# Patient Record
Sex: Female | Born: 1935 | Race: White | Hispanic: No | Marital: Married | Smoking: Former smoker
Health system: Southern US, Community
[De-identification: ages and names within clinical notes are randomized; demographics above are authoritative.]

## PROBLEM LIST (undated history)

## (undated) DIAGNOSIS — M1990 Unspecified osteoarthritis, unspecified site: Secondary | ICD-10-CM

## (undated) DIAGNOSIS — K5732 Diverticulitis of large intestine without perforation or abscess without bleeding: Secondary | ICD-10-CM

## (undated) DIAGNOSIS — Z9889 Other specified postprocedural states: Secondary | ICD-10-CM

## (undated) DIAGNOSIS — Z860100 Personal history of colon polyps, unspecified: Secondary | ICD-10-CM

## (undated) DIAGNOSIS — R112 Nausea with vomiting, unspecified: Secondary | ICD-10-CM

## (undated) DIAGNOSIS — J449 Chronic obstructive pulmonary disease, unspecified: Secondary | ICD-10-CM

## (undated) DIAGNOSIS — E785 Hyperlipidemia, unspecified: Secondary | ICD-10-CM

## (undated) DIAGNOSIS — N83202 Unspecified ovarian cyst, left side: Secondary | ICD-10-CM

## (undated) DIAGNOSIS — L409 Psoriasis, unspecified: Secondary | ICD-10-CM

## (undated) DIAGNOSIS — M4120 Other idiopathic scoliosis, site unspecified: Secondary | ICD-10-CM

## (undated) DIAGNOSIS — I714 Abdominal aortic aneurysm, without rupture, unspecified: Secondary | ICD-10-CM

## (undated) DIAGNOSIS — K648 Other hemorrhoids: Secondary | ICD-10-CM

## (undated) DIAGNOSIS — J9610 Chronic respiratory failure, unspecified whether with hypoxia or hypercapnia: Secondary | ICD-10-CM

## (undated) DIAGNOSIS — E278 Other specified disorders of adrenal gland: Secondary | ICD-10-CM

## (undated) DIAGNOSIS — M810 Age-related osteoporosis without current pathological fracture: Secondary | ICD-10-CM

## (undated) DIAGNOSIS — H409 Unspecified glaucoma: Secondary | ICD-10-CM

## (undated) DIAGNOSIS — K219 Gastro-esophageal reflux disease without esophagitis: Secondary | ICD-10-CM

## (undated) DIAGNOSIS — I10 Essential (primary) hypertension: Secondary | ICD-10-CM

## (undated) DIAGNOSIS — Z515 Encounter for palliative care: Secondary | ICD-10-CM

## (undated) HISTORY — DX: Gastro-esophageal reflux disease without esophagitis: K21.9

## (undated) HISTORY — PX: HEMIARTHROPLASTY SHOULDER FRACTURE: SUR653

## (undated) HISTORY — DX: Unspecified osteoarthritis, unspecified site: M19.90

## (undated) HISTORY — DX: Abdominal aortic aneurysm, without rupture: I71.4

## (undated) HISTORY — PX: ABDOMINAL AORTIC ANEURYSM REPAIR: SUR1152

## (undated) HISTORY — PX: APPENDECTOMY: SHX54

## (undated) HISTORY — DX: Chronic obstructive pulmonary disease, unspecified: J44.9

## (undated) HISTORY — DX: Personal history of colonic polyps: Z86.010

## (undated) HISTORY — DX: Abdominal aortic aneurysm, without rupture, unspecified: I71.40

## (undated) HISTORY — DX: Personal history of colon polyps, unspecified: Z86.0100

## (undated) HISTORY — DX: Encounter for palliative care: Z51.5

## (undated) HISTORY — PX: KIDNEY STONE SURGERY: SHX686

## (undated) HISTORY — PX: ABDOMINAL HYSTERECTOMY: SHX81

## (undated) HISTORY — PX: CATARACT EXTRACTION, BILATERAL: SHX1313

## (undated) HISTORY — DX: Unspecified ovarian cyst, left side: N83.202

## (undated) HISTORY — DX: Diverticulitis of large intestine without perforation or abscess without bleeding: K57.32

## (undated) HISTORY — DX: Hyperlipidemia, unspecified: E78.5

## (undated) HISTORY — DX: Essential (primary) hypertension: I10

## (undated) HISTORY — PX: TONSILLECTOMY: SUR1361

---

## 1997-04-16 ENCOUNTER — Encounter (INDEPENDENT_AMBULATORY_CARE_PROVIDER_SITE_OTHER): Payer: Self-pay | Admitting: Internal Medicine

## 2000-05-26 ENCOUNTER — Encounter: Payer: Self-pay | Admitting: Neurosurgery

## 2000-05-26 ENCOUNTER — Ambulatory Visit (HOSPITAL_COMMUNITY): Admission: RE | Admit: 2000-05-26 | Discharge: 2000-05-26 | Payer: Self-pay | Admitting: Neurosurgery

## 2000-06-17 ENCOUNTER — Other Ambulatory Visit: Admission: RE | Admit: 2000-06-17 | Discharge: 2000-06-17 | Payer: Self-pay | Admitting: *Deleted

## 2001-03-31 ENCOUNTER — Encounter: Payer: Self-pay | Admitting: Internal Medicine

## 2003-01-12 ENCOUNTER — Encounter: Payer: Self-pay | Admitting: Orthopedic Surgery

## 2003-01-18 ENCOUNTER — Encounter: Payer: Self-pay | Admitting: Orthopedic Surgery

## 2003-01-18 ENCOUNTER — Inpatient Hospital Stay (HOSPITAL_COMMUNITY): Admission: RE | Admit: 2003-01-18 | Discharge: 2003-01-20 | Payer: Self-pay | Admitting: Orthopedic Surgery

## 2003-11-09 ENCOUNTER — Inpatient Hospital Stay (HOSPITAL_COMMUNITY): Admission: EM | Admit: 2003-11-09 | Discharge: 2003-11-14 | Payer: Self-pay

## 2004-04-17 ENCOUNTER — Encounter: Admission: RE | Admit: 2004-04-17 | Discharge: 2004-04-17 | Payer: Self-pay | Admitting: Family Medicine

## 2004-04-17 ENCOUNTER — Ambulatory Visit: Payer: Self-pay | Admitting: Family Medicine

## 2004-04-19 ENCOUNTER — Ambulatory Visit: Payer: Self-pay | Admitting: Internal Medicine

## 2004-07-22 ENCOUNTER — Ambulatory Visit: Payer: Self-pay | Admitting: Family Medicine

## 2004-08-07 ENCOUNTER — Ambulatory Visit: Payer: Self-pay | Admitting: Family Medicine

## 2004-08-14 ENCOUNTER — Ambulatory Visit: Payer: Self-pay | Admitting: Family Medicine

## 2004-09-19 ENCOUNTER — Ambulatory Visit: Payer: Self-pay | Admitting: Family Medicine

## 2004-10-14 ENCOUNTER — Ambulatory Visit: Payer: Self-pay | Admitting: Family Medicine

## 2004-11-15 ENCOUNTER — Ambulatory Visit: Payer: Self-pay | Admitting: *Deleted

## 2005-01-19 ENCOUNTER — Emergency Department (HOSPITAL_COMMUNITY): Admission: EM | Admit: 2005-01-19 | Discharge: 2005-01-19 | Payer: Self-pay | Admitting: Emergency Medicine

## 2005-01-21 ENCOUNTER — Ambulatory Visit: Payer: Self-pay | Admitting: Family Medicine

## 2005-02-10 ENCOUNTER — Ambulatory Visit: Payer: Self-pay | Admitting: Critical Care Medicine

## 2005-03-14 ENCOUNTER — Ambulatory Visit: Payer: Self-pay | Admitting: Critical Care Medicine

## 2005-03-28 ENCOUNTER — Ambulatory Visit: Payer: Self-pay | Admitting: Critical Care Medicine

## 2005-05-15 ENCOUNTER — Ambulatory Visit: Payer: Self-pay | Admitting: Critical Care Medicine

## 2005-05-21 ENCOUNTER — Ambulatory Visit: Payer: Self-pay | Admitting: Family Medicine

## 2005-06-04 ENCOUNTER — Ambulatory Visit: Payer: Self-pay

## 2005-06-18 ENCOUNTER — Ambulatory Visit: Payer: Self-pay | Admitting: Internal Medicine

## 2005-06-18 ENCOUNTER — Inpatient Hospital Stay (HOSPITAL_COMMUNITY): Admission: AD | Admit: 2005-06-18 | Discharge: 2005-06-21 | Payer: Self-pay | Admitting: Internal Medicine

## 2005-06-18 ENCOUNTER — Ambulatory Visit: Payer: Self-pay | Admitting: Critical Care Medicine

## 2005-06-18 ENCOUNTER — Ambulatory Visit: Payer: Self-pay | Admitting: Family Medicine

## 2005-07-10 ENCOUNTER — Ambulatory Visit: Payer: Self-pay | Admitting: Critical Care Medicine

## 2005-09-09 ENCOUNTER — Ambulatory Visit: Payer: Self-pay | Admitting: Family Medicine

## 2005-09-25 ENCOUNTER — Ambulatory Visit: Payer: Self-pay | Admitting: Family Medicine

## 2005-10-30 ENCOUNTER — Ambulatory Visit: Payer: Self-pay | Admitting: Family Medicine

## 2005-12-19 ENCOUNTER — Ambulatory Visit: Payer: Self-pay | Admitting: Family Medicine

## 2006-01-09 ENCOUNTER — Ambulatory Visit: Payer: Self-pay | Admitting: Family Medicine

## 2006-03-17 ENCOUNTER — Ambulatory Visit: Payer: Self-pay | Admitting: Family Medicine

## 2006-03-31 ENCOUNTER — Ambulatory Visit: Payer: Self-pay | Admitting: Family Medicine

## 2006-04-03 ENCOUNTER — Ambulatory Visit: Payer: Self-pay | Admitting: Family Medicine

## 2006-05-15 ENCOUNTER — Ambulatory Visit: Payer: Self-pay | Admitting: Family Medicine

## 2006-06-19 ENCOUNTER — Ambulatory Visit: Payer: Self-pay | Admitting: Family Medicine

## 2006-08-05 ENCOUNTER — Ambulatory Visit: Payer: Self-pay | Admitting: Family Medicine

## 2006-09-01 ENCOUNTER — Ambulatory Visit: Payer: Self-pay | Admitting: Family Medicine

## 2006-11-05 ENCOUNTER — Ambulatory Visit: Payer: Self-pay | Admitting: Family Medicine

## 2006-11-05 ENCOUNTER — Encounter (INDEPENDENT_AMBULATORY_CARE_PROVIDER_SITE_OTHER): Payer: Self-pay | Admitting: Internal Medicine

## 2006-11-05 DIAGNOSIS — M25559 Pain in unspecified hip: Secondary | ICD-10-CM

## 2006-11-05 DIAGNOSIS — R109 Unspecified abdominal pain: Secondary | ICD-10-CM

## 2006-11-06 ENCOUNTER — Telehealth (INDEPENDENT_AMBULATORY_CARE_PROVIDER_SITE_OTHER): Payer: Self-pay | Admitting: Internal Medicine

## 2006-12-28 ENCOUNTER — Telehealth (INDEPENDENT_AMBULATORY_CARE_PROVIDER_SITE_OTHER): Payer: Self-pay | Admitting: *Deleted

## 2007-01-03 ENCOUNTER — Encounter: Admission: RE | Admit: 2007-01-03 | Discharge: 2007-01-03 | Payer: Self-pay | Admitting: Orthopedic Surgery

## 2007-01-13 ENCOUNTER — Ambulatory Visit (HOSPITAL_COMMUNITY): Admission: RE | Admit: 2007-01-13 | Discharge: 2007-01-13 | Payer: Self-pay | Admitting: Obstetrics and Gynecology

## 2007-01-20 ENCOUNTER — Ambulatory Visit: Payer: Self-pay | Admitting: Vascular Surgery

## 2007-01-26 ENCOUNTER — Ambulatory Visit: Payer: Self-pay | Admitting: Vascular Surgery

## 2007-02-11 ENCOUNTER — Encounter (INDEPENDENT_AMBULATORY_CARE_PROVIDER_SITE_OTHER): Payer: Self-pay | Admitting: Internal Medicine

## 2007-03-30 ENCOUNTER — Encounter (INDEPENDENT_AMBULATORY_CARE_PROVIDER_SITE_OTHER): Payer: Self-pay | Admitting: Internal Medicine

## 2007-03-30 DIAGNOSIS — I714 Abdominal aortic aneurysm, without rupture, unspecified: Secondary | ICD-10-CM | POA: Insufficient documentation

## 2007-03-30 DIAGNOSIS — M810 Age-related osteoporosis without current pathological fracture: Secondary | ICD-10-CM | POA: Insufficient documentation

## 2007-03-30 DIAGNOSIS — J4489 Other specified chronic obstructive pulmonary disease: Secondary | ICD-10-CM | POA: Insufficient documentation

## 2007-03-30 DIAGNOSIS — H409 Unspecified glaucoma: Secondary | ICD-10-CM | POA: Insufficient documentation

## 2007-03-30 DIAGNOSIS — M4120 Other idiopathic scoliosis, site unspecified: Secondary | ICD-10-CM

## 2007-03-30 DIAGNOSIS — M1990 Unspecified osteoarthritis, unspecified site: Secondary | ICD-10-CM | POA: Insufficient documentation

## 2007-03-30 DIAGNOSIS — I10 Essential (primary) hypertension: Secondary | ICD-10-CM | POA: Insufficient documentation

## 2007-03-30 DIAGNOSIS — J449 Chronic obstructive pulmonary disease, unspecified: Secondary | ICD-10-CM

## 2007-03-30 HISTORY — DX: Unspecified glaucoma: H40.9

## 2007-03-30 HISTORY — DX: Other idiopathic scoliosis, site unspecified: M41.20

## 2007-03-30 HISTORY — DX: Age-related osteoporosis without current pathological fracture: M81.0

## 2007-03-31 ENCOUNTER — Ambulatory Visit: Payer: Self-pay | Admitting: Family Medicine

## 2007-03-31 DIAGNOSIS — L408 Other psoriasis: Secondary | ICD-10-CM

## 2007-04-05 ENCOUNTER — Ambulatory Visit: Payer: Self-pay | Admitting: Family Medicine

## 2007-04-14 ENCOUNTER — Ambulatory Visit: Payer: Self-pay | Admitting: Family Medicine

## 2007-04-22 ENCOUNTER — Encounter (INDEPENDENT_AMBULATORY_CARE_PROVIDER_SITE_OTHER): Payer: Self-pay | Admitting: Internal Medicine

## 2007-04-30 ENCOUNTER — Ambulatory Visit: Payer: Self-pay | Admitting: Family Medicine

## 2007-05-04 DIAGNOSIS — E278 Other specified disorders of adrenal gland: Secondary | ICD-10-CM

## 2007-05-04 HISTORY — DX: Other specified disorders of adrenal gland: E27.8

## 2007-05-24 ENCOUNTER — Ambulatory Visit: Payer: Self-pay | Admitting: Family Medicine

## 2007-05-24 DIAGNOSIS — M545 Low back pain: Secondary | ICD-10-CM

## 2007-05-25 ENCOUNTER — Telehealth (INDEPENDENT_AMBULATORY_CARE_PROVIDER_SITE_OTHER): Payer: Self-pay | Admitting: *Deleted

## 2007-06-01 ENCOUNTER — Ambulatory Visit: Payer: Self-pay | Admitting: Family Medicine

## 2007-06-08 ENCOUNTER — Ambulatory Visit: Payer: Self-pay | Admitting: Family Medicine

## 2007-07-12 ENCOUNTER — Encounter (INDEPENDENT_AMBULATORY_CARE_PROVIDER_SITE_OTHER): Payer: Self-pay | Admitting: *Deleted

## 2007-07-26 ENCOUNTER — Ambulatory Visit: Payer: Self-pay | Admitting: Family Medicine

## 2007-08-17 ENCOUNTER — Telehealth (INDEPENDENT_AMBULATORY_CARE_PROVIDER_SITE_OTHER): Payer: Self-pay | Admitting: Internal Medicine

## 2007-09-21 ENCOUNTER — Ambulatory Visit: Payer: Self-pay | Admitting: Family Medicine

## 2007-10-05 ENCOUNTER — Ambulatory Visit: Payer: Self-pay | Admitting: Family Medicine

## 2007-10-11 ENCOUNTER — Ambulatory Visit: Payer: Self-pay | Admitting: Family Medicine

## 2007-11-16 ENCOUNTER — Encounter (INDEPENDENT_AMBULATORY_CARE_PROVIDER_SITE_OTHER): Payer: Self-pay | Admitting: Internal Medicine

## 2007-12-03 ENCOUNTER — Telehealth (INDEPENDENT_AMBULATORY_CARE_PROVIDER_SITE_OTHER): Payer: Self-pay | Admitting: *Deleted

## 2008-01-04 ENCOUNTER — Ambulatory Visit: Payer: Self-pay | Admitting: Family Medicine

## 2008-01-05 ENCOUNTER — Telehealth (INDEPENDENT_AMBULATORY_CARE_PROVIDER_SITE_OTHER): Payer: Self-pay | Admitting: Internal Medicine

## 2008-02-08 ENCOUNTER — Ambulatory Visit: Payer: Self-pay | Admitting: Vascular Surgery

## 2008-03-06 ENCOUNTER — Ambulatory Visit: Payer: Self-pay | Admitting: Family Medicine

## 2008-03-13 ENCOUNTER — Ambulatory Visit: Payer: Self-pay | Admitting: Family Medicine

## 2008-03-21 ENCOUNTER — Telehealth: Payer: Self-pay | Admitting: Internal Medicine

## 2008-03-21 ENCOUNTER — Telehealth (INDEPENDENT_AMBULATORY_CARE_PROVIDER_SITE_OTHER): Payer: Self-pay | Admitting: Internal Medicine

## 2008-03-21 ENCOUNTER — Ambulatory Visit: Payer: Self-pay | Admitting: Family Medicine

## 2008-03-23 DIAGNOSIS — K449 Diaphragmatic hernia without obstruction or gangrene: Secondary | ICD-10-CM | POA: Insufficient documentation

## 2008-03-23 DIAGNOSIS — K648 Other hemorrhoids: Secondary | ICD-10-CM | POA: Insufficient documentation

## 2008-03-23 HISTORY — DX: Other hemorrhoids: K64.8

## 2008-03-24 ENCOUNTER — Ambulatory Visit (HOSPITAL_COMMUNITY): Admission: RE | Admit: 2008-03-24 | Discharge: 2008-03-24 | Payer: Self-pay | Admitting: Family Medicine

## 2008-03-28 ENCOUNTER — Ambulatory Visit: Payer: Self-pay | Admitting: Family Medicine

## 2008-03-28 ENCOUNTER — Ambulatory Visit: Payer: Self-pay | Admitting: Internal Medicine

## 2008-03-28 DIAGNOSIS — R1319 Other dysphagia: Secondary | ICD-10-CM

## 2008-03-28 DIAGNOSIS — H6120 Impacted cerumen, unspecified ear: Secondary | ICD-10-CM

## 2008-03-29 ENCOUNTER — Encounter (INDEPENDENT_AMBULATORY_CARE_PROVIDER_SITE_OTHER): Payer: Self-pay | Admitting: Internal Medicine

## 2008-03-29 ENCOUNTER — Ambulatory Visit: Payer: Self-pay | Admitting: Internal Medicine

## 2008-03-29 DIAGNOSIS — K2980 Duodenitis without bleeding: Secondary | ICD-10-CM | POA: Insufficient documentation

## 2008-03-29 DIAGNOSIS — K2990 Gastroduodenitis, unspecified, without bleeding: Secondary | ICD-10-CM

## 2008-03-29 DIAGNOSIS — K2970 Gastritis, unspecified, without bleeding: Secondary | ICD-10-CM | POA: Insufficient documentation

## 2008-03-29 LAB — CONVERTED CEMR LAB: UREASE: NEGATIVE

## 2008-05-05 ENCOUNTER — Encounter (INDEPENDENT_AMBULATORY_CARE_PROVIDER_SITE_OTHER): Payer: Self-pay | Admitting: Internal Medicine

## 2008-05-16 ENCOUNTER — Encounter (INDEPENDENT_AMBULATORY_CARE_PROVIDER_SITE_OTHER): Payer: Self-pay | Admitting: Internal Medicine

## 2008-05-22 ENCOUNTER — Telehealth (INDEPENDENT_AMBULATORY_CARE_PROVIDER_SITE_OTHER): Payer: Self-pay | Admitting: *Deleted

## 2008-06-19 ENCOUNTER — Telehealth: Payer: Self-pay | Admitting: Family Medicine

## 2008-07-11 ENCOUNTER — Ambulatory Visit: Payer: Self-pay | Admitting: Family Medicine

## 2008-07-17 ENCOUNTER — Telehealth: Payer: Self-pay | Admitting: Family Medicine

## 2008-07-21 ENCOUNTER — Ambulatory Visit: Payer: Self-pay | Admitting: Internal Medicine

## 2008-07-21 DIAGNOSIS — R198 Other specified symptoms and signs involving the digestive system and abdomen: Secondary | ICD-10-CM

## 2008-07-24 ENCOUNTER — Telehealth: Payer: Self-pay | Admitting: Internal Medicine

## 2008-07-25 ENCOUNTER — Ambulatory Visit (HOSPITAL_COMMUNITY): Admission: RE | Admit: 2008-07-25 | Discharge: 2008-07-25 | Payer: Self-pay | Admitting: Internal Medicine

## 2008-07-26 ENCOUNTER — Ambulatory Visit (HOSPITAL_COMMUNITY): Admission: RE | Admit: 2008-07-26 | Discharge: 2008-07-26 | Payer: Self-pay | Admitting: Internal Medicine

## 2008-07-31 ENCOUNTER — Telehealth: Payer: Self-pay | Admitting: Family Medicine

## 2008-09-07 ENCOUNTER — Telehealth (INDEPENDENT_AMBULATORY_CARE_PROVIDER_SITE_OTHER): Payer: Self-pay | Admitting: Internal Medicine

## 2008-09-26 ENCOUNTER — Ambulatory Visit: Payer: Self-pay | Admitting: Vascular Surgery

## 2008-10-02 ENCOUNTER — Telehealth (INDEPENDENT_AMBULATORY_CARE_PROVIDER_SITE_OTHER): Payer: Self-pay | Admitting: *Deleted

## 2008-10-03 ENCOUNTER — Encounter: Payer: Self-pay | Admitting: Cardiology

## 2008-10-03 ENCOUNTER — Ambulatory Visit: Payer: Self-pay

## 2008-10-04 ENCOUNTER — Ambulatory Visit: Payer: Self-pay | Admitting: Critical Care Medicine

## 2008-10-10 ENCOUNTER — Encounter: Admission: RE | Admit: 2008-10-10 | Discharge: 2008-10-10 | Payer: Self-pay | Admitting: Vascular Surgery

## 2008-10-17 ENCOUNTER — Ambulatory Visit: Payer: Self-pay | Admitting: Vascular Surgery

## 2008-10-18 ENCOUNTER — Ambulatory Visit: Payer: Self-pay | Admitting: Family Medicine

## 2008-10-20 ENCOUNTER — Ambulatory Visit: Payer: Self-pay | Admitting: Critical Care Medicine

## 2008-10-23 ENCOUNTER — Encounter: Payer: Self-pay | Admitting: Critical Care Medicine

## 2008-11-01 ENCOUNTER — Inpatient Hospital Stay (HOSPITAL_COMMUNITY): Admission: RE | Admit: 2008-11-01 | Discharge: 2008-11-04 | Payer: Self-pay | Admitting: Vascular Surgery

## 2008-11-01 ENCOUNTER — Ambulatory Visit: Payer: Self-pay | Admitting: Vascular Surgery

## 2008-11-02 ENCOUNTER — Encounter: Payer: Self-pay | Admitting: Vascular Surgery

## 2008-11-29 ENCOUNTER — Encounter: Admission: RE | Admit: 2008-11-29 | Discharge: 2008-11-29 | Payer: Self-pay | Admitting: Vascular Surgery

## 2008-12-12 ENCOUNTER — Ambulatory Visit: Payer: Self-pay | Admitting: Vascular Surgery

## 2009-01-02 ENCOUNTER — Ambulatory Visit: Payer: Self-pay | Admitting: Family Medicine

## 2009-01-03 ENCOUNTER — Ambulatory Visit: Payer: Self-pay | Admitting: Family Medicine

## 2009-01-04 ENCOUNTER — Telehealth (INDEPENDENT_AMBULATORY_CARE_PROVIDER_SITE_OTHER): Payer: Self-pay | Admitting: Internal Medicine

## 2009-03-06 ENCOUNTER — Ambulatory Visit: Payer: Self-pay | Admitting: Family Medicine

## 2009-03-06 DIAGNOSIS — L821 Other seborrheic keratosis: Secondary | ICD-10-CM | POA: Insufficient documentation

## 2009-03-08 ENCOUNTER — Ambulatory Visit: Payer: Self-pay | Admitting: Family Medicine

## 2009-03-28 ENCOUNTER — Ambulatory Visit: Payer: Self-pay | Admitting: Family Medicine

## 2009-04-06 ENCOUNTER — Encounter (INDEPENDENT_AMBULATORY_CARE_PROVIDER_SITE_OTHER): Payer: Self-pay | Admitting: Internal Medicine

## 2009-04-23 ENCOUNTER — Telehealth: Payer: Self-pay | Admitting: Family Medicine

## 2009-04-26 ENCOUNTER — Telehealth (INDEPENDENT_AMBULATORY_CARE_PROVIDER_SITE_OTHER): Payer: Self-pay | Admitting: Internal Medicine

## 2009-05-15 ENCOUNTER — Ambulatory Visit: Payer: Self-pay | Admitting: Family Medicine

## 2009-05-17 ENCOUNTER — Ambulatory Visit: Payer: Self-pay | Admitting: Family Medicine

## 2009-06-19 ENCOUNTER — Ambulatory Visit: Payer: Self-pay | Admitting: Critical Care Medicine

## 2009-06-19 DIAGNOSIS — J209 Acute bronchitis, unspecified: Secondary | ICD-10-CM

## 2009-06-29 ENCOUNTER — Emergency Department (HOSPITAL_COMMUNITY): Admission: EM | Admit: 2009-06-29 | Discharge: 2009-06-29 | Payer: Self-pay | Admitting: Emergency Medicine

## 2009-07-02 ENCOUNTER — Ambulatory Visit: Payer: Self-pay | Admitting: Family Medicine

## 2009-07-02 DIAGNOSIS — B029 Zoster without complications: Secondary | ICD-10-CM | POA: Insufficient documentation

## 2009-07-11 ENCOUNTER — Ambulatory Visit: Payer: Self-pay | Admitting: Family Medicine

## 2009-07-11 DIAGNOSIS — J0110 Acute frontal sinusitis, unspecified: Secondary | ICD-10-CM | POA: Insufficient documentation

## 2009-07-18 ENCOUNTER — Encounter: Payer: Self-pay | Admitting: Critical Care Medicine

## 2009-07-20 ENCOUNTER — Ambulatory Visit: Payer: Self-pay | Admitting: Family Medicine

## 2009-07-24 ENCOUNTER — Ambulatory Visit: Payer: Self-pay | Admitting: Family Medicine

## 2009-08-01 ENCOUNTER — Ambulatory Visit: Payer: Self-pay | Admitting: Family Medicine

## 2009-08-01 LAB — CONVERTED CEMR LAB
ALT: 13 units/L (ref 0–35)
AST: 15 units/L (ref 0–37)
Albumin: 3.8 g/dL (ref 3.5–5.2)
BUN: 23 mg/dL (ref 6–23)
Calcium: 10 mg/dL (ref 8.4–10.5)
Cholesterol, target level: 200 mg/dL
Cholesterol: 200 mg/dL (ref 0–200)
GFR calc non Af Amer: 86.41 mL/min (ref >60)
Glucose, Bld: 96 mg/dL (ref 70–99)
HDL: 77.7 mg/dL (ref >39.00)
LDL Goal: 100 mg/dL
Phosphorus: 3.7 mg/dL (ref 2.3–4.6)
Sodium: 144 meq/L (ref 135–145)
Total Bilirubin: 0.6 mg/dL (ref 0.3–1.2)
Total CHOL/HDL Ratio: 3
Total Protein: 6.4 g/dL (ref 6.0–8.3)
Triglycerides: 187 mg/dL — ABNORMAL HIGH (ref 0.0–149.0)
VLDL: 37.4 mg/dL (ref 0.0–40.0)

## 2009-08-07 ENCOUNTER — Telehealth: Payer: Self-pay | Admitting: Family Medicine

## 2009-08-21 ENCOUNTER — Encounter: Admission: RE | Admit: 2009-08-21 | Discharge: 2009-08-21 | Payer: Self-pay | Admitting: Vascular Surgery

## 2009-08-21 ENCOUNTER — Ambulatory Visit: Payer: Self-pay | Admitting: Vascular Surgery

## 2009-09-12 ENCOUNTER — Ambulatory Visit: Payer: Self-pay | Admitting: Family Medicine

## 2009-09-13 ENCOUNTER — Encounter: Payer: Self-pay | Admitting: Family Medicine

## 2009-09-26 ENCOUNTER — Encounter: Payer: Self-pay | Admitting: Critical Care Medicine

## 2009-10-06 ENCOUNTER — Encounter: Payer: Self-pay | Admitting: Critical Care Medicine

## 2009-10-29 ENCOUNTER — Telehealth: Payer: Self-pay | Admitting: Family Medicine

## 2009-10-31 ENCOUNTER — Ambulatory Visit: Payer: Self-pay | Admitting: Family Medicine

## 2009-10-31 DIAGNOSIS — R5383 Other fatigue: Secondary | ICD-10-CM

## 2009-10-31 DIAGNOSIS — R5381 Other malaise: Secondary | ICD-10-CM | POA: Insufficient documentation

## 2009-11-01 LAB — CONVERTED CEMR LAB
AST: 18 units/L (ref 0–37)
Albumin: 4 g/dL (ref 3.5–5.2)
Basophils Relative: 0.6 % (ref 0.0–3.0)
Bilirubin, Direct: 0.1 mg/dL (ref 0.0–0.3)
CO2: 29 meq/L (ref 19–32)
Calcium: 9.7 mg/dL (ref 8.4–10.5)
Chloride: 106 meq/L (ref 96–112)
Creatinine, Ser: 0.7 mg/dL (ref 0.4–1.2)
Eosinophils Absolute: 0.2 10*3/uL (ref 0.0–0.7)
Glucose, Bld: 80 mg/dL (ref 70–99)
Lymphocytes Relative: 29.1 % (ref 12.0–46.0)
Lymphs Abs: 1.7 10*3/uL (ref 0.7–4.0)
Monocytes Relative: 12.3 % — ABNORMAL HIGH (ref 3.0–12.0)
Neutro Abs: 3.2 10*3/uL (ref 1.4–7.7)
Platelets: 272 10*3/uL (ref 150.0–400.0)
Sed Rate: 8 mm/hr (ref 0–22)

## 2009-11-27 ENCOUNTER — Telehealth: Payer: Self-pay | Admitting: Family Medicine

## 2009-11-28 ENCOUNTER — Telehealth: Payer: Self-pay | Admitting: Family Medicine

## 2009-12-21 ENCOUNTER — Encounter: Payer: Self-pay | Admitting: Family Medicine

## 2009-12-31 ENCOUNTER — Ambulatory Visit: Payer: Self-pay | Admitting: Family Medicine

## 2010-01-17 ENCOUNTER — Ambulatory Visit: Admission: RE | Admit: 2010-01-17 | Discharge: 2010-01-17 | Payer: Self-pay | Admitting: Gynecologic Oncology

## 2010-02-04 ENCOUNTER — Ambulatory Visit: Payer: Self-pay | Admitting: Family Medicine

## 2010-02-11 ENCOUNTER — Encounter: Payer: Self-pay | Admitting: Family Medicine

## 2010-02-13 ENCOUNTER — Ambulatory Visit: Payer: Self-pay | Admitting: Vascular Surgery

## 2010-02-15 ENCOUNTER — Telehealth: Payer: Self-pay | Admitting: Family Medicine

## 2010-03-05 ENCOUNTER — Ambulatory Visit: Payer: Self-pay | Admitting: Family Medicine

## 2010-03-19 ENCOUNTER — Telehealth: Payer: Self-pay | Admitting: Family Medicine

## 2010-04-01 ENCOUNTER — Ambulatory Visit: Payer: Self-pay | Admitting: Family Medicine

## 2010-04-11 ENCOUNTER — Ambulatory Visit: Payer: Self-pay | Admitting: Family Medicine

## 2010-04-11 DIAGNOSIS — R079 Chest pain, unspecified: Secondary | ICD-10-CM | POA: Insufficient documentation

## 2010-04-29 ENCOUNTER — Telehealth: Payer: Self-pay | Admitting: Family Medicine

## 2010-05-02 ENCOUNTER — Encounter: Payer: Self-pay | Admitting: Family Medicine

## 2010-05-27 ENCOUNTER — Ambulatory Visit: Payer: Self-pay | Admitting: Family Medicine

## 2010-07-01 ENCOUNTER — Telehealth: Payer: Self-pay | Admitting: Family Medicine

## 2010-07-07 ENCOUNTER — Encounter: Payer: Self-pay | Admitting: Vascular Surgery

## 2010-07-12 ENCOUNTER — Ambulatory Visit: Admit: 2010-07-12 | Payer: Self-pay | Admitting: Family Medicine

## 2010-07-16 NOTE — Assessment & Plan Note (Signed)
Summary: F/U shingles   Vital Signs:  Patient profile:   77 year old female Height:      60 inches Weight:      124 pounds BMI:     24.30 Temp:     98 degrees F oral Pulse rate:   92 / minute Pulse rhythm:   regular BP sitting:   166 / 100  (left arm) Cuff size:   regular  Vitals Entered By: Delilah Shan CMA Duncan Dull) (July 24, 2009 9:41 AM) CC: follow-up visit   History of Present Illness: 77 yo here with for follow up Shingles.  Seen on 1/17 for painful, burning  rash on right  lower abdomen. Given Vatrex, which she did not take because one dose upset her stomach. Rash is now gone but has Rosenberger has episodes of sharp, tingling burning sensation on the skin. No dysuria, no nausea, no vomiting, no diarrhea. No fevers. No hematuria.  Bronchitis symptoms much improved since I saw her last week.  Current Medications (verified): 1)  Nexium 40 Mg Cpdr (Esomeprazole Magnesium) .... Take One By Mouth Once A Day 2)  Advair Diskus 250-50 Mcg/dose Misc (Fluticasone-Salmeterol) .... Use One Time A Day 3)  Calcium 500 Mg Tabs (Calcium) .... Once Weekly 4)  Centrum Silver  Tabs (Multiple Vitamins-Minerals) .... Once Weekly 5)  Ultravate 0.05 %  Oint (Halobetasol Propionate) .... As Needed 6)  Cozaar 100 Mg  Tabs (Losartan Potassium) .... One Daily 7)  Antivert 25 Mg Tabs (Meclizine Hcl) .... One Tab By Mouth Every 6 Hrs As Needed Dizziness 8)  Atrovent 0.03 % Soln (Ipratropium Bromide) .... Use One in Nebulizer Four Times Daily 9)  Garlique 400 Mg Tbec (Garlic) .[name].. 1 By Mouth Daily 10)  Lipitor 10 Mg Tabs (Atorvastatin Calcium) .[name].. 1 Daily For Cholesterol 11)  Xanax 0.5 Mg Tabs (Alprazolam) .[name].. 1 At Bedtime As Needed 12)  Vicodin 5-500 Mg Tabs (Hydrocodone-Acetaminophen) .[name].. 1 Every 4 Hrs As Needed Cough 13)  Albuterol Sulfate (2.5 Mg/41ml) 0.083% Nebu (Albuterol Sulfate) .... Use in Nebulizer Four Times Daily With Atrovent. 14)  Valtrex 1 Gm Tabs (Valacyclovir Hcl) .[name].. 1 Tab  Three Times A Day For 7 Days 15)  Percocet 5-325 Mg Tabs (Oxycodone-Acetaminophen) .[name].. 1 Tab Every 6 Hours As Needed For Pain 16)  Vicodin 5-500 Mg Tabs (Hydrocodone-Acetaminophen) .[name].. 1 Tab Every 4 Hours As Needed Cough. 17)  Prednisone 10 Mg Tabs (Prednisone) .... 5 Tabs By Mouth X 1 Day, Then 4 Tabs By Mouth X 3 Days, Then 3 Tabs By Mouth X 3 Days, 2 Tabs By Mouth 2 Days, Then 1 Tab For 1 Day.  Allergies: 1)  ! * Avelox 2)  ! Sulfa 3)  ! Biaxin 4)  ! * Vicodin Tuss 5)  ! * Contrast Dye  Review of Systems      See HPI General:  Denies chills and fever. Derm:  Denies rash.  Physical Exam  General:  alert, well-developed, well-nourished, and well-hydrated. no increased WOB. VS reviewed- afebrile and hypertensive (was normal last office visit). Lungs:  normal respiratory effort, no intercostal retractions, and no accessory muscle use. no wheezes, much improved! Heart:  normal rate, regular rhythm, and no murmur.   Skin:  Rash no longer visible on abdomen, Baney sensitive to touch Psych:  normally interactive, moderately anxious   Impression & Recommendations:  Problem # 1:  SHINGLES (ICD-053.9) Assessment Improved Rash improved, having episodes of postherpetic neuralgia. Time spent with patient 25 minutes, more than  50% of this time was spent counseling patient on different options for treating this pain.  I would like to try Gabapentin but she is very nervous about starting another medication.  Was given Hydroconde (10 pills) in January, uses it only when the pain is severe, has two pills left.  Would like to take this for pain instead.  Given that she does not abuse them and is aware of side effects, I am ok with continuing this on short term basis as she is obviously in pain.  Problem # 2:  HYPERTENSION (ICD-401.9) Assessment: Deteriorated previously controlled see (last week OV note), likely deteriorated secondary to pain.  See #1.  Advised to keep checking it at home. Her  updated medication list for this problem includes:    Cozaar 100 Mg Tabs (Losartan potassium) ..... One daily  Complete Medication List: 1)  Nexium 40 Mg Cpdr (Esomeprazole magnesium) .... Take one by mouth once a day 2)  Advair Diskus 250-50 Mcg/dose Misc (Fluticasone-salmeterol) .... Use one time a day 3)  Calcium 500 Mg Tabs (Calcium) .... Once weekly 4)  Centrum Silver Tabs (Multiple vitamins-minerals) .... Once weekly 5)  Ultravate 0.05 % Oint (Halobetasol propionate) .... As needed 6)  Cozaar 100 Mg Tabs (Losartan potassium) .... One daily 7)  Antivert 25 Mg Tabs (Meclizine hcl) .... One tab by mouth every 6 hrs as needed dizziness 8)  Atrovent 0.03 % Soln (Ipratropium bromide) .... Use one in nebulizer four times daily 9)  Garlique 400 Mg Tbec (Garlic) .[name].. 1 by mouth daily 10)  Lipitor 10 Mg Tabs (Atorvastatin calcium) .[name].. 1 daily for cholesterol 11)  Xanax 0.5 Mg Tabs (Alprazolam) .[name].. 1 at bedtime as needed 12)  Vicodin 5-500 Mg Tabs (Hydrocodone-acetaminophen) .[name].. 1 every 4 hrs as needed cough 13)  Albuterol Sulfate (2.5 Mg/35ml) 0.083% Nebu (Albuterol sulfate) .... Use in nebulizer four times daily with atrovent. 14)  Valtrex 1 Gm Tabs (Valacyclovir hcl) .[name].. 1 tab three times a day for 7 days 15)  Percocet 5-325 Mg Tabs (Oxycodone-acetaminophen) .[name].. 1 tab every 6 hours as needed for pain 16)  Vicodin 5-500 Mg Tabs (Hydrocodone-acetaminophen) .[name].. 1 tab every 4 hours as needed cough. 17)  Prednisone 10 Mg Tabs (Prednisone) .... 5 tabs by mouth x 1 day, then 4 tabs by mouth x 3 days, then 3 tabs by mouth x 3 days, 2 tabs by mouth 2 days, then 1 tab for 1 day. Prescriptions: PERCOCET 5-325 MG TABS (OXYCODONE-ACETAMINOPHEN) 1 tab every 6 hours as needed for pain  #20 x 0   Entered and Authorized by:   [name] MD   Signed by:   [name] MD on 07/24/2009   Method used:   Print then Give to Patient   RxID:   [id number]   Current Allergies (reviewed today): ! *  AVELOX ! SULFA ! BIAXIN ! * VICODIN TUSS ! * CONTRAST DYE

## 2010-07-16 NOTE — Progress Notes (Signed)
Summary: nebulizer  Phone Note Call from Patient   Caller: Ed with [facility] Summary of Call: [facility] is asking for a rx for a new nebulizer to be faxed to [facility]. The patient old nebulizer is no longer working. Fax number is [phone]. Initial call taken by: Melody Comas,  November 28, 2009 9:14 AM     Appended Document: nebulizer Rx for nebulizer faxed attention Ed to [phone].

## 2010-07-16 NOTE — Miscellaneous (Signed)
Summary: Xopenex changed to Albuterol  Medications Added ALBUTEROL SULFATE (2.5 MG/3ML) 0.083% NEBU (ALBUTEROL SULFATE) use in nebulizer four times daily with atrovent.       Clinical Lists Changes  Medications: Changed medication from XOPENEX 0.63 MG/3ML NEBU (LEVALBUTEROL HCL) Use in nebulzer three times daily to ALBUTEROL SULFATE (2.5 MG/3ML) 0.083% NEBU (ALBUTEROL SULFATE) use in nebulizer four times daily with atrovent. - Signed Rx of ALBUTEROL SULFATE (2.5 MG/3ML) 0.083% NEBU (ALBUTEROL SULFATE) use in nebulizer four times daily with atrovent.;  #120 x 4;  Signed;  Entered by: Gweneth Dimitri RN;  Authorized by: Storm Frisk MD;  Method used: Electronically to [facility] [id number], [place], [place], [place]  [address], Ph: [phone], Fax: [phone]    Prescriptions: ALBUTEROL SULFATE (2.5 MG/3ML) 0.083% NEBU (ALBUTEROL SULFATE) use in nebulizer four times daily with atrovent.  #120 x 4   Entered by:   Gweneth Dimitri RN   Authorized by:   Storm Frisk MD   Signed by:   Gweneth Dimitri RN on 07/18/2009   Method used:   Electronically to        [facility] [id number] (retail)       [address] [place], [place]  [address]       Ph: [phone]       Fax: [phone]   RxID:   [id number]  Received prior auth request from [facility] for xopenex.  Per PW, pt may stay with DuoNeb and change xopenex to albuterol to make duoneb. Prior auth request/Med change sent to be scanned into EMR. Gweneth Dimitri RN  July 18, 2009 12:05 PM

## 2010-07-16 NOTE — Assessment & Plan Note (Signed)
Summary: NEED TO DISCUSS MEDICATION CHANGE   Vital Signs:  Patient profile:   77 year old female Height:      60 inches Weight:      123.25 pounds BMI:     24.16 Temp:     97.9 degrees F oral Pulse rate:   84 / minute Pulse rhythm:   regular BP sitting:   156 / 90  (left arm) Cuff size:   regular  Vitals Entered By: Delilah Shan CMA Duncan Dull) (August 01, 2009 9:29 AM) CC: Need to discuss medication change, Lipid Management   History of Present Illness: 77 yo here to discuss medication change.  Insurance company sent her a letter saying they would no longer cover Cozaar.  BP- did not take meds this morning.  She has been on Cozaar 100 mg for a while and concerned about changing to another medication.  This is worsening her anxiety.    COPD- much better since last visit.  Feels her breathing is much improved.    HLD- due for FLP, LFTs.       Lipid Management History:      Positive NCEP/ATP III risk factors include female age 12 years old or older, hypertension, peripheral vascular disease, and aortic aneurysm.  Negative NCEP/ATP III risk factors include no family history for ischemic heart disease and non-tobacco-user status.     Current Medications (verified): 1)  Nexium 40 Mg Cpdr (Esomeprazole Magnesium) .... Take One By Mouth Once A Day 2)  Advair Diskus 250-50 Mcg/dose Misc (Fluticasone-Salmeterol) .... Use One Time A Day 3)  Calcium 500 Mg Tabs (Calcium) .... Once Weekly 4)  Centrum Silver  Tabs (Multiple Vitamins-Minerals) .... Once Weekly 5)  Ultravate 0.05 %  Oint (Halobetasol Propionate) .... As Needed 6)  Antivert 25 Mg Tabs (Meclizine Hcl) .... One Tab By Mouth Every 6 Hrs As Needed Dizziness 7)  Atrovent 0.03 % Soln (Ipratropium Bromide) .... Use One in Nebulizer Four Times Daily 8)  Garlique 400 Mg Tbec (Garlic) .[name].. 1 By Mouth Daily 9)  Lipitor 10 Mg Tabs (Atorvastatin Calcium) .[name].. 1 Daily For Cholesterol 10)  Xanax 0.5 Mg Tabs (Alprazolam) .[name].. 1 At Bedtime  As Needed 11)  Vicodin 5-500 Mg Tabs (Hydrocodone-Acetaminophen) .[name].. 1 Every 4 Hrs As Needed Cough 12)  Albuterol Sulfate (2.5 Mg/1ml) 0.083% Nebu (Albuterol Sulfate) .... Use in Nebulizer Four Times Daily With Atrovent. 13)  Valtrex 1 Gm Tabs (Valacyclovir Hcl) .[name].. 1 Tab Three Times A Day For 7 Days 14)  Percocet 5-325 Mg Tabs (Oxycodone-Acetaminophen) .[name].. 1 Tab Every 6 Hours As Needed For Pain 15)  Vicodin 5-500 Mg Tabs (Hydrocodone-Acetaminophen) .[name].. 1 Tab Every 4 Hours As Needed Cough. 16)  Prednisone 10 Mg Tabs (Prednisone) .... 5 Tabs By Mouth X 1 Day, Then 4 Tabs By Mouth X 3 Days, Then 3 Tabs By Mouth X 3 Days, 2 Tabs By Mouth 2 Days, Then 1 Tab For 1 Day. 17)  Losartan Potassium 100 Mg Tabs (Losartan Potassium) .[name].. 1 Tab By Mouth Daily.  Allergies: 1)  ! * Avelox 2)  ! Sulfa 3)  ! Biaxin 4)  ! * Vicodin Tuss 5)  ! * Contrast Dye  Review of Systems      See HPI General:  Complains of malaise; denies chills and fever. Eyes:  Denies blurring. ENT:  Denies difficulty swallowing. CV:  Denies chest pain or discomfort. Resp:  Denies shortness of breath. GI:  Denies abdominal pain. MS:  Denies muscle aches. Derm:  Denies rash. Psych:  Complains of anxiety; denies depression.  Physical Exam  General:  alert, well-developed, well-nourished, and well-hydrated. no increased WOB. VS reviewed- afebrile and hypertensive  Eyes:  pupils equal, pupils round, and no injection.   Nose:  External nasal examination shows no deformity or inflammation. Nasal mucosa are pink and moist without lesions or exudates. Mouth:  Oral mucosa and oropharynx without lesions or exudates.  Teeth in good repair. Lungs:  normal respiratory effort, no intercostal retractions, and no accessory muscle use. no wheezes, much improved! Heart:  normal rate, regular rhythm, and no murmur.   Extremities:  no edema either lower legs Psych:  normally interactive, moderately anxious   Impression &  Recommendations:  Problem # 1:  HYPERTENSION (ICD-401.9) Assessment Deteriorated Advised taking medication at soon as she got home. Spent 20 minutes discussing options of medications her insurance plan would cover based on the booklet she brought to me.  We will try Losartan 100 mg daily.  This decreased her anxiety about changing medications.   The following medications were removed from the medication list:    Cozaar 100 Mg Tabs (Losartan potassium) ..... One daily    Losartan Potassium 50 Mg Tabs (Losartan potassium) .[name]... 1 tab by mouth daily.30 Her updated medication list for this problem includes:    Losartan Potassium 100 Mg Tabs (Losartan potassium) .[name]... 1 tab by mouth daily.  Orders: Venipuncture ([address]) TLB-Renal Function Panel (80069-RENAL) TLB-BMP (Basic Metabolic Panel-BMET) (80048-METABOL) Prescription Created Electronically [phone])  Problem # 2:  HYPERLIPIDEMIA (ICD-272.4) Assessment: Unchanged Recheck FLP, LFTs today.  Continue lipitor at current dosage. Her updated medication list for this problem includes:    Lipitor 10 Mg Tabs (Atorvastatin calcium) .[name]... 1 daily for cholesterol  Orders: TLB-Hepatic/Liver Function Pnl (80076-HEPATIC) TLB-Lipid Panel (80061-LIPID)  Problem # 3:  COPD (ICD-496) Assessment: Unchanged Stable, lungs sound clear!  continue Advair. Her updated medication list for this problem includes:    Advair Diskus 250-50 Mcg/dose Misc (Fluticasone-salmeterol) ..... Use one time a day    Albuterol Sulfate (2.5 Mg/1ml) 0.083% Nebu (Albuterol sulfate) ..... Use in nebulizer four times daily with atrovent.  Complete Medication List: 1)  Nexium 40 Mg Cpdr (Esomeprazole magnesium) .... Take one by mouth once a day 2)  Advair Diskus 250-50 Mcg/dose Misc (Fluticasone-salmeterol) .... Use one time a day 3)  Calcium 500 Mg Tabs (Calcium) .... Once weekly 4)  Centrum Silver Tabs (Multiple vitamins-minerals) .... Once weekly 5)  Ultravate 0.05 % Oint  (Halobetasol propionate) .... As needed 6)  Antivert 25 Mg Tabs (Meclizine hcl) .... One tab by mouth every 6 hrs as needed dizziness 7)  Atrovent 0.03 % Soln (Ipratropium bromide) .... Use one in nebulizer four times daily 8)  Garlique 400 Mg Tbec (Garlic) .[name].. 1 by mouth daily 9)  Lipitor 10 Mg Tabs (Atorvastatin calcium) .[name].. 1 daily for cholesterol 10)  Xanax 0.5 Mg Tabs (Alprazolam) .[name].. 1 at bedtime as needed 11)  Vicodin 5-500 Mg Tabs (Hydrocodone-acetaminophen) .[name].. 1 every 4 hrs as needed cough 12)  Albuterol Sulfate (2.5 Mg/17ml) 0.083% Nebu (Albuterol sulfate) .... Use in nebulizer four times daily with atrovent. 13)  Valtrex 1 Gm Tabs (Valacyclovir hcl) .[name].. 1 tab three times a day for 7 days 14)  Percocet 5-325 Mg Tabs (Oxycodone-acetaminophen) .[name].. 1 tab every 6 hours as needed for pain 15)  Vicodin 5-500 Mg Tabs (Hydrocodone-acetaminophen) .[name].. 1 tab every 4 hours as needed cough. 16)  Prednisone 10 Mg Tabs (Prednisone) .... 5 tabs by  mouth x 1 day, then 4 tabs by mouth x 3 days, then 3 tabs by mouth x 3 days, 2 tabs by mouth 2 days, then 1 tab for 1 day. 17)  Losartan Potassium 100 Mg Tabs (Losartan potassium) .[name].. 1 tab by mouth daily.  Lipid Assessment/Plan:      Based on NCEP/ATP III, the patient's risk factor category is "history of coronary disease, peripheral vascular disease, cerebrovascular disease, or aortic aneurysm".  The patient's lipid goals are as follows: Total cholesterol goal is 200; LDL cholesterol goal is 100; HDL cholesterol goal is 40; Triglyceride goal is 150.   Prescriptions: LOSARTAN POTASSIUM 100 MG TABS (LOSARTAN POTASSIUM) 1 tab by mouth daily.  #30 x 3   Entered and Authorized by:   [name] MD   Signed by:   [name] MD on 08/01/2009   Method used:   Electronically to        [facility] (retail)       [address]       [place], [place]  [address]       Ph: [phone] or [phone]       Fax: [phone]   RxID:    [id number] LOSARTAN POTASSIUM 50 MG TABS (LOSARTAN POTASSIUM) 1 tab by mouth daily.30  #30 x 3   Entered and Authorized by:   [name] MD   Signed by:   [name] MD on 08/01/2009   Method used:   [facility]        [facility] (retail)       [address]       [place], [place]  [address]       Ph: [phone] or [phone]       Fax: [phone]   RxID:   [id number]   Current Allergies (reviewed today): ! * AVELOX ! SULFA ! BIAXIN ! * VICODIN TUSS ! * CONTRAST DYE  Flex Sig Next Due:  Not Indicated Hemoccult Next Due:  Not Indicated Last PAP:  Normal/GYN (04/16/1997 1:39:26 PM) PAP Next Due:  Not Indicated

## 2010-07-16 NOTE — Progress Notes (Signed)
Summary: needs to see ortho  Phone Note Call from Patient Call back at Home Phone [phone]   Caller: Patient Call For: [name] MD Summary of Call: Pt is asking for referral to Dr. Kellie Simmering for her arthritis pain.  I advised her that he is a rheumatologist and what she has is osteoarthritis.  I told her she might rather see an orthopaedic.  She said she will call Dr. Madelon Lips, who she is already established with.  Initial call taken by: Lowella Petties CMA, AAMA,  April 29, 2010 4:06 PM  Follow-up for Phone Call        thank you. [name] MD  April 30, 2010 7:22 AM

## 2010-07-16 NOTE — Assessment & Plan Note (Signed)
Summary: COUGH,RUNNY NOSE/CLE   Vital Signs:  Patient profile:   77 year old female Height:      60 inches Weight:      128 pounds BMI:     25.09 O2 Sat:      94 % on Room air Temp:     98.1 degrees F oral Pulse rate:   92 / minute Pulse rhythm:   regular BP sitting:   150 / 90  (right arm) Cuff size:   regular  Vitals Entered By: Linde Gillis CMA Duncan Dull) (April 11, 2010 12:14 PM)  O2 Flow:  Room air CC: cough   History of Present Illness: 77 yo here for persistent cough.  Seen on 10/17 treated for COPD exacerbation the Zpack, prednisone taper.  Feels better overall but Achord coughing quite a bit and now has severe pain on her left side when she coughs or takes deep breaths.  Strutz wheezing but no fevers. No SOB except when coughing.     Current Medications (verified): 1)  Nexium 40 Mg Cpdr (Esomeprazole Magnesium) .... Take One By Mouth Once A Day 2)  Advair Diskus 250-50 Mcg/dose Misc (Fluticasone-Salmeterol) .... Use One Time A Day 3)  Centrum Silver  Tabs (Multiple Vitamins-Minerals) .... Once Weekly 4)  Ultravate 0.05 %  Oint (Halobetasol Propionate) .... As Needed 5)  Antivert 25 Mg Tabs (Meclizine Hcl) .... One Tab By Mouth Every 6 Hrs As Needed Dizziness 6)  Garlique 400 Mg Tbec (Garlic) .[name].. 1 By Mouth Daily 7)  Lipitor 10 Mg Tabs (Atorvastatin Calcium) .[name].. 1 Daily For Cholesterol 8)  Xanax 0.5 Mg Tabs (Alprazolam) .[name].. 1 At Bedtime As Needed 9)  Albuterol Sulfate (2.5 Mg/62ml) 0.083% Nebu (Albuterol Sulfate) .... Use in Nebulizer Four Times Daily With Atrovent. 10)  Percocet 5-325 Mg Tabs (Oxycodone-Acetaminophen) .[name].. 1 Tab Every 6 Hours As Needed For Pain 11)  Losartan Potassium 100 Mg Tabs (Losartan Potassium) .[name].. 1 Tab By Mouth Daily. 12)  Ipratropium-Albuterol 0.5-2.5 (3) Mg/66ml Soln (Ipratropium-Albuterol) .... Use As Directed. 13)  Tramadol Hcl 50 Mg  Tabs (Tramadol Hcl) .[name].. 1 Tab By Mouth Two Times A Day As Needed Pain 14)  Vicodin 5-500 Mg Tabs  (Hydrocodone-Acetaminophen) .[name].. 1 Tab Every 6 Hours As Needed For Cough or Pain 15)  Zithromax 250 Mg Tabs (Azithromycin) .... 2 By Mouth Today and Then 1 By Mouth Once Daily For 4 Days. 16)  Tussionex Pennkinetic Er 10-8 Mg/21ml Lqcr (Hydrocod Polst-Chlorphen Polst) .... 5ml By Mouth Two Times A Day For Cough With Sedation Caution 17)  Prednisone 10 Mg Tabs (Prednisone) .... 5 By Mouth X3 Days, 4po X3 Days, 3po X3days, 2po X2days, 1po X1days, Take With Food. 18)  Cheratussin Ac 100-10 Mg/27ml Syrp (Guaifenesin-Codeine) .... 5 Ml By Mouth As Needed Cough.  Allergies: 1)  ! * Avelox 2)  ! Sulfa 3)  ! Biaxin 4)  ! * Vicodin Tuss 5)  ! * Contrast Dye  Past History:  Past Medical History: Last updated: 06/19/2009 Colonic polyps, hx of COPD Diverticulosis, colon GERD Hypertension Osteoarthritis Hyperlipidemia aortic anursym in abdomen ovarian cyst L ovary AAA  Past Surgical History: Last updated: 06/19/2009 Hysterectomy--1975--ovaries intact Kidney surgery- stones Appendectomy Tonsillectomy DEXA + osteoporosis-- 04/01 EGD-- 9/98---03/29/08--stricture of esophagus and duodenitis wihtout hemorrhage Colonoscopy 9/98 -- 10/02 Left shoulder hemiarthroplasty-- 01/18/03 Carotid doppler 39% ICA --12/06 Right cataract-- 09/07--L cataract 6/09 CT Abd/pelvis- 2 renal stones, AAA-- 05/19/06 BA swallow--02/28/08--stricture AAA endovascular surgery 10/23/08  Family History: Last updated: 10/23/2008 Father: Deceased, [place]  Mother: Deceased, [place] Siblings: One brother cancer- pancreas, 3 sisters CV + BP + Cancer + Gout/arthritis + DM - none Father DVT Family History Colon Cancer brother  Social History: Last updated: 03/28/2008 Marital Status: Married Children:  Occupation: retired Patient is a former smoker.  Alcohol Use - no Daily Caffeine Use 1/2 cup coffee every am and Diet Sprite during the day  Risk Factors: Smoking Status: quit > 6 months (06/19/2009)  Review of  Systems      See HPI General:  Denies fever. Resp:  Complains of cough, sputum productive, and wheezing; denies shortness of breath.  Physical Exam  General:  GEN: nad, alert and oriented HEENT: mucous membranes moist, TM wnl, no OP exudates NECK: supple w/o LA CV: rrr.   PULM:  no inc wob but scattered exp wheeze and pseudowheeze with forces expiration. Chest wall- TTP over 4-5 left rib ABD: soft, +bs EXT: no edema SKIN: no acute rash    Impression & Recommendations:  Problem # 1:  RIB PAIN, LEFT SIDED (ICD-786.50) Assessment New Likely rib fracture. Reassurance provided as lungs sound better than they did prior to the Zpack and prednisone. Will treat with Cheratussin to help with cough and pain.  Complete Medication List: 1)  Nexium 40 Mg Cpdr (Esomeprazole magnesium) .... Take one by mouth once a day 2)  Advair Diskus 250-50 Mcg/dose Misc (Fluticasone-salmeterol) .... Use one time a day 3)  Centrum Silver Tabs (Multiple vitamins-minerals) .... Once weekly 4)  Ultravate 0.05 % Oint (Halobetasol propionate) .... As needed 5)  Antivert 25 Mg Tabs (Meclizine hcl) .... One tab by mouth every 6 hrs as needed dizziness 6)  Garlique 400 Mg Tbec (Garlic) .[name].. 1 by mouth daily 7)  Lipitor 10 Mg Tabs (Atorvastatin calcium) .[name].. 1 daily for cholesterol 8)  Xanax 0.5 Mg Tabs (Alprazolam) .[name].. 1 at bedtime as needed 9)  Albuterol Sulfate (2.5 Mg/77ml) 0.083% Nebu (Albuterol sulfate) .... Use in nebulizer four times daily with atrovent. 10)  Percocet 5-325 Mg Tabs (Oxycodone-acetaminophen) .[name].. 1 tab every 6 hours as needed for pain 11)  Losartan Potassium 100 Mg Tabs (Losartan potassium) .[name].. 1 tab by mouth daily. 12)  Ipratropium-albuterol 0.5-2.5 (3) Mg/64ml Soln (Ipratropium-albuterol) .... Use as directed. 13)  Tramadol Hcl 50 Mg Tabs (Tramadol hcl) .[name].. 1 tab by mouth two times a day as needed pain 14)  Vicodin 5-500 Mg Tabs (Hydrocodone-acetaminophen) .[name].. 1 tab every 6 hours as  needed for cough or pain 15)  Zithromax 250 Mg Tabs (Azithromycin) .... 2 by mouth today and then 1 by mouth once daily for 4 days. 16)  Tussionex Pennkinetic Er 10-8 Mg/42ml Lqcr (Hydrocod polst-chlorphen polst) .... 5ml by mouth two times a day for cough with sedation caution 17)  Prednisone 10 Mg Tabs (Prednisone) .... 5 by mouth x3 days, 4po x3 days, 3po x3days, 2po x2days, 1po x1days, take with food. 18)  Cheratussin Ac 100-10 Mg/2ml Syrp (Guaifenesin-codeine) .... 5 ml by mouth as needed cough. Prescriptions: XANAX 0.5 MG TABS (ALPRAZOLAM) 1 at bedtime as needed Brand medically necessary #90 x 0   Entered and Authorized by:   [name] MD   Signed by:   [name] MD on 04/11/2010   Method used:   Print then Give to Patient   RxID:   [id number] NEXIUM 40 MG CPDR (ESOMEPRAZOLE MAGNESIUM) Take one by mouth once a day  #30 Capsule x 12   Entered and Authorized by:   [name] MD   Signed  by:   [name] MD on 04/11/2010   Method used:   Print then Give to Patient   RxID:   [id number] CHERATUSSIN AC 100-10 MG/5ML SYRP (GUAIFENESIN-CODEINE) 5 ml by mouth as needed cough.  #5 ounces x 0   Entered and Authorized by:   [name] MD   Signed by:   [name] MD on 04/11/2010   Method used:   Print then Give to Patient   RxID:   [id number]    Orders Added: 1)  Est. Patient Level III [47425]    Current Allergies (reviewed today): ! * AVELOX ! SULFA ! BIAXIN ! * VICODIN TUSS ! * CONTRAST DYE

## 2010-07-16 NOTE — Assessment & Plan Note (Signed)
Summary: ST,L EAR,COUGH/CLE   Vital Signs:  Patient profile:   77 year old female Weight:      128.50 pounds O2 Sat:      94 % on Room air Temp:     98.6 degrees F oral Pulse rate:   84 / minute Pulse rhythm:   regular Resp:     24 per minute BP sitting:   110 / 70  (left arm) Cuff size:   regular  Vitals Entered By: Sydell Axon LPN (April 01, 2010 10:28 AM)  O2 Flow:  Room air CC: Sore throat, left ear pain, chest congestion and productive cough/white   History of Present Illness: Started feeling bad Sunday AM.  Inc in cough and change/inc in sputum.  "I feel terrible."  ST and feeling of irritation in throat.  L ear pain.  No fevers but chills this AM.  No diarrhea, + rhinorrhea this AM.  Wheezing more and coughing more at night (and during the day).  Some relief of cough with SABA.  Using SABA more than normal.    Tolerates zmax.  Prev best relief of cough with tussionex.  Allergies: 1)  ! * Avelox 2)  ! Sulfa 3)  ! Biaxin 4)  ! * Vicodin Tuss 5)  ! * Contrast Dye  Past History:  Past Medical History: Last updated: 06/19/2009 Colonic polyps, hx of COPD Diverticulosis, colon GERD Hypertension Osteoarthritis Hyperlipidemia aortic anursym in abdomen ovarian cyst L ovary AAA  Review of Systems       See HPI.  Otherwise negative.    Physical Exam  General:  GEN: nad, alert and oriented HEENT: mucous membranes moist, TM wnl, no OP exudates NECK: supple w/o LA CV: rrr.   PULM:  no inc wob but scattered exp wheeze and ronchi ABD: soft, +bs EXT: no edema SKIN: no acute rash    Impression & Recommendations:  Problem # 1:  COPD (ICD-496) Likley COPD exacerbation.  Okay for outpatient tx.  If more short of breath, then patient will proceed to ER.  Currently able to ambulate without getting short of breath.  d/w patient [id number] and use.  She has toleralted zmax prev.  She agrees with plan.  Continue COPD meds.  Her updated medication list for this  problem includes:    Advair Diskus 250-50 Mcg/dose Misc (Fluticasone-salmeterol) ..... Use one time a day    Albuterol Sulfate (2.5 Mg/11ml) 0.083% Nebu (Albuterol sulfate) ..... Use in nebulizer four times daily with atrovent.    Ipratropium-albuterol 0.5-2.5 (3) Mg/50ml Soln (Ipratropium-albuterol) ..... Use as directed.  Complete Medication List: 1)  Nexium 40 Mg Cpdr (Esomeprazole magnesium) .... Take one by mouth once a day 2)  Advair Diskus 250-50 Mcg/dose Misc (Fluticasone-salmeterol) .... Use one time a day 3)  Centrum Silver Tabs (Multiple vitamins-minerals) .... Once weekly 4)  Ultravate 0.05 % Oint (Halobetasol propionate) .... As needed 5)  Antivert 25 Mg Tabs (Meclizine hcl) .... One tab by mouth every 6 hrs as needed dizziness 6)  Garlique 400 Mg Tbec (Garlic) .[name].. 1 by mouth daily 7)  Lipitor 10 Mg Tabs (Atorvastatin calcium) .[name].. 1 daily for cholesterol 8)  Xanax 0.5 Mg Tabs (Alprazolam) .[name].. 1 at bedtime as needed 9)  Albuterol Sulfate (2.5 Mg/38ml) 0.083% Nebu (Albuterol sulfate) .... Use in nebulizer four times daily with atrovent. 10)  Percocet 5-325 Mg Tabs (Oxycodone-acetaminophen) .[name].. 1 tab every 6 hours as needed for pain 11)  Losartan Potassium 100 Mg Tabs (Losartan potassium) .Marland KitchenMarland KitchenMarland Kitchen 1  tab by mouth daily. 12)  Ipratropium-albuterol 0.5-2.5 (3) Mg/40ml Soln (Ipratropium-albuterol) .... Use as directed. 13)  Tramadol Hcl 50 Mg Tabs (Tramadol hcl) .[name].. 1 tab by mouth two times a day as needed pain 14)  Vicodin 5-500 Mg Tabs (Hydrocodone-acetaminophen) .[name].. 1 tab every 6 hours as needed for cough or pain 15)  Zithromax 250 Mg Tabs (Azithromycin) .... 2 by mouth today and then 1 by mouth once daily for 4 days. 16)  Tussionex Pennkinetic Er 10-8 Mg/63ml Lqcr (Hydrocod polst-chlorphen polst) .... 5ml by mouth two times a day for cough with sedation caution 17)  Prednisone 10 Mg Tabs (Prednisone) .... 5 by mouth x3 days, 4po x3 days, 3po x3days, 2po x2days, 1po x1days, take with  food.  Patient Instructions: 1)  Start the antibiotics today along with the prednisone.  Take the prednisone with food.  If you have more shortness of breath, go to the ER.  I would expect this to get better over the next few days.  Take care. Prescriptions: PREDNISONE 10 MG TABS (PREDNISONE) 5 by mouth x3 days, 4po x3 days, 3po x3days, 2po x2days, 1po x1days, take with food.  #41 x 0   Entered and Authorized by:   Crawford Givens MD   Signed by:   Crawford Givens MD on 04/01/2010   Method used:   Print then Give to Patient   RxID:   [id number] TUSSIONEX PENNKINETIC ER 10-8 MG/5ML LQCR (HYDROCOD POLST-CHLORPHEN POLST) 5ml by mouth two times a day for cough with sedation caution  #2 oz x 0   Entered and Authorized by:   Crawford Givens MD   Signed by:   Crawford Givens MD on 04/01/2010   Method used:   Print then Give to Patient   RxID:   [id number] ZITHROMAX 250 MG TABS (AZITHROMYCIN) 2 by mouth today and then 1 by mouth once daily for 4 days.  #6 x 0   Entered and Authorized by:   Crawford Givens MD   Signed by:   Crawford Givens MD on 04/01/2010   Method used:   Print then Give to Patient   RxID:   [id number]    Orders Added: 1)  Est. Patient Level III [60109]    Current Allergies (reviewed today): ! * AVELOX ! SULFA ! BIAXIN ! * VICODIN TUSS ! * CONTRAST DYE

## 2010-07-16 NOTE — Medication Information (Signed)
Summary: Request to change Xopenex to Albuterol/[facility]  Request to change Xopenex to Albuterol/[facility]   Imported By: Sherian Rein 07/20/2009 09:06:47  _____________________________________________________________________  External Attachment:    Type:   Image     Comment:   External Document

## 2010-07-16 NOTE — Assessment & Plan Note (Signed)
Summary: aron flu shot/rbh   Nurse Visit   Allergies: 1)  ! * Avelox 2)  ! Sulfa 3)  ! Biaxin 4)  ! * Vicodin Tuss 5)  ! * Contrast Dye  Immunizations Administered:  Influenza Vaccine # 1:    Vaccine Type: Fluvax 3+    Site: left deltoid    Mfr: GlaxoSmithKline    Dose: 0.5 ml    Route: IM    Given by: Mervin Hack CMA (AAMA)    Exp. Date: 12/14/2010    Lot #: [id number]    VIS given: 01/08/10 version given March 05, 2010.  Flu Vaccine Consent Questions:    Do you have a history of severe allergic reactions to this vaccine? no    Any prior history of allergic reactions to egg and/or gelatin? no    Do you have a sensitivity to the preservative Thimersol? no    Do you have a past history of Guillan-Barre Syndrome? no    Do you currently have an acute febrile illness? no    Have you ever had a severe reaction to latex? no    Vaccine information given and explained to patient? yes    Are you currently pregnant? no  Orders Added: 1)  Flu Vaccine 45yrs + [90658] 2)  Admin 1st Vaccine [78469]

## 2010-07-16 NOTE — Assessment & Plan Note (Signed)
Summary: COUGH,S.O.B./CLE   Vital Signs:  Patient profile:   77 year old female Height:      60 inches Weight:      128.50 pounds BMI:     25.19 O2 Sat:      94 % on Room air Temp:     97.9 degrees F oral Pulse rate:   88 / minute Pulse rhythm:   regular BP sitting:   140 / 92  (right arm) Cuff size:   regular  Vitals Entered By: Linde Gillis CMA Duncan Dull) (February 04, 2010 3:34 PM)  O2 Flow:  Room air CC: cough, shortness of breath   History of Present Illness: 77 yo with 3 days of coughing and wheezing. Cleaned her back deck on Saturday and since then, cannot stop wheezing. Using her Advair inhaler along with her albuterol nebs as needed. Over past few days, has needed her nebs several times per day. No cough. No fevers or chills. No runny nose, sneezing, SOB Or CP.     Current Medications (verified): 1)  Nexium 40 Mg Cpdr (Esomeprazole Magnesium) .... Take One By Mouth Once A Day 2)  Advair Diskus 250-50 Mcg/dose Misc (Fluticasone-Salmeterol) .... Use One Time A Day 3)  Centrum Silver  Tabs (Multiple Vitamins-Minerals) .... Once Weekly 4)  Ultravate 0.05 %  Oint (Halobetasol Propionate) .... As Needed 5)  Antivert 25 Mg Tabs (Meclizine Hcl) .... One Tab By Mouth Every 6 Hrs As Needed Dizziness 6)  Garlique 400 Mg Tbec (Garlic) .[name].. 1 By Mouth Daily 7)  Lipitor 10 Mg Tabs (Atorvastatin Calcium) .[name].. 1 Daily For Cholesterol 8)  Xanax 0.5 Mg Tabs (Alprazolam) .[name].. 1 At Bedtime As Needed 9)  Albuterol Sulfate (2.5 Mg/32ml) 0.083% Nebu (Albuterol Sulfate) .... Use in Nebulizer Four Times Daily With Atrovent. 10)  Percocet 5-325 Mg Tabs (Oxycodone-Acetaminophen) .[name].. 1 Tab Every 6 Hours As Needed For Pain 11)  Losartan Potassium 100 Mg Tabs (Losartan Potassium) .[name].. 1 Tab By Mouth Daily. 12)  Ipratropium-Albuterol 0.5-2.5 (3) Mg/51ml Soln (Ipratropium-Albuterol) .... Use As Directed. 13)  Tramadol Hcl 50 Mg  Tabs (Tramadol Hcl) .[name].. 1 Tab By Mouth Two Times A Day As Needed  Pain 14)  Vicodin 5-500 Mg Tabs (Hydrocodone-Acetaminophen) .[name].. 1 Tab Every 6 Hours As Needed For Cough or Pain 15)  Prednisone 10 Mg  Tabs (Prednisone) .... 5 Tabs By Mouth X 3 Days,  4 Tabs By Mouth X 3 Days, 3 Tabs By Mouth X 3 Days, 2 Tabs By Mouth X 2 Days, 1 Tab By Mouth X 1 Day. Dispense Qs  Allergies: 1)  ! * Avelox 2)  ! Sulfa 3)  ! Biaxin 4)  ! * Vicodin Tuss 5)  ! * Contrast Dye  Past History:  Past Medical History: Last updated: 06/19/2009 Colonic polyps, hx of COPD Diverticulosis, colon GERD Hypertension Osteoarthritis Hyperlipidemia aortic anursym in abdomen ovarian cyst L ovary AAA  Past Surgical History: Last updated: 06/19/2009 Hysterectomy--1975--ovaries intact Kidney surgery- stones Appendectomy Tonsillectomy DEXA + osteoporosis-- 04/01 EGD-- 9/98---03/29/08--stricture of esophagus and duodenitis wihtout hemorrhage Colonoscopy 9/98 -- 10/02 Left shoulder hemiarthroplasty-- 01/18/03 Carotid doppler 39% ICA --12/06 Right cataract-- 09/07--L cataract 6/09 CT Abd/pelvis- 2 renal stones, AAA-- 05/19/06 BA swallow--02/28/08--stricture AAA endovascular surgery 10/23/08  Family History: Last updated: Oct 20, 2008 Father: Deceased, [place] Mother: Deceased, [place] Siblings: One brother cancer- pancreas, 3 sisters CV + BP + Cancer + Gout/arthritis + DM - none Father DVT Family History Colon Cancer brother  Social History: Last updated: 03/28/2008 Marital Status:  Married Children:  Occupation: retired Patient is a former smoker.  Alcohol Use - no Daily Caffeine Use 1/2 cup coffee every am and Diet Sprite during the day  Risk Factors: Smoking Status: quit > 6 months (06/19/2009)  Review of Systems      See HPI General:  Denies malaise. CV:  Denies chest pain or discomfort. Resp:  Complains of shortness of breath and wheezing; denies sputum productive.  Physical Exam  General:  alert, well-developed, well-nourished, and well-hydrated. no increased  WOB. VS reviewed- normotensive O2 sat 94% Lungs:  normal respiratory effort, no intercostal retractions, and no accessory muscle use. diffuse exp wheezes. Heart:  normal rate, regular rhythm, and no murmur.   Extremities:  no edema either lower legs Psych:  normally interactive, moderately anxious   Impression & Recommendations:  Problem # 1:  COPD (ICD-496) Assessment Deteriorated  Discussed using a mask when cleaning outside. GIven Decadron IM in office today. Continue with prednisone burst. Continue Advair and albuterol, xopenex nebs. Her updated medication list for this problem includes:    Advair Diskus 250-50 Mcg/dose Misc (Fluticasone-salmeterol) ..... Use one time a day    Albuterol Sulfate (2.5 Mg/25ml) 0.083% Nebu (Albuterol sulfate) ..... Use in nebulizer four times daily with atrovent.    Ipratropium-albuterol 0.5-2.5 (3) Mg/44ml Soln (Ipratropium-albuterol) ..... Use as directed.  Complete Medication List: 1)  Nexium 40 Mg Cpdr (Esomeprazole magnesium) .... Take one by mouth once a day 2)  Advair Diskus 250-50 Mcg/dose Misc (Fluticasone-salmeterol) .... Use one time a day 3)  Centrum Silver Tabs (Multiple vitamins-minerals) .... Once weekly 4)  Ultravate 0.05 % Oint (Halobetasol propionate) .... As needed 5)  Antivert 25 Mg Tabs (Meclizine hcl) .... One tab by mouth every 6 hrs as needed dizziness 6)  Garlique 400 Mg Tbec (Garlic) .[name].. 1 by mouth daily 7)  Lipitor 10 Mg Tabs (Atorvastatin calcium) .[name].. 1 daily for cholesterol 8)  Xanax 0.5 Mg Tabs (Alprazolam) .[name].. 1 at bedtime as needed 9)  Albuterol Sulfate (2.5 Mg/70ml) 0.083% Nebu (Albuterol sulfate) .... Use in nebulizer four times daily with atrovent. 10)  Percocet 5-325 Mg Tabs (Oxycodone-acetaminophen) .[name].. 1 tab every 6 hours as needed for pain 11)  Losartan Potassium 100 Mg Tabs (Losartan potassium) .[name].. 1 tab by mouth daily. 12)  Ipratropium-albuterol 0.5-2.5 (3) Mg/35ml Soln (Ipratropium-albuterol) .... Use as  directed. 13)  Tramadol Hcl 50 Mg Tabs (Tramadol hcl) .[name].. 1 tab by mouth two times a day as needed pain 14)  Vicodin 5-500 Mg Tabs (Hydrocodone-acetaminophen) .[name].. 1 tab every 6 hours as needed for cough or pain 15)  Prednisone 10 Mg Tabs (Prednisone) .... 5 tabs by mouth x 3 days,  4 tabs by mouth x 3 days, 3 tabs by mouth x 3 days, 2 tabs by mouth x 2 days, 1 tab by mouth x 1 day. dispense qs Prescriptions: PREDNISONE 10 MG  TABS (PREDNISONE) 5 tabs by mouth x 3 days,  4 tabs by mouth x 3 days, 3 tabs by mouth x 3 days, 2 tabs by mouth x 2 days, 1 tab by mouth x 1 day. dispense qs  #1 x 0   Entered and Authorized by:   [name] MD   Signed by:   [name] MD on 02/04/2010   Method used:   Electronically to        [facility]  [address] [phone]* (retail)       [address]       [place], [place]  [address]       Ph: [phone] or [phone]       Fax: [phone]   RxID:   [id number] PREDNISONE 10 MG  TABS (PREDNISONE) 5 tabs by mouth x 3 days, then 4 tabs by mouth x 3 days, then 3 tabs by mouth x 3 days, then 2 tabs by mouth x 2 days, then 1 tab by mouth x 1 day. dispense qs  #1 x 0   Entered and Authorized by:   [name] MD   Signed by:   [name] MD on 02/04/2010   Method used:   Print then Give to Patient   RxID:   [id number] VICODIN 5-500 MG TABS (HYDROCODONE-ACETAMINOPHEN) 1 tab every 6 hours as needed for cough or pain  #60 x 0   Entered and Authorized by:   [name] MD   Signed by:   [name] MD on 02/04/2010   Method used:   Print then Give to Patient   RxID:   [id number]   Current Allergies (reviewed today): ! * AVELOX ! SULFA ! BIAXIN ! * VICODIN TUSS ! * CONTRAST DYE   Medication Administration  Injection # 1:    Medication: Dexamethasone Sodium Phosphate 1mg     Diagnosis: COPD (ICD-496)    Route: IM    Site: R deltoid    Exp Date: 06/16/2010    Lot #: [id number]    Mfr: Baxter    Comments: given in right deltoid per patient's  request    Patient tolerated injection without complications    Given by: Linde Gillis CMA Duncan Dull) (February 04, 2010 4:07 PM)

## 2010-07-16 NOTE — Letter (Signed)
Summary: Statement of Medical Necessity/ [facility]  Statement of Medical Necessity/ [facility]   Imported By: Lennie Odor 10/12/2009 14:32:14  _____________________________________________________________________  External Attachment:    Type:   Image     Comment:   External Document

## 2010-07-16 NOTE — Letter (Signed)
Summary: CMN for Nebulizer/[facility]  CMN for Nebulizer/[facility]   Imported By: Lanelle Bal 02/20/2010 12:04:57  _____________________________________________________________________  External Attachment:    Type:   Image     Comment:   External Document

## 2010-07-16 NOTE — Progress Notes (Signed)
Summary: regarding losartan  Phone Note Call from Patient Call back at Home Phone [phone]   Caller: Patient Call For: Dr. Dayton Martes Summary of Call: Pt was seen last week and medicine was changed to losartan.  Two scripts were sent to the pharmacy, one for 100 mg's and one for 50 mg's.  Pt is confused, I advised her that chart says she is supposed to take one 100 mg a day.  Please advise. Initial call taken by: Lowella Petties CMA,  August 07, 2009 10:41 AM  Follow-up for Phone Call        Sorry about that. Yes, should be 100 mg. Follow-up by: [name] MD,  August 07, 2009 10:54 AM  Additional Follow-up for Phone Call Additional follow up Details #1::        Advised pt. Additional Follow-up by: Lowella Petties CMA,  August 07, 2009 11:05 AM

## 2010-07-16 NOTE — Progress Notes (Signed)
Summary: not happy with [facility]  Phone Note Call from Patient Call back at Home Phone [phone] Message from:  Patient  Caller: Patient Call For: [name] MD Summary of Call: Pt is not happy with the service she is getting from [facility] regarding her nebulizer supplies.  I suggested she try another company and gave her the names and numbers of [facility] and [facility]. Initial call taken by: Lowella Petties CMA,  November 27, 2009 3:55 PM  Follow-up for Phone Call        agreed. [name] MD  November 28, 2009 7:34 AM

## 2010-07-16 NOTE — Assessment & Plan Note (Signed)
Summary: PROBLEMS WITH BP MEDS / LFW   Vital Signs:  Patient profile:   77 year old female Height:      60 inches Weight:      127.38 pounds BMI:     24.97 Temp:     98.0 degrees F oral Pulse rate:   68 / minute Pulse rhythm:   regular BP sitting:   110 / 80  (left arm) Cuff size:   regular  Vitals Entered By: Linde Gillis CMA Duncan Dull) (September 12, 2009 9:38 AM) CC: problems with blood pressure   History of Present Illness: 77 yo here for follow up blood pressure.  Went to see her heart doctor and BP was 163/101.  She did have to rush to that appointment and had just taken her blood pressure medication before going to appt. She did not have any CP, blurred vision, or focal neurological symptoms. Doctor told her to follow up with [place] immediately.  Had been on Cozaar for years but insurance company requested changing to Losartan in February.  Current Medications (verified): 1)  Nexium 40 Mg Cpdr (Esomeprazole Magnesium) .... Take One By Mouth Once A Day 2)  Advair Diskus 250-50 Mcg/dose Misc (Fluticasone-Salmeterol) .... Use One Time A Day 3)  Calcium 500 Mg Tabs (Calcium) .... Once Weekly 4)  Centrum Silver  Tabs (Multiple Vitamins-Minerals) .... Once Weekly 5)  Ultravate 0.05 %  Oint (Halobetasol Propionate) .... As Needed 6)  Antivert 25 Mg Tabs (Meclizine Hcl) .... One Tab By Mouth Every 6 Hrs As Needed Dizziness 7)  Garlique 400 Mg Tbec (Garlic) .[name].. 1 By Mouth Daily 8)  Lipitor 10 Mg Tabs (Atorvastatin Calcium) .[name].. 1 Daily For Cholesterol 9)  Xanax 0.5 Mg Tabs (Alprazolam) .[name].. 1 At Bedtime As Needed 10)  Vicodin 5-500 Mg Tabs (Hydrocodone-Acetaminophen) .[name].. 1 Every 4 Hrs As Needed Cough 11)  Albuterol Sulfate (2.5 Mg/32ml) 0.083% Nebu (Albuterol Sulfate) .... Use in Nebulizer Four Times Daily With Atrovent. 12)  Valtrex 1 Gm Tabs (Valacyclovir Hcl) .[name].. 1 Tab Three Times A Day For 7 Days 13)  Percocet 5-325 Mg Tabs (Oxycodone-Acetaminophen) .[name].. 1 Tab Every 6 Hours As Needed  For Pain 14)  Vicodin 5-500 Mg Tabs (Hydrocodone-Acetaminophen) .[name].. 1 Tab Every 4 Hours As Needed Cough. 15)  Prednisone 10 Mg Tabs (Prednisone) .... 5 Tabs By Mouth X 1 Day, Then 4 Tabs By Mouth X 3 Days, Then 3 Tabs By Mouth X 3 Days, 2 Tabs By Mouth 2 Days, Then 1 Tab For 1 Day. 16)  Losartan Potassium 100 Mg Tabs (Losartan Potassium) .[name].. 1 Tab By Mouth Daily. 17)  Ipratropium-Albuterol 0.5-2.5 (3) Mg/64ml Soln (Ipratropium-Albuterol) .... Use As Directed.  Allergies: 1)  ! * Avelox 2)  ! Sulfa 3)  ! Biaxin 4)  ! * Vicodin Tuss 5)  ! * Contrast Dye  Review of Systems      See HPI General:  Denies malaise. CV:  Denies chest pain or discomfort. Resp:  Denies shortness of breath.  Physical Exam  General:  alert, well-developed, well-nourished, and well-hydrated. no increased WOB. VS reviewed- normotensive Neck:  No deformities, masses, or tenderness noted. Lungs:  normal respiratory effort, no intercostal retractions, and no accessory muscle use. no wheezes,! Heart:  normal rate, regular rhythm, and no murmur.   Extremities:  no edema either lower legs Psych:  normally interactive, moderately anxious   Impression & Recommendations:  Problem # 1:  HYPERTENSION (ICD-401.9) Assessment Improved Continue Losartan at current dose. BP was likely  elevated last week due to stress and BP meds were likely not in system yet. She will continue to monitor BP at home. Her updated medication list for this problem includes:    Losartan Potassium 100 Mg Tabs (Losartan potassium) .[name]... 1 tab by mouth daily.  Complete Medication List: 1)  Nexium 40 Mg Cpdr (Esomeprazole magnesium) .... Take one by mouth once a day 2)  Advair Diskus 250-50 Mcg/dose Misc (Fluticasone-salmeterol) .... Use one time a day 3)  Calcium 500 Mg Tabs (Calcium) .... Once weekly 4)  Centrum Silver Tabs (Multiple vitamins-minerals) .... Once weekly 5)  Ultravate 0.05 % Oint (Halobetasol propionate) .... As needed 6)   Antivert 25 Mg Tabs (Meclizine hcl) .... One tab by mouth every 6 hrs as needed dizziness 7)  Garlique 400 Mg Tbec (Garlic) .[name].. 1 by mouth daily 8)  Lipitor 10 Mg Tabs (Atorvastatin calcium) .[name].. 1 daily for cholesterol 9)  Xanax 0.5 Mg Tabs (Alprazolam) .[name].. 1 at bedtime as needed 10)  Vicodin 5-500 Mg Tabs (Hydrocodone-acetaminophen) .[name].. 1 every 4 hrs as needed cough 11)  Albuterol Sulfate (2.5 Mg/59ml) 0.083% Nebu (Albuterol sulfate) .... Use in nebulizer four times daily with atrovent. 12)  Valtrex 1 Gm Tabs (Valacyclovir hcl) .[name].. 1 tab three times a day for 7 days 13)  Percocet 5-325 Mg Tabs (Oxycodone-acetaminophen) .[name].. 1 tab every 6 hours as needed for pain 14)  Vicodin 5-500 Mg Tabs (Hydrocodone-acetaminophen) .[name].. 1 tab every 4 hours as needed cough. 15)  Prednisone 10 Mg Tabs (Prednisone) .... 5 tabs by mouth x 1 day, then 4 tabs by mouth x 3 days, then 3 tabs by mouth x 3 days, 2 tabs by mouth 2 days, then 1 tab for 1 day. 16)  Losartan Potassium 100 Mg Tabs (Losartan potassium) .[name].. 1 tab by mouth daily. 17)  Ipratropium-albuterol 0.5-2.5 (3) Mg/15ml Soln (Ipratropium-albuterol) .... Use as directed. Prescriptions: LOSARTAN POTASSIUM 100 MG TABS (LOSARTAN POTASSIUM) 1 tab by mouth daily.  #30 x 6   Entered and Authorized by:   [name] MD   Signed by:   [name] MD on 09/12/2009   Method used:   [facility]        [facility] (retail)       [address]       [place], [place]  [address]       Ph: [phone] or [phone]       Fax: [phone]   RxID:   [id number] IPRATROPIUM-ALBUTEROL 0.5-2.5 (3) MG/3ML SOLN (IPRATROPIUM-ALBUTEROL) Use as directed.  #1 box x 6   Entered and Authorized by:   [name] MD   Signed by:   [name] MD on 09/12/2009   Method used:   Print then Give to Patient   RxID:   [id number]   Current Allergies (reviewed today): ! * AVELOX ! SULFA ! BIAXIN ! * VICODIN TUSS ! * CONTRAST DYE

## 2010-07-16 NOTE — Letter (Signed)
Summary: Statement of Medical Necessity/ [facility]  Statement of Medical Necessity/ [facility]   Imported By: Lennie Odor 10/02/2009 14:46:10  _____________________________________________________________________  External Attachment:    Type:   Image     Comment:   External Document

## 2010-07-16 NOTE — Assessment & Plan Note (Signed)
Summary: fatigue   Vital Signs:  Patient profile:   77 year old female Height:      60 inches Weight:      131.13 pounds BMI:     25.70 Temp:     97.9 degrees F oral Pulse rate:   84 / minute Pulse rhythm:   regular BP sitting:   140 / 100  (right arm) Cuff size:   regular  Vitals Entered By: Linde Gillis CMA Duncan Dull) (Oct 31, 2009 9:40 AM) CC: fatigue   History of Present Illness: 77 yo here for worsening fatigue.  Last several months, feels tired all the time.  Has gained 10 pounds in her abdomen since February. Does have an ovarian cyst that is being followed, per pt CA125 was normal last week.  Denies any CP, SOB ,or LE. Has issues with sinuses but not severe.  Her arthritis is getting much worse.  Has severe OA in hands, knees, feet but hands are getting unbearable.  Percocet helps but makes her even more fatigued.  Wants to try something else. No time of day is worse, pain is now constant.  Feels like her right hand is getting weaker  No fevers, chills.  Denies any signs of depression.  She is worried something is "just not right."  Labs reviewed from 07/2009- normal BMET, liver function, cholesterol.  Allergies: 1)  ! * Avelox 2)  ! Sulfa 3)  ! Biaxin 4)  ! * Vicodin Tuss 5)  ! * Contrast Dye  Review of Systems      See HPI General:  Complains of fatigue and malaise; denies fever and loss of appetite. Eyes:  Denies blurring. ENT:  Denies difficulty swallowing. CV:  Denies chest pain or discomfort, difficulty breathing at night, and difficulty breathing while lying down. Resp:  Denies shortness of breath. GI:  Denies abdominal pain, bloody stools, and change in bowel habits. GU:  Denies incontinence. MS:  Complains of joint pain, joint swelling, and loss of strength; denies joint redness. Derm:  Denies rash. Neuro:  Denies headaches. Psych:  Denies anxiety and depression. Endo:  Denies cold intolerance and heat intolerance.  Physical Exam  General:   alert, well-developed, well-nourished, and well-hydrated. no increased WOB. VS reviewed- normotensive Ears:  R ear normal and L ear normal.   Nose:  External nasal examination shows no deformity or inflammation. Nasal mucosa are pink and moist without lesions or exudates. Mouth:  Oral mucosa and oropharynx without lesions or exudates.  Teeth in good repair. Lungs:  normal respiratory effort, no intercostal retractions, and no accessory muscle use. no wheezes,! Heart:  normal rate, regular rhythm, and no murmur.   Abdomen:  soft, non-tender, and normal bowel sounds.     Msk:  no joint warmth, no redness over joints, no crepitation, enlarged PIP joints, enlarged DIP joints, and ulnar deviation of fingers.   Extremities:  no edema either lower legs Psych:  normally interactive, moderately anxious   Impression & Recommendations:  Problem # 1:  FATIGUE (ICD-780.79) Assessment New Differential is wide.  Will draw some labs today to start work up.  Pt denying symptoms of depression. Orders: Venipuncture ([address]) TLB-BMP (Basic Metabolic Panel-BMET) (80048-METABOL) TLB-CBC Platelet - w/Differential (85025-CBCD) TLB-Hepatic/Liver Function Pnl (80076-HEPATIC) TLB-TSH (Thyroid Stimulating Hormone) (84443-TSH) TLB-Rheumatoid Factor (RA) (60454-UJ) TLB-Sedimentation Rate (ESR) (85652-ESR)  Problem # 2:  OSTEOARTHRITIS (ICD-715.90) Assessment: Deteriorated Tramadol as needed for pain. Percocet when severe. The following medications were removed from the medication list:    Vicodin  5-500 Mg Tabs (Hydrocodone-acetaminophen) .[name]... 1 every 4 hrs as needed cough    Vicodin 5-500 Mg Tabs (Hydrocodone-acetaminophen) .[name]... 1 tab every 4 hours as needed cough. Her updated medication list for this problem includes:    Percocet 5-325 Mg Tabs (Oxycodone-acetaminophen) .[name]... 1 tab every 6 hours as needed for pain    Tramadol Hcl 50 Mg Tabs (Tramadol hcl) .[name]... 1 tab by mouth two times a day as needed  pain  Complete Medication List: 1)  Nexium 40 Mg Cpdr (Esomeprazole magnesium) .... Take one by mouth once a day 2)  Advair Diskus 250-50 Mcg/dose Misc (Fluticasone-salmeterol) .... Use one time a day 3)  Calcium 500 Mg Tabs (Calcium) .... Once weekly 4)  Centrum Silver Tabs (Multiple vitamins-minerals) .... Once weekly 5)  Ultravate 0.05 % Oint (Halobetasol propionate) .... As needed 6)  Antivert 25 Mg Tabs (Meclizine hcl) .... One tab by mouth every 6 hrs as needed dizziness 7)  Garlique 400 Mg Tbec (Garlic) .[name].. 1 by mouth daily 8)  Lipitor 10 Mg Tabs (Atorvastatin calcium) .[name].. 1 daily for cholesterol 9)  Xanax 0.5 Mg Tabs (Alprazolam) .[name].. 1 at bedtime as needed 10)  Albuterol Sulfate (2.5 Mg/69ml) 0.083% Nebu (Albuterol sulfate) .... Use in nebulizer four times daily with atrovent. 11)  Percocet 5-325 Mg Tabs (Oxycodone-acetaminophen) .[name].. 1 tab every 6 hours as needed for pain 12)  Losartan Potassium 100 Mg Tabs (Losartan potassium) .[name].. 1 tab by mouth daily. 13)  Ipratropium-albuterol 0.5-2.5 (3) Mg/22ml Soln (Ipratropium-albuterol) .... Use as directed. 14)  Tramadol Hcl 50 Mg Tabs (Tramadol hcl) .[name].. 1 tab by mouth two times a day as needed pain Prescriptions: TRAMADOL HCL 50 MG  TABS (TRAMADOL HCL) 1 tab by mouth two times a day as needed pain  #30 x 0   Entered and Authorized by:   [name] MD   Signed by:   [name] MD on 10/31/2009   Method used:   [facility]        [facility] (retail)       [address]       [place], [place]  [address]       Ph: [phone] or [phone]       Fax: [phone]   RxID:   [id number] PERCOCET 5-325 MG TABS (OXYCODONE-ACETAMINOPHEN) 1 tab every 6 hours as needed for pain  #30 x 0   Entered and Authorized by:   [name] MD   Signed by:   [name] MD on 10/31/2009   Method used:   Print then Give to Patient   RxID:   [id number]   Current Allergies (reviewed today): ! * AVELOX ! SULFA !  BIAXIN ! * VICODIN TUSS ! * CONTRAST DYE

## 2010-07-16 NOTE — Progress Notes (Signed)
Summary: xanax   Phone Note Refill Request Message from:  Fax from Pharmacy on February 15, 2010 10:16 AM  Refills Requested: Medication #1:  XANAX 0.5 MG TABS 1 at bedtime as needed [BMN]   Last Refilled: 08/29/1932 [phone]  Initial call taken by: Melody Comas,  February 15, 2010 10:17 AM  Follow-up for Phone Call        please call in. Follow-up by: Eustaquio Boyden  MD,  February 15, 2010 11:58 AM  Additional Follow-up for Phone Call Additional follow up Details #1::        Called into pharmacy as directed Additional Follow-up by: [name] CMA Duncan Dull),  February 15, 2010 12:08 PM    Prescriptions: XANAX 0.5 MG TABS (ALPRAZOLAM) 1 at bedtime as needed Brand medically necessary #30 x 0   Entered and Authorized by:   Eustaquio Boyden  MD   Signed by:   [name] CMA (AAMA) on 02/15/2010   Method used:   Telephoned to ...       [facility] (retail)       [address]       [place], [place]  [address]       Ph: [phone] or [phone]       Fax: [phone]   RxID:   [id number]

## 2010-07-16 NOTE — Progress Notes (Signed)
Summary: xanax  Phone Note Refill Request Message from:  Fax from Pharmacy on March 19, 2010 9:22 AM  Refills Requested: Medication #1:  XANAX 0.5 MG TABS 1 at bedtime as needed [BMN]   Last Refilled: 02/15/2010 [facility]   Initial call taken by: Melody Comas,  March 19, 2010 9:37 AM  Follow-up for Phone Call        Rx called to pharmacy Follow-up by: Linde Gillis CMA [facility] Dull),  March 19, 2010 10:14 AM    Prescriptions: XANAX 0.5 MG TABS (ALPRAZOLAM) 1 at bedtime as needed Brand medically necessary #30 x 0   Entered and Authorized by:   [name] MD   Signed by:   [name] MD on 03/19/2010   Method used:   Telephoned to ...       [facility] ([facility]ail)       [address]       [place], [place]  [address]       Ph: [phone] or [phone]       Fax: [phone]   RxID:   [id number]

## 2010-07-16 NOTE — Assessment & Plan Note (Signed)
Summary: CHEST CONGESTION/CLE   Vital Signs:  Patient profile:   77 year old female Height:      60 inches Weight:      126 pounds BMI:     24.70 Temp:     98 degrees F oral Pulse rate:   88 / minute Pulse rhythm:   regular BP sitting:   122 / 80  (left arm) Cuff size:   regular  Vitals Entered By: Delilah Shan CMA [facility] Dull) (July 20, 2009 8:46 AM) CC: Chest congestion   History of Present Illness: 77 yo with h/o COPD here for f/u for 5 days of sinusitis. Saw her on 1/26, had sinus pressure and congestion. Started Zpack given her h/o COPD. At that time did not have cough,wheezing or shortness of breath (did not need to use her rescue nebs).   Finished Zpack yesterday. Since she finished it, now has productive cough and wheezing.  Nasal congestion and sinus pressure resolved. Using her albuterol nebs every 4 hours, [place] wheezing. No shortness of breath.  Has had body aches, no fevers.   Current Medications (verified): 1)  Nexium 40 Mg Cpdr (Esomeprazole Magnesium) .... Take One By Mouth Once A Day 2)  Advair Diskus 250-50 Mcg/dose Misc (Fluticasone-Salmeterol) .... Use One Time A Day 3)  Calcium 500 Mg Tabs (Calcium) .... Once Weekly 4)  Centrum Silver  Tabs (Multiple Vitamins-Minerals) .... Once Weekly 5)  Ultravate 0.05 %  Oint (Halobetasol Propionate) .... As Needed 6)  Cozaar 100 Mg  Tabs (Losartan Potassium) .... One Daily 7)  Antivert 25 Mg Tabs (Meclizine Hcl) .... One Tab By Mouth Every 6 Hrs As Needed Dizziness 8)  Atrovent 0.03 % Soln (Ipratropium Bromide) .... Use One in Nebulizer Four Times Daily 9)  Garlique 400 Mg Tbec (Garlic) .[name].. 1 By Mouth Daily 10)  Lipitor 10 Mg Tabs (Atorvastatin Calcium) .[name].. 1 Daily For Cholesterol 11)  Xanax 0.5 Mg Tabs (Alprazolam) .[name].. 1 At Bedtime As Needed 12)  Vicodin 5-500 Mg Tabs (Hydrocodone-Acetaminophen) .[name].. 1 Every 4 Hrs As Needed Cough 13)  Albuterol Sulfate (2.5 Mg/9ml) 0.083% Nebu (Albuterol Sulfate) .... Use in  Nebulizer Four Times Daily With Atrovent. 14)  Valtrex 1 Gm Tabs (Valacyclovir Hcl) .[name].. 1 Tab Three Times A Day For 7 Days 15)  Percocet 5-325 Mg Tabs (Oxycodone-Acetaminophen) .[name].. 1 Tab Every 6 Hours As Needed For Pain 16)  Vicodin 5-500 Mg Tabs (Hydrocodone-Acetaminophen) .[name].. 1 Tab Every 4 Hours As Needed Cough. 17)  Prednisone 10 Mg Tabs (Prednisone) .... 5 Tabs By Mouth X 1 Day, Then 4 Tabs By Mouth X 3 Days, Then 3 Tabs By Mouth X 3 Days, 2 Tabs By Mouth 2 Days, Then 1 Tab For 1 Day.  Allergies: 1)  ! Percocet 2)  ! * Avelox 3)  ! Sulfa 4)  ! Biaxin 5)  ! * Vicodin Tuss 6)  ! * Contrast Dye  Review of Systems      See HPI General:  Complains of chills, fever, and malaise. ENT:  Denies earache. CV:  Denies chest pain or discomfort. Resp:  Complains of cough, sputum productive, and wheezing; denies shortness of breath.  Physical Exam  General:  alert, well-developed, well-nourished, and well-hydrated. no increased WOB. VS reviewed- afebrile and normotensive. Ears:  R ear normal and L ear normal.   Nose:  mucosal erythema Mouth:  MMM Lungs:  normal respiratory effort, no intercostal retractions, and no accessory muscle use. Diffuse expiratory wheezes througout, No rhonhi, no crackles.  Heart:  normal rate, regular rhythm, and no murmur.   Extremities:  no edema either lower legs Psych:  normally interactive, moderately anxious   Impression & Recommendations:  Problem # 1:  ACUTE BRONCHITIS (ICD-466.0) Assessment New s/p Zpack for bacterial sinusitis. Given that she is currently afebrile with no inc work of breathing but has diffuse exp wheezes, will treat with steroid burst and cont with recue nebs/advair. No further abx at this time (Zpack should [place] be in her system and not spiking temp).  If resp status worsened at all,advised to go to ER over the weekend. F/u with me on Monday, if symptoms persist, will get CXR. Her updated medication list for this problem  includes:    Advair Diskus 250-50 Mcg/dose Misc (Fluticasone-salmeterol) ..... Use one time a day    Albuterol Sulfate (2.5 Mg/90ml) 0.083% Nebu (Albuterol sulfate) ..... Use in nebulizer four times daily with atrovent.  Complete Medication List: 1)  Nexium 40 Mg Cpdr (Esomeprazole magnesium) .... Take one by mouth once a day 2)  Advair Diskus 250-50 Mcg/dose Misc (Fluticasone-salmeterol) .... Use one time a day 3)  Calcium 500 Mg Tabs (Calcium) .... Once weekly 4)  Centrum Silver Tabs (Multiple vitamins-minerals) .... Once weekly 5)  Ultravate 0.05 % Oint (Halobetasol propionate) .... As needed 6)  Cozaar 100 Mg Tabs (Losartan potassium) .... One daily 7)  Antivert 25 Mg Tabs (Meclizine hcl) .... One tab by mouth every 6 hrs as needed dizziness 8)  Atrovent 0.03 % Soln (Ipratropium bromide) .... Use one in nebulizer four times daily 9)  Garlique 400 Mg Tbec (Garlic) .[name].. 1 by mouth daily 10)  Lipitor 10 Mg Tabs (Atorvastatin calcium) .[name].. 1 daily for cholesterol 11)  Xanax 0.5 Mg Tabs (Alprazolam) .[name].. 1 at bedtime as needed 12)  Vicodin 5-500 Mg Tabs (Hydrocodone-acetaminophen) .[name].. 1 every 4 hrs as needed cough 13)  Albuterol Sulfate (2.5 Mg/60ml) 0.083% Nebu (Albuterol sulfate) .... Use in nebulizer four times daily with atrovent. 14)  Valtrex 1 Gm Tabs (Valacyclovir hcl) .[name].. 1 tab three times a day for 7 days 15)  Percocet 5-325 Mg Tabs (Oxycodone-acetaminophen) .[name].. 1 tab every 6 hours as needed for pain 16)  Vicodin 5-500 Mg Tabs (Hydrocodone-acetaminophen) .[name].. 1 tab every 4 hours as needed cough. 17)  Prednisone 10 Mg Tabs (Prednisone) .... 5 tabs by mouth x 1 day, then 4 tabs by mouth x 3 days, then 3 tabs by mouth x 3 days, 2 tabs by mouth 2 days, then 1 tab for 1 day.  Patient Instructions: 1)  Please go to ER if symptoms worsen over the weekend. 2)  Take prednisone as prescribed. 3)  Continue using Advair and your nebulizer treatments. Prescriptions: PREDNISONE 10 MG TABS  (PREDNISONE) 5 tabs by mouth x 1 day, then 4 tabs by mouth x 3 days, then 3 tabs by mouth x 3 days, 2 tabs by mouth 2 days, then 1 tab for 1 day.  #1 x 0   Entered and Authorized by:   [name] MD   Signed by:   [name] MD on 07/20/2009   Method used:   [facility]        [facility] ([facility]ail)       [address]       [place], [place]  [address]       Ph: [phone] or [phone]       Fax: [phone]   RxID:   [id number]   Current Allergies (  reviewed today): ! PERCOCET ! * AVELOX ! SULFA ! BIAXIN ! * VICODIN TUSS ! * CONTRAST DYE

## 2010-07-16 NOTE — Assessment & Plan Note (Signed)
Summary: ?SHINGLES/CLE   Vital Signs:  Patient profile:   77 year old female Height:      60 inches Weight:      128 pounds BMI:     25.09 Temp:     98.1 degrees F oral Pulse rate:   84 / minute Pulse rhythm:   regular BP sitting:   108 / 72  (left arm) Cuff size:   regular  Vitals Entered By: Delilah Shan CMA Duncan Dull) (July 02, 2009 11:31 AM) CC: ? shingles   History of Present Illness: 77 yo here with painful rash on right  lower abdomen.  Went to ER Friday night because she was developing right lower abdominal pain and back pain. No dysuria, no nausea, no vomiting, no diarrhea. No fevers. No hematuria.  ER notes reviewed: UA pos for UTI, sent home with prescription for ceferoxime, [place] taking. CT abd/pelvis- nephrolithaisis without uterolithiasis, stable. Lipase, CBC, CMET all within normal limits.  Saturday morning, developed painful rash over the side on her right lower quadrant that was hurting before.  Rash burns, very painful.  Current Medications (verified): 1)  Nexium 40 Mg Cpdr (Esomeprazole Magnesium) .... Take One By Mouth Once A Day 2)  Advair Diskus 250-50 Mcg/dose Misc (Fluticasone-Salmeterol) .... Use One Time A Day 3)  Calcium 500 Mg Tabs (Calcium) .... Once Weekly 4)  Centrum Silver  Tabs (Multiple Vitamins-Minerals) .... Once Weekly 5)  Ultravate 0.05 %  Oint (Halobetasol Propionate) .... As Needed 6)  Cozaar 100 Mg  Tabs (Losartan Potassium) .... One Daily 7)  Antivert 25 Mg Tabs (Meclizine Hcl) .... One Tab By Mouth Every 6 Hrs As Needed Dizziness 8)  Atrovent 0.03 % Soln (Ipratropium Bromide) .... Use One in Nebulizer Four Times Daily 9)  Garlique 400 Mg Tbec (Garlic) .[name].. 1 By Mouth Daily 10)  Lipitor 10 Mg Tabs (Atorvastatin Calcium) .[name].. 1 Daily For Cholesterol 11)  Xanax 0.5 Mg Tabs (Alprazolam) .[name].. 1 At Bedtime As Needed 12)  Vicodin 5-500 Mg Tabs (Hydrocodone-Acetaminophen) .[name].. 1 Every 4 Hrs As Needed Cough 13)  Xopenex 0.63 Mg/17ml Nebu  (Levalbuterol Hcl) .... Use in Nebulzer Three Times Daily 14)  Valtrex 1 Gm Tabs (Valacyclovir Hcl) .[name].. 1 Tab Three Times A Day For 7 Days 15)  Percocet 5-325 Mg Tabs (Oxycodone-Acetaminophen) .[name].. 1 Tab Every 6 Hours As Needed For Pain  Allergies: 1)  ! Percocet 2)  ! * Avelox 3)  ! Sulfa 4)  ! Biaxin 5)  ! * Vicodin Tuss 6)  ! * Contrast Dye  Review of Systems      See HPI General:  Denies chills and fever. GI:  Complains of abdominal pain; denies constipation, diarrhea, nausea, and vomiting. GU:  Denies dysuria, hematuria, and incontinence.  Physical Exam  General:  alert, well-developed, well-nourished, and well-hydrated.  mild resp distress--pursed lip breathing, anxious Mouth:  MMM Abdomen:  soft, non-tender, and normal bowel sounds.    erythematous papules with scattered grouped vesicles, approx 2 inches in diameter, RLQ Skin:  erythematous papules with scattered grouped vesicles, 2 inches in diameter, tender to touch Psych:  normally interactive, moderately anxious   Impression & Recommendations:  Problem # 1:  SHINGLES (ICD-053.9) Assessment New Time spent with patient 25 minutes reviewing ER charts and discussing prior treatments and symptoms. Will treat with Vatrex and narcotics for pain.  Complete Medication List: 1)  Nexium 40 Mg Cpdr (Esomeprazole magnesium) .... Take one by mouth once a day 2)  Advair Diskus 250-50 Mcg/dose  Misc (Fluticasone-salmeterol) .... Use one time a day 3)  Calcium 500 Mg Tabs (Calcium) .... Once weekly 4)  Centrum Silver Tabs (Multiple vitamins-minerals) .... Once weekly 5)  Ultravate 0.05 % Oint (Halobetasol propionate) .... As needed 6)  Cozaar 100 Mg Tabs (Losartan potassium) .... One daily 7)  Antivert 25 Mg Tabs (Meclizine hcl) .... One tab by mouth every 6 hrs as needed dizziness 8)  Atrovent 0.03 % Soln (Ipratropium bromide) .... Use one in nebulizer four times daily 9)  Garlique 400 Mg Tbec (Garlic) .[name].. 1 by mouth  daily 10)  Lipitor 10 Mg Tabs (Atorvastatin calcium) .[name].. 1 daily for cholesterol 11)  Xanax 0.5 Mg Tabs (Alprazolam) .[name].. 1 at bedtime as needed 12)  Vicodin 5-500 Mg Tabs (Hydrocodone-acetaminophen) .[name].. 1 every 4 hrs as needed cough 13)  Xopenex 0.63 Mg/6ml Nebu (Levalbuterol hcl) .... Use in nebulzer three times daily 14)  Valtrex 1 Gm Tabs (Valacyclovir hcl) .[name].. 1 tab three times a day for 7 days 15)  Percocet 5-325 Mg Tabs (Oxycodone-acetaminophen) .[name].. 1 tab every 6 hours as needed for pain Prescriptions: PERCOCET 5-325 MG TABS (OXYCODONE-ACETAMINOPHEN) 1 tab every 6 hours as needed for pain  #10 x 0   Entered and Authorized by:   [name] MD   Signed by:   [name] MD on 07/02/2009   Method used:   Print then Give to Patient   RxID:   [id number] VALTREX 1 GM TABS (VALACYCLOVIR HCL) 1 tab three times a day for 7 days  #21 x 0   Entered and Authorized by:   [name] MD   Signed by:   [name] MD on 07/02/2009   Method used:   Print then Give to Patient   RxID:   [id number]   Current Allergies (reviewed today): ! PERCOCET ! * AVELOX ! SULFA ! BIAXIN ! * VICODIN TUSS ! * CONTRAST DYE

## 2010-07-16 NOTE — Assessment & Plan Note (Signed)
Summary: FLU/RBH   Vital Signs:  Patient profile:   77 year old female Height:      60 inches Weight:      125.25 pounds BMI:     24.55 O2 Sat:      96 % on Room air Temp:     97.9 degrees F oral Pulse rate:   80 / minute Pulse rhythm:   regular Resp:     24 per minute BP sitting:   110 / 70  (left arm) Cuff size:   regular  Vitals Entered By: Delilah Shan CMA Duncan Dull) (July 11, 2009 11:38 AM)  O2 Flow:  Room air CC: ? flu / pneumonia   History of Present Illness: 77 yo with h/o COPD here for 5 days of sinus pressure, congestion, body aches. productive cough, no wheezing or shortness of breath. Has had chills and body aches, unsure if she has had a fever. Has h/o multiple COPD exacerbations, this does not feel like that yet.   Has not had to use her rescue nebulizer treatments.  Shingles rash and pain much better.  Current Medications (verified): 1)  Nexium 40 Mg Cpdr (Esomeprazole Magnesium) .... Take One By Mouth Once A Day 2)  Advair Diskus 250-50 Mcg/dose Misc (Fluticasone-Salmeterol) .... Use One Time A Day 3)  Calcium 500 Mg Tabs (Calcium) .... Once Weekly 4)  Centrum Silver  Tabs (Multiple Vitamins-Minerals) .... Once Weekly 5)  Ultravate 0.05 %  Oint (Halobetasol Propionate) .... As Needed 6)  Cozaar 100 Mg  Tabs (Losartan Potassium) .... One Daily 7)  Antivert 25 Mg Tabs (Meclizine Hcl) .... One Tab By Mouth Every 6 Hrs As Needed Dizziness 8)  Atrovent 0.03 % Soln (Ipratropium Bromide) .... Use One in Nebulizer Four Times Daily 9)  Garlique 400 Mg Tbec (Garlic) .[name].. 1 By Mouth Daily 10)  Lipitor 10 Mg Tabs (Atorvastatin Calcium) .[name].. 1 Daily For Cholesterol 11)  Xanax 0.5 Mg Tabs (Alprazolam) .[name].. 1 At Bedtime As Needed 12)  Vicodin 5-500 Mg Tabs (Hydrocodone-Acetaminophen) .[name].. 1 Every 4 Hrs As Needed Cough 13)  Xopenex 0.63 Mg/48ml Nebu (Levalbuterol Hcl) .... Use in Nebulzer Three Times Daily 14)  Valtrex 1 Gm Tabs (Valacyclovir Hcl) .[name].. 1 Tab Three Times A  Day For 7 Days 15)  Percocet 5-325 Mg Tabs (Oxycodone-Acetaminophen) .[name].. 1 Tab Every 6 Hours As Needed For Pain 16)  Azithromycin 250 Mg  Tabs (Azithromycin) .... 2 By  Mouth Today and Then 1 Daily For 4 Days 17)  Vicodin 5-500 Mg Tabs (Hydrocodone-Acetaminophen) .[name].. 1 Tab Every 4 Hours As Needed Cough.  Allergies: 1)  ! Percocet 2)  ! * Avelox 3)  ! Sulfa 4)  ! Biaxin 5)  ! * Vicodin Tuss 6)  ! * Contrast Dye  Review of Systems      See HPI General:  Complains of chills, malaise, and sweats; denies fever. ENT:  Complains of nasal congestion, postnasal drainage, and sinus pressure; denies earache and sore throat. CV:  Denies chest pain or discomfort. Resp:  Complains of cough and sputum productive; denies shortness of breath and wheezing. GI:  Denies abdominal pain, diarrhea, nausea, and vomiting.  Physical Exam  General:  alert, well-developed, well-nourished, and well-hydrated. no increased WOB. Nose:  nasal dischargemucosal pallor.   - sinuses Mouth:  MMM Lungs:  normal respiratory effort, no intercostal retractions, and no accessory muscle use, very occassional exp wheeze.   Heart:  normal rate, regular rhythm, and no murmur.   Psych:  normally interactive, moderately anxious   Impression & Recommendations:  Problem # 1:  ACUTE FRONTAL SINUSITIS (ICD-461.1) Assessment New Given her past medical history, will treat with abx. Zpack, continue breathing treatments.   Give red flag symptoms for immediate follow up. Her updated medication list for this problem includes:    Atrovent 0.03 % Soln (Ipratropium bromide) ..... Use one in nebulizer four times daily    Azithromycin 250 Mg Tabs (Azithromycin) .[name]... 2 by  mouth today and then 1 daily for 4 days  Complete Medication List: 1)  Nexium 40 Mg Cpdr (Esomeprazole magnesium) .... Take one by mouth once a day 2)  Advair Diskus 250-50 Mcg/dose Misc (Fluticasone-salmeterol) .... Use one time a day 3)  Calcium 500 Mg Tabs  (Calcium) .... Once weekly 4)  Centrum Silver Tabs (Multiple vitamins-minerals) .... Once weekly 5)  Ultravate 0.05 % Oint (Halobetasol propionate) .... As needed 6)  Cozaar 100 Mg Tabs (Losartan potassium) .... One daily 7)  Antivert 25 Mg Tabs (Meclizine hcl) .... One tab by mouth every 6 hrs as needed dizziness 8)  Atrovent 0.03 % Soln (Ipratropium bromide) .... Use one in nebulizer four times daily 9)  Garlique 400 Mg Tbec (Garlic) .[name].. 1 by mouth daily 10)  Lipitor 10 Mg Tabs (Atorvastatin calcium) .[name].. 1 daily for cholesterol 11)  Xanax 0.5 Mg Tabs (Alprazolam) .[name].. 1 at bedtime as needed 12)  Vicodin 5-500 Mg Tabs (Hydrocodone-acetaminophen) .[name].. 1 every 4 hrs as needed cough 13)  Xopenex 0.63 Mg/44ml Nebu (Levalbuterol hcl) .... Use in nebulzer three times daily 14)  Valtrex 1 Gm Tabs (Valacyclovir hcl) .[name].. 1 tab three times a day for 7 days 15)  Percocet 5-325 Mg Tabs (Oxycodone-acetaminophen) .[name].. 1 tab every 6 hours as needed for pain 16)  Azithromycin 250 Mg Tabs (Azithromycin) .... 2 by  mouth today and then 1 daily for 4 days 17)  Vicodin 5-500 Mg Tabs (Hydrocodone-acetaminophen) .[name].. 1 tab every 4 hours as needed cough. Prescriptions: VICODIN 5-500 MG TABS (HYDROCODONE-ACETAMINOPHEN) 1 tab every 4 hours as needed cough.  #30 x 0   Entered and Authorized by:   [name] MD   Signed by:   [name] MD on 07/11/2009   Method used:   Print then Give to Patient   RxID:   [id number] AZITHROMYCIN 250 MG  TABS (AZITHROMYCIN) 2 by  mouth today and then 1 daily for 4 days  #6 x 0   Entered and Authorized by:   [name] MD   Signed by:   [name] MD on 07/11/2009   Method used:   [facility] to        [facility] (retail)       [address]       [place], [place]  [address]       Ph: [phone] or [phone]       Fax: [phone]   RxID:   [id number]   Current Allergies (reviewed today): ! PERCOCET ! * AVELOX ! SULFA ! BIAXIN ! *  VICODIN TUSS ! * CONTRAST DYE

## 2010-07-16 NOTE — Letter (Signed)
Summary: CMN for Nebulizer/[facility]  CMN for Nebulizer/[facility]   Imported By: Lanelle Bal 09/18/2009 12:00:01  _____________________________________________________________________  External Attachment:    Type:   Image     Comment:   External Document

## 2010-07-16 NOTE — Letter (Signed)
Summary: [facility]   Imported By: Lanelle Bal 01/07/2010 11:34:20  _____________________________________________________________________  External Attachment:    Type:   Image     Comment:   External Document  Appended Document: [facility] ovarian cyst, larger in size.  CA125 neg.  Referred to onc for further work up.

## 2010-07-16 NOTE — [facility] Notes (Signed)
Summary: refill request for xanax  Phone Note Refill Request Message from:  Fax from Pharmacy  Refills Requested: Medication #1:  XANAX 0.5 MG TABS 1 at bedtime as needed [BMN]   Last Refilled: 10/03/2009 Faxed request from [employer], [phone].  Initial call taken by: Lowella Petties CMA,  Oct 29, 2009 2:15 PM  Follow-up for Phone Call        Rx called to [facility] on [address]. Follow-up by: Linde Gillis CMA Duncan Dull),  Oct 29, 2009 3:43 PM    Prescriptions: XANAX 0.5 MG TABS (ALPRAZOLAM) 1 at bedtime as needed Brand medically necessary #30 x 1   Entered and Authorized by:   [name] MD   Signed by:   [name] MD on 10/29/2009   Method used:   Telephoned to ...       [facility] (retail)       [address]       [place], [place]  [address]       Ph: [phone] or [phone]       Fax: [phone]   RxID:   [id number]

## 2010-07-16 NOTE — Assessment & Plan Note (Signed)
Summary: COUGH,COLD/CLE   Vital Signs:  Patient profile:   77 year old female Weight:      127.38 pounds O2 Sat:      95 % on Room air Temp:     98.4 degrees F oral Pulse rate:   86 / minute Pulse rhythm:   regular BP sitting:   160 / 82  (left arm) Cuff size:   regular  Vitals Entered By: [name] CMA (December 31, 2009 3:45 PM)  O2 Flow:  Room air CC: Cough/Wheezing   History of Present Illness: 77 yo with 3 days of coughing and wheezing. Using her Advair inhaler along with her albuterol nebs as needed. Over past few days, has needed her nebs several times per day. productive cough. No fevers or chills. No runny nose, sneezing, SOB Or CP.     Current Medications (verified): 1)  Nexium 40 Mg Cpdr (Esomeprazole Magnesium) .... Take One By Mouth Once A Day 2)  Advair Diskus 250-50 Mcg/dose Misc (Fluticasone-Salmeterol) .... Use One Time A Day 3)  Centrum Silver  Tabs (Multiple Vitamins-Minerals) .... Once Weekly 4)  Ultravate 0.05 %  Oint (Halobetasol Propionate) .... As Needed 5)  Antivert 25 Mg Tabs (Meclizine Hcl) .... One Tab By Mouth Every 6 Hrs As Needed Dizziness 6)  Garlique 400 Mg Tbec (Garlic) .[name].. 1 By Mouth Daily 7)  Lipitor 10 Mg Tabs (Atorvastatin Calcium) .[name].. 1 Daily For Cholesterol 8)  Xanax 0.5 Mg Tabs (Alprazolam) .[name].. 1 At Bedtime As Needed 9)  Albuterol Sulfate (2.5 Mg/67ml) 0.083% Nebu (Albuterol Sulfate) .... Use in Nebulizer Four Times Daily With Atrovent. 10)  Percocet 5-325 Mg Tabs (Oxycodone-Acetaminophen) .[name].. 1 Tab Every 6 Hours As Needed For Pain 11)  Losartan Potassium 100 Mg Tabs (Losartan Potassium) .[name].. 1 Tab By Mouth Daily. 12)  Ipratropium-Albuterol 0.5-2.5 (3) Mg/4ml Soln (Ipratropium-Albuterol) .... Use As Directed. 13)  Tramadol Hcl 50 Mg  Tabs (Tramadol Hcl) .[name].. 1 Tab By Mouth Two Times A Day As Needed Pain 14)  Azithromycin 250 Mg  Tabs (Azithromycin) .... 2 By  Mouth Today and Then 1 Daily For 4 Days  Allergies: 1)  ! * Avelox 2)  !  Sulfa 3)  ! Biaxin 4)  ! * Vicodin Tuss 5)  ! * Contrast Dye  Past History:  Past Medical History: Last updated: 06/19/2009 Colonic polyps, hx of COPD Diverticulosis, colon GERD Hypertension Osteoarthritis Hyperlipidemia aortic anursym in abdomen ovarian cyst L ovary AAA  Past Surgical History: Last updated: 06/19/2009 Hysterectomy--1975--ovaries intact Kidney surgery- stones Appendectomy Tonsillectomy DEXA + osteoporosis-- 04/01 EGD-- 9/98---03/29/08--stricture of esophagus and duodenitis wihtout hemorrhage Colonoscopy 9/98 -- 10/02 Left shoulder hemiarthroplasty-- 01/18/03 Carotid doppler 39% ICA --12/06 Right cataract-- 09/07--L cataract 6/09 CT Abd/pelvis- 2 renal stones, AAA-- 05/19/06 BA swallow--02/28/08--stricture AAA endovascular surgery 10/23/08  Family History: Last updated: 11/03/2008 Father: Deceased, MI Mother: Deceased, MI Siblings: One brother cancer- pancreas, 3 sisters CV + BP + Cancer + Gout/arthritis + DM - none Father DVT Family History Colon Cancer brother  Social History: Last updated: 03/28/2008 Marital Status: Married Children:  Occupation: retired Patient is a former smoker.  Alcohol Use - no Daily Caffeine Use 1/2 cup coffee every am and Diet Sprite during the day  Risk Factors: Smoking Status: quit > 6 months (06/19/2009)  Review of Systems      See HPI General:  Denies fever. CV:  Denies chest pain or discomfort and difficulty breathing at night. Resp:  Complains of cough; denies shortness of breath,  sputum productive, and wheezing.  Physical Exam  General:  alert, well-developed, well-nourished, and well-hydrated. no increased WOB. VS reviewed- normotensive O2 sat 95% Mouth:  Oral mucosa and oropharynx without lesions or exudates.  Teeth in good repair. Lungs:  normal respiratory effort, no intercostal retractions, and no accessory muscle use. diffuse exp wheezes. Heart:  normal rate, regular rhythm, and no murmur.    Psych:  normally interactive, moderately anxious   Impression & Recommendations:  Problem # 1:  COPD (ICD-496) Assessment Deteriorated  Given significant PMH, will treat with Zpack. Continue nebs as needed.  Pt to call me on Weds with an update. Her updated medication list for this problem includes:    Advair Diskus 250-50 Mcg/dose Misc (Fluticasone-salmeterol) ..... Use one time a day    Albuterol Sulfate (2.5 Mg/38ml) 0.083% Nebu (Albuterol sulfate) ..... Use in nebulizer four times daily with atrovent.    Ipratropium-albuterol 0.5-2.5 (3) Mg/55ml Soln (Ipratropium-albuterol) ..... Use as directed.  Her updated medication list for this problem includes:    Advair Diskus 250-50 Mcg/dose Misc (Fluticasone-salmeterol) ..... Use one time a day    Albuterol Sulfate (2.5 Mg/59ml) 0.083% Nebu (Albuterol sulfate) ..... Use in nebulizer four times daily with atrovent.    Ipratropium-albuterol 0.5-2.5 (3) Mg/68ml Soln (Ipratropium-albuterol) ..... Use as directed.  Complete Medication List: 1)  Nexium 40 Mg Cpdr (Esomeprazole magnesium) .... Take one by mouth once a day 2)  Advair Diskus 250-50 Mcg/dose Misc (Fluticasone-salmeterol) .... Use one time a day 3)  Centrum Silver Tabs (Multiple vitamins-minerals) .... Once weekly 4)  Ultravate 0.05 % Oint (Halobetasol propionate) .... As needed 5)  Antivert 25 Mg Tabs (Meclizine hcl) .... One tab by mouth every 6 hrs as needed dizziness 6)  Garlique 400 Mg Tbec (Garlic) .[name].. 1 by mouth daily 7)  Lipitor 10 Mg Tabs (Atorvastatin calcium) .[name].. 1 daily for cholesterol 8)  Xanax 0.5 Mg Tabs (Alprazolam) .[name].. 1 at bedtime as needed 9)  Albuterol Sulfate (2.5 Mg/52ml) 0.083% Nebu (Albuterol sulfate) .... Use in nebulizer four times daily with atrovent. 10)  Percocet 5-325 Mg Tabs (Oxycodone-acetaminophen) .[name].. 1 tab every 6 hours as needed for pain 11)  Losartan Potassium 100 Mg Tabs (Losartan potassium) .[name].. 1 tab by mouth daily. 12)   Ipratropium-albuterol 0.5-2.5 (3) Mg/59ml Soln (Ipratropium-albuterol) .... Use as directed. 13)  Tramadol Hcl 50 Mg Tabs (Tramadol hcl) .[name].. 1 tab by mouth two times a day as needed pain 14)  Azithromycin 250 Mg Tabs (Azithromycin) .... 2 by  mouth today and then 1 daily for 4 days Prescriptions: PERCOCET 5-325 MG TABS (OXYCODONE-ACETAMINOPHEN) 1 tab every 6 hours as needed for pain  #30 x 0   Entered and Authorized by:   [name] MD   Signed by:   [name] MD on 12/31/2009   Method used:   Print then Give to Patient   RxID:   [id number] AZITHROMYCIN 250 MG  TABS (AZITHROMYCIN) 2 by  mouth today and then 1 daily for 4 days  #6 x 0   Entered and Authorized by:   [name] MD   Signed by:   [name] MD on 12/31/2009   Method used:   [facility]        [facility] [facility]       [address]       [place], [place]  [address]       Ph: [phone] or [phone]       Fax: [phone]  RxID:   [id number]   Current Allergies (reviewed today): ! * AVELOX ! SULFA ! BIAXIN ! * VICODIN TUSS ! * CONTRAST DYE

## 2010-07-16 NOTE — Assessment & Plan Note (Signed)
Summary: Pulmonary OV   Copy to:  Josephina Gip Primary Provider/Referring Provider:  Jillyn Hidden  CC:  Follow up.  states she was putting christmas tree up and got exetremely SOB-was seen at [facility] by PCP.  was given prednisone taper and xopenex and ipratropium nebs and told to follow up with Pulmonary.  states these nebs work well.  having inc SOB with activity since May..  History of Present Illness: Pulmonary OV       This is a 77 year old woman  with COPD  Last seen 2008.  Dx COPD in past.  Has AAA and needs AAA surgery per [facility] Dr Hart [place].  Pt is dyspneic over past few years and worse over 6 months.  If walks more that 60ft is dyspneic.  If gets mucous in throat will cough.  No wheeze.  Mucous is clear.  No chest pain.  Rx for bronchitis 12/09 and none since.   June 19, 2009 11:40 AM Pt was last seen 4/10 and had AAA did endovascular stent 10/23/08 and did well. The pt notes more recent increase  in cough and dyspnea rx with xopenex/atrovent neb and pred by PCP.  The pt prefers xopenex over albuterol. Not much pndrip.    Preventive Screening-Counseling & Management  Alcohol-Tobacco     Smoking Status: quit > 6 months  Current Medications (verified): 1)  Nexium 40 Mg Cpdr (Esomeprazole Magnesium) .... Take One By Mouth Once A Day 2)  Advair Diskus 250-50 Mcg/dose Misc (Fluticasone-Salmeterol) .... Use One Time A Day 3)  Calcium 500 Mg Tabs (Calcium) .... Once Weekly 4)  Centrum Silver  Tabs (Multiple Vitamins-Minerals) .... Once Weekly 5)  Ultravate 0.05 %  Oint (Halobetasol Propionate) .... As Needed 6)  Cozaar 100 Mg  Tabs (Losartan Potassium) .... One Daily 7)  Antivert 25 Mg Tabs (Meclizine Hcl) .... One Tab By Mouth Every 6 Hrs As Needed Dizziness 8)  Ipratropium-Albuterol 0.5-2.5 (3) Mg/24ml Soln (Ipratropium-Albuterol) .... As Needed At Bedtime 9)  Garlique 400 Mg Tbec (Garlic) .[name].. 1 By Mouth Daily 10)  Lipitor 10 Mg Tabs (Atorvastatin Calcium) .[name].. 1 Daily For  Cholesterol 11)  Xanax 0.5 Mg Tabs (Alprazolam) .[name].. 1 At Bedtime As Needed 12)  Vicodin 5-500 Mg Tabs (Hydrocodone-Acetaminophen) .[name].. 1 Every 4 Hrs As Needed Cough  Allergies (verified): 1)  ! Percocet 2)  ! * Avelox 3)  ! Sulfa 4)  ! Biaxin 5)  ! * Vicodin Tuss 6)  ! * Contrast Dye  Past History:  Past medical, surgical, family and social histories (including risk factors) reviewed, and no changes noted (except as noted below).  Past Medical History: Colonic polyps, hx of COPD Diverticulosis, colon GERD Hypertension Osteoarthritis Hyperlipidemia aortic anursym in abdomen ovarian cyst L ovary AAA  Past Surgical History: Hysterectomy--1975--ovaries intact Kidney surgery- stones Appendectomy Tonsillectomy DEXA + osteoporosis-- 04/01 EGD-- 9/98---03/29/08--stricture of esophagus and duodenitis wihtout hemorrhage Colonoscopy 9/98 -- 10/02 Left shoulder hemiarthroplasty-- 01/18/03 Carotid doppler 39% ICA --12/06 Right cataract-- 09/07--L cataract 6/09 CT Abd/pelvis- 2 renal stones, AAA-- 05/19/06 BA swallow--02/28/08--stricture AAA endovascular surgery 10/23/08  Past Pulmonary History:  Pulmonary History: Pulmonary Function Test  Date: 10/20/2008 Height (in.): 61 Gender: Female  Pre-Spirometry  FVC     Value: 2.32 L/min   Pred: 2.36 L/min     % Pred: 96 % FEV1     Value: 0.98 L     Pred: 1.62 L     % Pred: 61 % FEV1/FVC   Value: 42 %  Pred: 71 %     FEF 25-75   Value: 0.26 L/min   Pred: 1.96 L/min     % Pred: 13 %  Post-Spirometry  FVC     Value: 2.35 L/min   Pred: 2.36 L/min     % Pred: 99 % FEV1     Value: 1.00 L     Pred: 1.62 L     % Pred: 62 % FEV1/FVC   Value: 42 %     Pred: 71 %     FEF 25-75   Value: 0.28 L/min   Pred: 1.96 L/min     % Pred: 14 %  Lung Volumes  TLC     Value: 8.98 L   % Pred: 215 % RV     Value: 6.66 L   % Pred: 384 % DLCO     Value: 7.0 %   % Pred: 44 % DLCO/VA   Value: 2.34 %   % Pred: 69 %  Comments:  Severe obstructive  lung disease,  not much change compared to 2006 values.  Family History: Reviewed history from 10/04/2008 and no changes required. Father: Deceased, MI Mother: Deceased, MI Siblings: One brother cancer- pancreas, 3 sisters CV + BP + Cancer + Gout/arthritis + DM - none Father DVT Family History Colon Cancer brother  Social History: Reviewed history from 03/28/2008 and no changes required. Marital Status: Married Children:  Occupation: retired Patient is a former smoker.  Alcohol Use - no Daily Caffeine Use 1/2 cup coffee every am and Diet Sprite during the day Smoking Status:  quit > 6 months  Review of Systems       The patient complains of shortness of breath with activity, shortness of breath at rest, and productive cough.  The patient denies non-productive cough, coughing up blood, chest pain, irregular heartbeats, acid heartburn, indigestion, loss of appetite, weight change, abdominal pain, difficulty swallowing, sore throat, tooth/dental problems, headaches, nasal congestion/difficulty breathing through nose, sneezing, itching, ear ache, anxiety, depression, hand/feet swelling, joint stiffness or pain, rash, change in color of mucus, and fever.    Vital Signs:  Patient profile:   77 year old female Height:      60 inches Weight:      130.38 pounds O2 Sat:      93 % on Room air Temp:     97.8 degrees F oral Pulse rate:   86 / minute BP sitting:   136 / 82  (left arm) Cuff size:   regular  Vitals Entered By: Gweneth Dimitri RN (June 19, 2009 11:29 AM)  O2 Flow:  Room air CC: Follow up.  states she was putting christmas tree up and got exetremely SOB-was seen at [facility] by PCP.  was given prednisone taper, xopenex and ipratropium nebs and told to follow up with Pulmonary.  states these nebs work well.  having inc SOB with activity since May. Comments Medications reviewed with patient Gweneth Dimitri RN  June 19, 2009 11:28 AM    Physical Exam  Additional  Exam:  Gen: Pleasant, well-nourished, in no distress,  normal affect ENT: No lesions,  mouth clear,  oropharynx clear, no postnasal drip Neck: No JVD, no TMG, no carotid bruits Lungs: No use of accessory muscles, no dullness to percussion, distant BS, prolonged exp phase Cardiovascular: RRR, heart sounds normal, no murmur or gallops, no peripheral edema Abdomen: soft and NT, no HSM,  BS normal Musculoskeletal: No deformities, no cyanosis or clubbing Neuro: alert,  non focal Skin: Warm, no lesions or rashes    Impression & Recommendations:  Problem # 1:  ACUTE BRONCHITIS (ICD-466.0) Assessment Deteriorated Acute tracheobronchitis with copd exac and flare plan Prednisone 10mg  4 each am x 4 days, 3 x 4 days, 2 x 4 days, 1 x 4 days then stop Stop albuterol Start Xopenex 0.63mg  in nebulizer three times daily ([facility]) Continue ipratropium in nebulzer 4 times daily([facility]) Stay on advair twice daily Return one month  Her updated medication list for this problem includes:    Advair Diskus 250-50 Mcg/dose Misc (Fluticasone-salmeterol) ..... Use one time a day    Xopenex 0.63 Mg/79ml Nebu (Levalbuterol hcl) ..... Use in nebulzer three times daily  Orders: Prescription Created Electronically [phone]) Est. Patient Level V [phone])  Medications Added to Medication List This Visit: 1)  Calcium 500 Mg Tabs (Calcium) .... Once weekly 2)  Centrum Silver Tabs (Multiple vitamins-minerals) .... Once weekly 3)  Cozaar 100 Mg Tabs (Losartan potassium) .... One daily 4)  Atrovent 0.03 % Soln (Ipratropium bromide) .... Use one in nebulizer four times daily 5)  Xopenex 0.63 Mg/68ml Nebu (Levalbuterol hcl) .... Use in nebulzer three times daily 6)  Prednisone 10 Mg Tabs (Prednisone) .... Take as directed 4 each am x 4 days, 3 x 4 days, 2 x 4 days, 1 x 4 days then stop  Complete Medication List: 1)  Nexium 40 Mg Cpdr (Esomeprazole magnesium) .... Take one by  mouth once a day 2)  Advair Diskus 250-50 Mcg/dose Misc (Fluticasone-salmeterol) .... Use one time a day 3)  Calcium 500 Mg Tabs (Calcium) .... Once weekly 4)  Centrum Silver Tabs (Multiple vitamins-minerals) .... Once weekly 5)  Ultravate 0.05 % Oint (Halobetasol propionate) .... As needed 6)  Cozaar 100 Mg Tabs (Losartan potassium) .... One daily 7)  Antivert 25 Mg Tabs (Meclizine hcl) .... One tab by mouth every 6 hrs as needed dizziness 8)  Atrovent 0.03 % Soln (Ipratropium bromide) .... Use one in nebulizer four times daily 9)  Garlique 400 Mg Tbec (Garlic) .[name].. 1 by mouth daily 10)  Lipitor 10 Mg Tabs (Atorvastatin calcium) .[name].. 1 daily for cholesterol 11)  Xanax 0.5 Mg Tabs (Alprazolam) .[name].. 1 at bedtime as needed 12)  Vicodin 5-500 Mg Tabs (Hydrocodone-acetaminophen) .[name].. 1 every 4 hrs as needed cough 13)  Xopenex 0.63 Mg/33ml Nebu (Levalbuterol hcl) .... Use in nebulzer three times daily 14)  Prednisone 10 Mg Tabs (Prednisone) .... Take as directed 4 each am x 4 days, 3 x 4 days, 2 x 4 days, 1 x 4 days then stop  Patient Instructions: 1)  Prednisone 10mg  4 each am x 4 days, 3 x 4 days, 2 x 4 days, 1 x 4 days then stop 2)  Stop albuterol 3)  Start Xopenex 0.63mg  in nebulizer three times daily ([facility]) 4)  Continue ipratropium in nebulzer 4 times daily([facility]) 5)  Stay on advair twice daily 6)  Return one month  Prescriptions: PREDNISONE 10 MG  TABS (PREDNISONE) Take as directed 4 each am x 4 days, 3 x 4 days, 2 x 4 days, 1 x 4 days then stop  #40 x 0   Entered and Authorized by:   Storm Frisk MD   Signed by:   Storm Frisk MD on 06/19/2009   Method used:   Electronically to  [facility] [id number]* (retail)       [address] [place], [place]  [address]       Ph: [phone]       Fax: [phone]   RxID:   [id number] WUXLKGM 0.63 MG/3ML NEBU (LEVALBUTEROL HCL) Use in nebulzer  three times daily  #90 x 6   Entered and Authorized by:   Storm Frisk MD   Signed by:   Storm Frisk MD on 06/19/2009   Method used:   Print then Give to Patient   RxID:   [id number] ATROVENT 0.03 % SOLN (IPRATROPIUM BROMIDE) Use one in nebulizer four times daily  #120 vials x 6   Entered and Authorized by:   Storm Frisk MD   Signed by:   Storm Frisk MD on 06/19/2009   Method used:   Print then Give to Patient   RxID:   [id number]

## 2010-07-16 NOTE — Letter (Signed)
Summary: [facility]  [facility]   Imported By: Maryln Gottron 05/10/2010 12:34:49  _____________________________________________________________________  External Attachment:    Type:   Image     Comment:   External Document

## 2010-07-18 NOTE — Assessment & Plan Note (Signed)
Summary: COLD/RBH   Vital Signs:  Patient profile:   77 year old female Height:      60 inches Weight:      128 pounds O2 Sat:      99 % Temp:     98 degrees F BP sitting:   140 / 89  History of Present Illness: 77 yo with h/o COPD with inc in cough and change/inc in sputum over past 3 days.   ST and feeling of irritation in throat.  No fevers but chills last night.  No diarrhea, + rhinorrhea this AM.  Wheezing more and coughing more at night (and during the day).  Some relief of cough with SABA.  Using SABA more than normal.    Tolerates zmax.  Prev best relief of cough with tussionex.  Current Medications (verified): 1)  Nexium 40 Mg Cpdr (Esomeprazole Magnesium) .... Take One By Mouth Once A Day 2)  Advair Diskus 250-50 Mcg/dose Misc (Fluticasone-Salmeterol) .... Use One Time A Day 3)  Centrum Silver  Tabs (Multiple Vitamins-Minerals) .... Once Weekly 4)  Ultravate 0.05 %  Oint (Halobetasol Propionate) .... As Needed 5)  Antivert 25 Mg Tabs (Meclizine Hcl) .... One Tab By Mouth Every 6 Hrs As Needed Dizziness 6)  Garlique 400 Mg Tbec (Garlic) .[name].. 1 By Mouth Daily 7)  Lipitor 10 Mg Tabs (Atorvastatin Calcium) .[name].. 1 Daily For Cholesterol 8)  Xanax 0.5 Mg Tabs (Alprazolam) .[name].. 1 At Bedtime As Needed 9)  Albuterol Sulfate (2.5 Mg/69ml) 0.083% Nebu (Albuterol Sulfate) .... Use in Nebulizer Four Times Daily With Atrovent. 10)  Percocet 5-325 Mg Tabs (Oxycodone-Acetaminophen) .[name].. 1 Tab Every 6 Hours As Needed For Pain 11)  Losartan Potassium 100 Mg Tabs (Losartan Potassium) .[name].. 1 Tab By Mouth Daily. 12)  Ipratropium-Albuterol 0.5-2.5 (3) Mg/58ml Soln (Ipratropium-Albuterol) .... Use As Directed. 13)  Tramadol Hcl 50 Mg  Tabs (Tramadol Hcl) .[name].. 1 Tab By Mouth Two Times A Day As Needed Pain 14)  Vicodin 5-500 Mg Tabs (Hydrocodone-Acetaminophen) .[name].. 1 Tab Every 6 Hours As Needed For Cough or Pain 15)  Zithromax 250 Mg Tabs (Azithromycin) .... 2 By Mouth Today and Then 1 By Mouth Once  Daily For 4 Days. 16)  Tussionex Pennkinetic Er 10-8 Mg/48ml Lqcr (Hydrocod Polst-Chlorphen Polst) .... 5ml By Mouth Two Times A Day For Cough With Sedation Caution 17)  Prednisone 10 Mg Tabs (Prednisone) .... 5 By Mouth X3 Days, 4po X3 Days, 3po X3days, 2po X2days, 1po X1days, Take With Food. 18)  Cheratussin Ac 100-10 Mg/72ml Syrp (Guaifenesin-Codeine) .... 5 Ml By Mouth As Needed Cough.  Allergies (verified): 1)  ! * Avelox 2)  ! Sulfa 3)  ! Biaxin 4)  ! * Vicodin Tuss 5)  ! * Contrast Dye  Past History:  Past Medical History: Last updated: 06/19/2009 Colonic polyps, hx of COPD Diverticulosis, colon GERD Hypertension Osteoarthritis Hyperlipidemia aortic anursym in abdomen ovarian cyst L ovary AAA  Past Surgical History: Last updated: 06/19/2009 Hysterectomy--1975--ovaries intact Kidney surgery- stones Appendectomy Tonsillectomy DEXA + osteoporosis-- 04/01 EGD-- 9/98---03/29/08--stricture of esophagus and duodenitis wihtout hemorrhage Colonoscopy 9/98 -- 10/02 Left shoulder hemiarthroplasty-- 01/18/03 Carotid doppler 39% ICA --12/06 Right cataract-- 09/07--L cataract 6/09 CT Abd/pelvis- 2 renal stones, AAA-- 05/19/06 BA swallow--02/28/08--stricture AAA endovascular surgery 10/23/08  Family History: Last updated: 10/17/08 Father: Deceased, MI Mother: Deceased, MI Siblings: One brother cancer- pancreas, 3 sisters CV + BP + Cancer + Gout/arthritis + DM - none Father DVT Family History Colon Cancer brother  Social History: Last  updated: 03/28/2008 Marital Status: Married Children:  Occupation: retired Patient is a former smoker.  Alcohol Use - no Daily Caffeine Use 1/2 cup coffee every am and Diet Sprite during the day  Risk Factors: Smoking Status: quit > 6 months (06/19/2009)  Review of Systems      See HPI General:  Complains of chills; denies fever. CV:  Denies chest pain or discomfort. Resp:  Complains of cough, shortness of breath, sputum  productive, and wheezing.  Physical Exam  General:  GEN: nad, alert and oriented HEENT: mucous membranes moist, TM wnl, no OP exudates NECK: supple w/o LA CV: rrr.   PULM:  no inc wob but scattered exp wheeze and pseudowheeze with forces expiration.  ABD: soft, +bs EXT: no edema SKIN: no acute rash    Impression & Recommendations:  Problem # 1:  COPD (ICD-496) Assessment Deteriorated Will treat for COPD exacerbation with steroid burst and Zpack. Continue nebs and advair.  See pt instructions for details. Her updated medication list for this problem includes:    Advair Diskus 250-50 Mcg/dose Misc (Fluticasone-salmeterol) ..... Use one time a day    Albuterol Sulfate (2.5 Mg/23ml) 0.083% Nebu (Albuterol sulfate) ..... Use in nebulizer four times daily with atrovent.    Ipratropium-albuterol 0.5-2.5 (3) Mg/75ml Soln (Ipratropium-albuterol) ..... Use as directed.  Complete Medication List: 1)  Nexium 40 Mg Cpdr (Esomeprazole magnesium) .... Take one by mouth once a day 2)  Advair Diskus 250-50 Mcg/dose Misc (Fluticasone-salmeterol) .... Use one time a day 3)  Centrum Silver Tabs (Multiple vitamins-minerals) .... Once weekly 4)  Ultravate 0.05 % Oint (Halobetasol propionate) .... As needed 5)  Antivert 25 Mg Tabs (Meclizine hcl) .... One tab by mouth every 6 hrs as needed dizziness 6)  Garlique 400 Mg Tbec (Garlic) .[name].. 1 by mouth daily 7)  Lipitor 10 Mg Tabs (Atorvastatin calcium) .[name].. 1 daily for cholesterol 8)  Xanax 0.5 Mg Tabs (Alprazolam) .[name].. 1 at bedtime as needed 9)  Albuterol Sulfate (2.5 Mg/52ml) 0.083% Nebu (Albuterol sulfate) .... Use in nebulizer four times daily with atrovent. 10)  Percocet 5-325 Mg Tabs (Oxycodone-acetaminophen) .[name].. 1 tab every 6 hours as needed for pain 11)  Losartan Potassium 100 Mg Tabs (Losartan potassium) .[name].. 1 tab by mouth daily. 12)  Ipratropium-albuterol 0.5-2.5 (3) Mg/30ml Soln (Ipratropium-albuterol) .... Use as directed. 13)  Tramadol Hcl 50  Mg Tabs (Tramadol hcl) .[name].. 1 tab by mouth two times a day as needed pain 14)  Vicodin 5-500 Mg Tabs (Hydrocodone-acetaminophen) .[name].. 1 tab every 6 hours as needed for cough or pain 15)  Zithromax 250 Mg Tabs (Azithromycin) .... 2 by mouth today and then 1 by mouth once daily for 4 days. 16)  Tussionex Pennkinetic Er 10-8 Mg/51ml Lqcr (Hydrocod polst-chlorphen polst) .... 5ml by mouth two times a day for cough with sedation caution 17)  Prednisone 10 Mg Tabs (Prednisone) .... 5 by mouth x3 days, 4po x3 days, 3po x3days, 2po x2days, 1po x1days, take with food. 18)  Cheratussin Ac 100-10 Mg/36ml Syrp (Guaifenesin-codeine) .... 5 ml by mouth as needed cough.  Patient Instructions: 1)  Start the antibiotics today along with the prednisone.  Take the prednisone with food.  If you have more shortness of breath, go to the ER.  I would expect this to get better over the next few days.  Take care. Prescriptions: VICODIN 5-500 MG TABS (HYDROCODONE-ACETAMINOPHEN) 1 tab every 6 hours as needed for cough or pain  #60 x 0   Entered and  Authorized by:   [name] MD   Signed by:   [name] MD on 05/27/2010   Method used:   Print then Give to Patient   RxID:   [id number] ZITHROMAX 250 MG TABS (AZITHROMYCIN) 2 by mouth today and then 1 by mouth once daily for 4 days.  #6 x 0   Entered and Authorized by:   [name] MD   Signed by:   [name] MD on 05/27/2010   Method used:   Print then Give to Patient   RxID:   [id number] PREDNISONE 10 MG TABS (PREDNISONE) 5 by mouth x3 days, 4po x3 days, 3po x3days, 2po x2days, 1po x1days, take with food.  #41 x 0   Entered and Authorized by:   [name] MD   Signed by:   [name] MD on 05/27/2010   Method used:   Print then Give to Patient   RxID:   [id number]    Orders Added: 1)  Est. Patient Level IV [93235]

## 2010-07-18 NOTE — Progress Notes (Signed)
Summary: xanax  Phone Note Refill Request Message from:  Fax from Pharmacy on July 01, 2010 11:37 AM  Refills Requested: Medication #1:  XANAX 0.5 MG TABS 1 at bedtime as needed [BMN]   Last Refilled: 04/11/2010 Refill request from [facility]. [phone].   Initial call taken by: Melody Comas,  July 01, 2010 11:38 AM  Follow-up for Phone Call        [facility]. Follow-up by: [name] CMA Duncan Dull),  July 01, 2010 11:47 AM    Prescriptions: XANAX 0.5 MG TABS (ALPRAZOLAM) 1 at bedtime as needed Brand medically necessary #90 x 0   Entered and Authorized by:   [name] MD   Signed by:   [name] MD on 07/01/2010   Method used:   Telephoned to ...       [facility] (retail)       [address]       [place], [place]  [address]       Ph: [phone] or [phone]       Fax: [phone]   RxID:   [id number]

## 2010-07-31 ENCOUNTER — Other Ambulatory Visit: Payer: Self-pay | Admitting: Gynecologic Oncology

## 2010-07-31 ENCOUNTER — Ambulatory Visit (HOSPITAL_COMMUNITY)
Admission: RE | Admit: 2010-07-31 | Discharge: 2010-07-31 | Disposition: A | Discharge status: Home / Self Care | Payer: Medicare HMO | Source: Ambulatory Visit | Attending: Gynecologic Oncology | Admitting: Gynecologic Oncology

## 2010-07-31 ENCOUNTER — Ambulatory Visit: Payer: Medicare HMO | Attending: Gynecologic Oncology | Admitting: Gynecologic Oncology

## 2010-07-31 DIAGNOSIS — N838 Other noninflammatory disorders of ovary, fallopian tube and broad ligament: Secondary | ICD-10-CM | POA: Insufficient documentation

## 2010-07-31 DIAGNOSIS — N9489 Other specified conditions associated with female genital organs and menstrual cycle: Secondary | ICD-10-CM | POA: Insufficient documentation

## 2010-07-31 DIAGNOSIS — N949 Unspecified condition associated with female genital organs and menstrual cycle: Secondary | ICD-10-CM | POA: Insufficient documentation

## 2010-07-31 DIAGNOSIS — R102 Pelvic and perineal pain: Secondary | ICD-10-CM

## 2010-08-01 NOTE — Consult Note (Signed)
  NAMECARAGH, GASPER                 ACCOUNT NO.:  [id number]  MEDICAL RECORD NO.:  [id number]           PATIENT TYPE:  O  LOCATION:  U                            FACILITY[facility]  [facility]  PHYSICIAN:  Laurette Schimke, MD     DATE OF BIRTH:  May 11, 1931  DATE OF CONSULTATION:  07/31/2010 DATE OF DISCHARGE:                                CONSULTATION   REASON FOR VISIT:  Followup of a simple adnexal cyst.  HISTORY OF PRESENT ILLNESS:  This is a 77 year old gravida 4, para 4, who in May 2011, was noted to have a cyst measuring 5.2 cm in greatest dimension.  Repeat ultrasound in July 2011 demonstrates an increase in size of the cyst measuring 5.7 cm.  No blood flow was seen.  No free fluid.  CA-125 returned a value of 16.5.  Ms. Dority reported vague amount of abdominal pain.  She presents today for followup 6 months later.  Repeat ultrasound was obtained and this ultrasound demonstrated a cyst measuring 5.4 x 3.7 x 4.6 cm, which represents no change from the prior examination.  The cyst on the left adnexa.  Ms Melikian reports vague, intermittent pelvic discomfort.  PAST MEDICAL HISTORY:  Unchanged.  PAST SURGICAL HISTORY:  Unchanged.  FAMILY HISTORY:  Unchanged.  SOCIAL HISTORY:  She is married. She is nontobacco user.  REVIEW OF SYSTEMS:  10-point review of systems noteworthy for some weight gain.  No abdominal bloating.  No changes in bowel or rectal habits.  Stable COPD.  She has currently active psoriatic lesions.  PHYSICAL EXAMINATION:  GENERAL:  Well-developed, thin female in no acute distress. VITAL SIGNS:  Weight 129 pounds, blood pressure 122/72, pulse of 64. ABDOMEN:  Soft, nontender.  No palpable masses.  No omental cake. PELVIC:  Normal external genitalia, Bartholin, urethral, and Skene.  No palpable masses appreciated in the cul-de-sac.  No nodularity.  Good anal sphincter without any masses.  IMPRESSION:  Ms. Eichler has a stable simple left adnexal cyst that is asymptomatic.   I have suggested final ultrasound in 1 year and if that is within normal limits, then no additional surveillance is necessary unless she becomes symptomatic.     Laurette Schimke, MD     WB/MEDQ  D:  07/31/2010  T:  08/01/2010  Job:  [id number]  cc:   Juluis Mire, M.D. Fax: [phone]  [name], M.D.  Quita Skye Hart [place], M.D. [address] [place] [address]  Telford Nab, R.N. [phone] [address] [place], [place] [address]  Charlcie Cradle. Delford Field, MD, FCCP [address] [place] [address]  Electronically Signed by Laurette Schimke MD on 08/01/2010 11:09:14 AM

## 2010-08-05 ENCOUNTER — Ambulatory Visit (INDEPENDENT_AMBULATORY_CARE_PROVIDER_SITE_OTHER): Payer: Medicare HMO | Admitting: Family Medicine

## 2010-08-05 ENCOUNTER — Encounter: Payer: Self-pay | Admitting: Family Medicine

## 2010-08-05 DIAGNOSIS — J449 Chronic obstructive pulmonary disease, unspecified: Secondary | ICD-10-CM

## 2010-08-05 DIAGNOSIS — J4489 Other specified chronic obstructive pulmonary disease: Secondary | ICD-10-CM

## 2010-08-12 ENCOUNTER — Telehealth: Payer: Self-pay | Admitting: Family Medicine

## 2010-08-12 ENCOUNTER — Encounter: Payer: Self-pay | Admitting: Family Medicine

## 2010-08-13 NOTE — Assessment & Plan Note (Signed)
Summary: DISCUSS MEDICATION/CLE   HUMANA   Vital Signs:  Patient profile:   77 year old female Height:      60 inches Weight:      130.25 pounds O2 Sat:      95 % on Room air Temp:     98.6 degrees F oral Pulse rate:   80 / minute Pulse rhythm:   regular BP sitting:   130 / 90  (right arm) Cuff size:   regular  Vitals Entered By: Linde Gillis CMA Duncan Dull) (August 05, 2010 11:40 AM)  O2 Flow:  Room air CC: discuss medications, shortness of breath   History of Present Illness: 77 yo with h/o COPD with inc dry cough, wheezing and SOB over past several days.  No congestion, runny nose, or ST. No fevers or chills.   Some relief of cough with SABA.  Using SABA more than normal.  Feels like she has become progressively short of breath with exertion over past several months.      Current Medications (verified): 1)  Nexium 40 Mg Cpdr (Esomeprazole Magnesium) .... Take One By Mouth Once A Day 2)  Advair Diskus 250-50 Mcg/dose Misc (Fluticasone-Salmeterol) .... Use One Time A Day 3)  Centrum Silver  Tabs (Multiple Vitamins-Minerals) .... Once Weekly 4)  Ultravate 0.05 %  Oint (Halobetasol Propionate) .... As Needed 5)  Antivert 25 Mg Tabs (Meclizine Hcl) .... One Tab By Mouth Every 6 Hrs As Needed Dizziness 6)  Garlique 400 Mg Tbec (Garlic) .[name].. 1 By Mouth Daily 7)  Lipitor 10 Mg Tabs (Atorvastatin Calcium) .[name].. 1 Daily For Cholesterol 8)  Xanax 0.5 Mg Tabs (Alprazolam) .[name].. 1 At Bedtime As Needed 9)  Albuterol Sulfate (2.5 Mg/38ml) 0.083% Nebu (Albuterol Sulfate) .... Use in Nebulizer Four Times Daily With Atrovent. 10)  Percocet 5-325 Mg Tabs (Oxycodone-Acetaminophen) .[name].. 1 Tab Every 6 Hours As Needed For Pain 11)  Losartan Potassium 100 Mg Tabs (Losartan Potassium) .[name].. 1 Tab By Mouth Daily. 12)  Ipratropium-Albuterol 0.5-2.5 (3) Mg/67ml Soln (Ipratropium-Albuterol) .... Use As Directed. 13)  Tramadol Hcl 50 Mg  Tabs (Tramadol Hcl) .[name].. 1 Tab By Mouth Two Times A Day As Needed Pain 14)   Vicodin 5-500 Mg Tabs (Hydrocodone-Acetaminophen) .[name].. 1 Tab Every 6 Hours As Needed For Cough or Pain 15)  Zithromax 250 Mg Tabs (Azithromycin) .... 2 By Mouth Today and Then 1 By Mouth Once Daily For 4 Days. 16)  Tussionex Pennkinetic Er 10-8 Mg/5ml Lqcr (Hydrocod Polst-Chlorphen Polst) .... 5ml By Mouth Two Times A Day For Cough With Sedation Caution 17)  Prednisone 10 Mg Tabs (Prednisone) .... 5 By Mouth X3 Days, 4po X3 Days, 3po X3days, 2po X2days, 1po X1days, Take With Food. 18)  Cheratussin Ac 100-10 Mg/11ml Syrp (Guaifenesin-Codeine) .... 5 Ml By Mouth As Needed Cough. 19)  Crestor 10 Mg Tabs (Rosuvastatin Calcium) .[name].. 1 Tablet By Mouth Daily  Allergies: 1)  ! * Avelox 2)  ! Sulfa 3)  ! Biaxin 4)  ! * Vicodin Tuss 5)  ! * Contrast Dye  Past History:  Past Medical History: Last updated: 06/19/2009 Colonic polyps, hx of COPD Diverticulosis, colon GERD Hypertension Osteoarthritis Hyperlipidemia aortic anursym in abdomen ovarian cyst L ovary AAA  Past Surgical History: Last updated: 06/19/2009 Hysterectomy--1975--ovaries intact Kidney surgery- stones Appendectomy Tonsillectomy DEXA + osteoporosis-- 04/01 EGD-- 9/98---03/29/08--stricture of esophagus and duodenitis wihtout hemorrhage Colonoscopy 9/98 -- 10/02 Left shoulder hemiarthroplasty-- 01/18/03 Carotid doppler 39% ICA --12/06 Right cataract-- 09/07--L cataract 6/09 CT Abd/pelvis- 2 renal  stones, AAA-- 05/19/06 BA swallow--02/28/08--stricture AAA endovascular surgery 10/23/08  Family History: Last updated: Oct 20, 2008 Father: Deceased, MI Mother: Deceased, MI Siblings: One brother cancer- pancreas, 3 sisters CV + BP + Cancer + Gout/arthritis + DM - none Father DVT Family History Colon Cancer brother  Social History: Last updated: 03/28/2008 Marital Status: Married Children:  Occupation: retired Patient is a former smoker.  Alcohol Use - no Daily Caffeine Use 1/2 cup coffee every am and Diet  Sprite during the day  Risk Factors: Smoking Status: quit > 6 months (06/19/2009)  Review of Systems      See HPI General:  Denies chills and fever. CV:  Denies chest pain or discomfort. Resp:  Complains of cough, shortness of breath, and wheezing; denies sputum productive.  Physical Exam  General:  GEN: nad, alert and oriented HEENT: mucous membranes moist, TM wnl, no OP exudates NECK: supple w/o LA CV: rrr.   PULM:  no inc wob but scattered exp wheeze and pseudowheeze with forces expiration.  ABD: soft, +bs EXT: no edema SKIN: no acute rash    Impression & Recommendations:  Problem # 1:  COPD (ICD-496) Assessment Deteriorated Lung sounds are only slightly worse then usual.  Does not appear to be an infectious cause but does have worsening of lung function.  O2 sats dropped to 88% on RA with ambulation. Placed order for home O2 per [place].   Prednisone burst and continue inhalers/nebs. Her updated medication list for this problem includes:    Advair Diskus 250-50 Mcg/dose Misc (Fluticasone-salmeterol) ..... Use one time a day    Albuterol Sulfate (2.5 Mg/15ml) 0.083% Nebu (Albuterol sulfate) ..... Use in nebulizer four times daily with atrovent.    Ipratropium-albuterol 0.5-2.5 (3) Mg/73ml Soln (Ipratropium-albuterol) ..... Use as directed.  Orders: Misc. Referral (Misc. Ref)  Complete Medication List: 1)  Nexium 40 Mg Cpdr (Esomeprazole magnesium) .... Take one by mouth once a day 2)  Advair Diskus 250-50 Mcg/dose Misc (Fluticasone-salmeterol) .... Use one time a day 3)  Centrum Silver Tabs (Multiple vitamins-minerals) .... Once weekly 4)  Ultravate 0.05 % Oint (Halobetasol propionate) .... As needed 5)  Antivert 25 Mg Tabs (Meclizine hcl) .... One tab by mouth every 6 hrs as needed dizziness 6)  Garlique 400 Mg Tbec (Garlic) .[name].. 1 by mouth daily 7)  Lipitor 10 Mg Tabs (Atorvastatin calcium) .[name].. 1 daily for cholesterol 8)  Xanax 0.5 Mg Tabs (Alprazolam) .[name].. 1 at bedtime  as needed 9)  Albuterol Sulfate (2.5 Mg/63ml) 0.083% Nebu (Albuterol sulfate) .... Use in nebulizer four times daily with atrovent. 10)  Percocet 5-325 Mg Tabs (Oxycodone-acetaminophen) .[name].. 1 tab every 6 hours as needed for pain 11)  Losartan Potassium 100 Mg Tabs (Losartan potassium) .[name].. 1 tab by mouth daily. 12)  Ipratropium-albuterol 0.5-2.5 (3) Mg/52ml Soln (Ipratropium-albuterol) .... Use as directed. 13)  Tramadol Hcl 50 Mg Tabs (Tramadol hcl) .[name].. 1 tab by mouth two times a day as needed pain 14)  Vicodin 5-500 Mg Tabs (Hydrocodone-acetaminophen) .[name].. 1 tab every 6 hours as needed for cough or pain 15)  Zithromax 250 Mg Tabs (Azithromycin) .... 2 by mouth today and then 1 by mouth once daily for 4 days. 16)  Tussionex Pennkinetic Er 10-8 Mg/78ml Lqcr (Hydrocod polst-chlorphen polst) .... 5ml by mouth two times a day for cough with sedation caution 17)  Prednisone 10 Mg Tabs (Prednisone) .... 5 by mouth x3 days, 4po x3 days, 3po x3days, 2po x2days, 1po x1days, take with food. 18)  Cheratussin Ac  100-10 Mg/24ml Syrp (Guaifenesin-codeine) .... 5 ml by mouth as needed cough. 19)  Crestor 10 Mg Tabs (Rosuvastatin calcium) .[name].. 1 tablet by mouth daily  Patient Instructions: 1)  please stop by to see Shirlee Limerick on your way out. Prescriptions: VICODIN 5-500 MG TABS (HYDROCODONE-ACETAMINOPHEN) 1 tab every 6 hours as needed for cough or pain  #60 x 0   Entered and Authorized by:   [name] MD   Signed by:   [name] MD on 08/05/2010   Method used:   Print then Give to Patient   RxID:   [id number] PREDNISONE 10 MG TABS (PREDNISONE) 5 by mouth x3 days, 4po x3 days, 3po x3days, 2po x2days, 1po x1days, take with food.  #41 x 0   Entered and Authorized by:   [name] MD   Signed by:   [name] MD on 08/05/2010   Method used:   Electronically to        [facility] (retail)       [address]       [place], [place]  [address]       Ph: [phone] or [phone]        Fax: [phone]   RxID:   [id number]    Orders Added: 1)  Misc. Referral [Misc. Ref] 2)  Est. Patient Level IV [57322]    Current Allergies (reviewed today): ! * AVELOX ! SULFA ! BIAXIN ! * VICODIN TUSS ! * CONTRAST DYE     Ambulatory Pulse Oximetry  Resting; HR__80___    02 Sat__95___  Lap1 (185 feet)   HR__88___   02 Sat___93__ Lap2 (185 feet)   HR__90__   02 Sat___90__    Lap3 (185 feet)   HR__92___   02 Sat___88__  _y__Test Completed without Difficulty ___Test Stopped due to: Patient was to short of breat to continue    Appended Document: DISCUSS MEDICATION/CLE   [facility] patient on oxygen for 5 minutes, 02Sat came up from 88 to 93.

## 2010-08-15 ENCOUNTER — Encounter: Payer: Self-pay | Admitting: Family Medicine

## 2010-08-20 ENCOUNTER — Encounter: Payer: Self-pay | Admitting: Family Medicine

## 2010-08-22 NOTE — Progress Notes (Signed)
Summary: Home oxygen through [facility]  Phone Note Call from Patient Call back at Home Phone [phone]   Caller: Patient Call For: Amanda Mannan MD Summary of Call: Patient called because she was set up with home oxygen through [facility].  She stated that she did not want [facility] to begin with because they are out of network with her insurance company.  [facility] will not pick up her oxygen until she receives oxygen from [place].  Patient requested that I call [facility] and speak with Tye at [phone] to see if I can get this taken care of today.  I called and left a voicemail for Tye to call back with specific instructions on how we can get [name] her oxygen. Initial call taken by: Linde Gillis CMA Duncan Dull),  August 12, 2010 3:10 PM  Follow-up for Phone Call        Left a message on voicemail for Tye to return my call.  Linde Gillis CMA Duncan Dull)  August 13, 2010 8:01 AM   Spoke with Deloris Ping at [place] and she states that she needs an order for [name] to supply her with oxygen.  Rx needs to include: 1) Liter flow of oxygen, 2) Portable oxygen for daily use/24 hour use, and 3) Order for Nebulizer and supplies.  I will fax order to Deloris Ping today at [phone] and she will get Ms. Branscomb on the delivery schedule.  Linde Gillis CMA Duncan Dull)  August 13, 2010 9:34 AM   Additional Follow-up for Phone Call Additional follow up Details #1::        Does this need to be written on a prescription pad or is there a form I need to fill out? Amanda Mannan MD  August 13, 2010 9:36 AM  Per Deloris Ping writting it on a Rx will be fine.  Linde Gillis CMA Duncan Dull)  August 13, 2010 9:40 AM      Additional Follow-up for Phone Call Additional follow up Details #2::    rx in my box. Amanda Mannan MD  August 13, 2010 10:19 AM  Order faxed to Tye at [phone], patient notified via telephone. Follow-up by: Linde Gillis CMA Duncan Dull),  August 13, 2010 10:34 AM

## 2010-08-22 NOTE — Miscellaneous (Signed)
Summary: Order for Oxygen/[facility]  Order for Oxygen/[facility]   Imported By: Maryln Gottron 08/14/2010 10:19:15  _____________________________________________________________________  External Attachment:    Type:   Image     Comment:   External Document

## 2010-08-27 NOTE — Letter (Signed)
Summary: [facility] - DME  [facility] - D[facility]   Imported By: Kassie Mends 08/19/2010 11:09:44  _____________________________________________________________________  External Attachment:    Type:   Image     Comment:   External Document

## 2010-08-31 ENCOUNTER — Encounter: Payer: Self-pay | Admitting: Family Medicine

## 2010-08-31 LAB — COMPREHENSIVE METABOLIC PANEL
ALT: 16 U/L (ref 0–35)
Calcium: 9.2 mg/dL (ref 8.4–10.5)
Creatinine, Ser: 0.95 mg/dL (ref 0.4–1.2)
GFR calc non Af Amer: 57 mL/min — ABNORMAL LOW (ref >60)
Glucose, Bld: 141 mg/dL — ABNORMAL HIGH (ref 70–99)
Sodium: 136 mEq/L (ref 135–145)
Total Protein: 7.1 g/dL (ref 6.0–8.3)

## 2010-08-31 LAB — CBC
Hemoglobin: 14.9 g/dL (ref 12.0–15.0)
MCHC: 33.5 g/dL (ref 30.0–36.0)
MCV: 88.7 fL (ref 78.0–100.0)
RDW: 13.9 % (ref 11.5–15.5)

## 2010-08-31 LAB — DIFFERENTIAL
Eosinophils Absolute: 0.2 10*3/uL (ref 0.0–0.7)
Lymphocytes Relative: 30 % (ref 12–46)
Lymphs Abs: 2.3 10*3/uL (ref 0.7–4.0)
Monocytes Relative: 12 % (ref 3–12)
Neutro Abs: 4.3 10*3/uL (ref 1.7–7.7)
Neutrophils Relative %: 56 % (ref 43–77)

## 2010-08-31 LAB — URINALYSIS, ROUTINE W REFLEX MICROSCOPIC
Glucose, UA: NEGATIVE mg/dL
Nitrite: NEGATIVE
Specific Gravity, Urine: 1.02 (ref 1.005–1.030)
pH: 5.5 (ref 5.0–8.0)

## 2010-08-31 LAB — URINE MICROSCOPIC-ADD ON

## 2010-08-31 LAB — URINE CULTURE: Colony Count: >=100000

## 2010-08-31 LAB — POCT URINALYSIS DIP (DEVICE)
Bilirubin Urine: NEGATIVE
Glucose, UA: NEGATIVE mg/dL
Hgb urine dipstick: NEGATIVE
Ketones, ur: NEGATIVE mg/dL
Specific Gravity, Urine: 1.01 (ref 1.005–1.030)
Urobilinogen, UA: 0.2 mg/dL (ref 0.0–1.0)

## 2010-08-31 LAB — LIPASE, BLOOD: Lipase: 15 U/L (ref 11–59)

## 2010-09-03 NOTE — Letter (Signed)
Summary: [facility]   [facility]   Imported By: Kassie Mends 08/26/2010 10:28:28  _____________________________________________________________________  External Attachment:    Type:   Image     Comment:   External Document

## 2010-09-16 ENCOUNTER — Other Ambulatory Visit: Payer: Self-pay | Admitting: *Deleted

## 2010-09-16 MED ORDER — ALPRAZOLAM 0.5 MG PO TABS: 0.5000 mg | ORAL_TABLET | Freq: Every evening | ORAL | Status: DC | PRN

## 2010-09-16 NOTE — Telephone Encounter (Signed)
Rx called to pharmacy

## 2010-09-24 LAB — URINALYSIS, ROUTINE W REFLEX MICROSCOPIC
Nitrite: NEGATIVE
Specific Gravity, Urine: 1.022 (ref 1.005–1.030)
Urobilinogen, UA: 0.2 mg/dL (ref 0.0–1.0)
pH: 6 (ref 5.0–8.0)

## 2010-09-24 LAB — BLOOD GAS, ARTERIAL
Drawn by: 206361
Patient temperature: 98.6
TCO2: 25.1 mmol/L (ref 0–100)
pCO2 arterial: 40.8 mmHg (ref 35.0–45.0)
pH, Arterial: 7.385 (ref 7.350–7.400)

## 2010-09-24 LAB — CBC
HCT: 31.8 % — ABNORMAL LOW (ref 36.0–46.0)
HCT: 41.4 % (ref 36.0–46.0)
Hemoglobin: 10.9 g/dL — ABNORMAL LOW (ref 12.0–15.0)
Hemoglobin: 14.1 g/dL (ref 12.0–15.0)
MCHC: 34.2 g/dL (ref 30.0–36.0)
MCHC: 34.2 g/dL (ref 30.0–36.0)
MCV: 89.2 fL (ref 78.0–100.0)
Platelets: 160 10*3/uL (ref 150–400)
Platelets: 254 10*3/uL (ref 150–400)
RBC: 4.64 MIL/uL (ref 3.87–5.11)
RDW: 13.4 % (ref 11.5–15.5)
RDW: 13.7 % (ref 11.5–15.5)
RDW: 13.8 % (ref 11.5–15.5)
WBC: 5.6 10*3/uL (ref 4.0–10.5)

## 2010-09-24 LAB — URINALYSIS, MICROSCOPIC ONLY
Bilirubin Urine: NEGATIVE
Nitrite: NEGATIVE
Specific Gravity, Urine: 1.015 (ref 1.005–1.030)
Urobilinogen, UA: 0.2 mg/dL (ref 0.0–1.0)

## 2010-09-24 LAB — COMPREHENSIVE METABOLIC PANEL
AST: 19 U/L (ref 0–37)
Albumin: 3.8 g/dL (ref 3.5–5.2)
BUN: 18 mg/dL (ref 6–23)
Chloride: 109 mEq/L (ref 96–112)
Creatinine, Ser: 0.66 mg/dL (ref 0.4–1.2)
GFR calc Af Amer: >60 mL/min (ref >60)
Total Protein: 6.4 g/dL (ref 6.0–8.3)

## 2010-09-24 LAB — BASIC METABOLIC PANEL
BUN: 13 mg/dL (ref 6–23)
CO2: 28 mEq/L (ref 19–32)
Calcium: 8.1 mg/dL — ABNORMAL LOW (ref 8.4–10.5)
Creatinine, Ser: 0.63 mg/dL (ref 0.4–1.2)
GFR calc non Af Amer: >60 mL/min (ref >60)
Glucose, Bld: 114 mg/dL — ABNORMAL HIGH (ref 70–99)
Glucose, Bld: 129 mg/dL — ABNORMAL HIGH (ref 70–99)
Potassium: 3.7 mEq/L (ref 3.5–5.1)
Sodium: 137 mEq/L (ref 135–145)

## 2010-09-24 LAB — CROSSMATCH
ABO/RH(D): A POS
Antibody Screen: NEGATIVE

## 2010-09-24 LAB — ABO/RH: ABO/RH(D): A POS

## 2010-09-24 LAB — APTT: aPTT: 27 seconds (ref 24–37)

## 2010-09-24 LAB — PROTIME-INR: INR: 0.9 (ref 0.00–1.49)

## 2010-10-01 LAB — CREATININE, SERUM: GFR calc Af Amer: >60 mL/min (ref >60)

## 2010-10-14 ENCOUNTER — Encounter: Payer: Self-pay | Admitting: Family Medicine

## 2010-10-14 ENCOUNTER — Ambulatory Visit (INDEPENDENT_AMBULATORY_CARE_PROVIDER_SITE_OTHER): Payer: Medicare HMO | Admitting: Family Medicine

## 2010-10-14 ENCOUNTER — Other Ambulatory Visit: Payer: Self-pay | Admitting: Family Medicine

## 2010-10-14 VITALS — BP 150/80 | HR 98 | Temp 97.5°F | Ht 60.0 in | Wt 131.4 lb

## 2010-10-14 DIAGNOSIS — J4489 Other specified chronic obstructive pulmonary disease: Secondary | ICD-10-CM

## 2010-10-14 DIAGNOSIS — J449 Chronic obstructive pulmonary disease, unspecified: Secondary | ICD-10-CM

## 2010-10-14 MED ORDER — PREDNISONE 10 MG PO TABS: ORAL_TABLET | ORAL | Status: DC

## 2010-10-14 MED ORDER — HYDROCODONE-ACETAMINOPHEN 5-500 MG PO TABS: 1.0000 | ORAL_TABLET | Freq: Four times a day (QID) | ORAL | Status: DC | PRN

## 2010-10-14 NOTE — Assessment & Plan Note (Signed)
Deteriorated. >25 min spent with face to face with patient counseling and coordinating care  iscussed importance of not using prednisone unless absolutely necessary. Will give short steroid burst, no abx unless symptoms worsen.

## 2010-10-14 NOTE — Progress Notes (Signed)
77 yo with h/o COPD with inc in cough and change/inc in sputum over past 3 days.   ST and feeling of irritation in throat.  No fevers but chills last night.  No diarrhea, + rhinorrhea this AM.    Wheezing more and coughing more at night (and during the day).  Some relief of cough with SABA.  Using SABA more than normal.    Gets very anxious when she starts to wheeze, frequently asks for oral prednisone.   Review of Systems       See HPI General:  Complains of chills; denies fever. CV:  Denies chest pain or discomfort. Resp:  Complains of cough, shortness of breath, sputum productive, and wheezing.  Physical Exam BP 150/80  Pulse 98  Temp(Src) 97.5 F (36.4 C) (Oral)  Ht 5' (1.524 m)  Wt 131 lb 6.4 oz (59.603 kg)  BMI 25.66 kg/m2  SpO2 91% General:  GEN: nad, alert and oriented HEENT: mucous membranes moist, TM wnl, no OP exudates NECK: supple w/o LA CV: rrr.   PULM:  no inc wob but scattered exp wheeze and pseudowheeze with forces expiration. ABD: soft, +bs EXT: no edema SKIN: no acute rash

## 2010-10-15 ENCOUNTER — Other Ambulatory Visit: Payer: Self-pay | Admitting: *Deleted

## 2010-10-15 MED ORDER — LOSARTAN POTASSIUM 100 MG PO TABS: 100.0000 mg | ORAL_TABLET | Freq: Every day | ORAL | Status: DC

## 2010-10-29 NOTE — Assessment & Plan Note (Signed)
OFFICE VISIT   [name], [name]  DOB:  January 18, 1931                                       09/26/2008  CHART#:[id number]   The patient has been followed by me for the last several years for small  abdominal aortic aneurysm which has slowly enlarged.  In August of 2009  it measured 4.5 cm in maximum diameter.  She recently was evaluated by  Dr. Yancey Flemings for possible diverticulitis and a CT at that time  revealed the aneurysm had increased to 5.1 cm maximum diameter.  She  continues to have some mild left lower quadrant discomfort but that has  pretty much resolved and her bowel habits are returning towards normal.  She has had no chest pain, dyspnea on exertion, PND, orthopnea or  neurologic symptoms.  She is able to ambulate fairly well but does have  chronic COPD which is managed by Dr. Danise Mina.   PHYSICAL EXAM:  Vital signs:  Blood pressure 140/80, heart rate 76,  respirations 14.  Carotid pulses are 3+, no audible bruits.  Neurological:  Normal.  Chest:  Clear to auscultation.  Cardiovascular:  Reveals a regular rhythm, no murmurs.  Abdomen:  Soft, nontender with 5  cm pulsatile mass.  She has 3+ femoral pulses bilaterally with well-  perfused lower extremities.   I reviewed the CT scan which is with 5 mm cuts and without contrast.  I  do think she is probably a candidate for an aortic stent graft of this  aneurysm and this does need to be done.  We will schedule the following  things: 1) a Cardiolite procedure, 2) appointment with Dr. Danise Mina  for preoperative pulmonary clearance, 3) CT angiogram with 3 mm cuts and  she will return to see me in 2 weeks to further discuss this.   Quita Skye Hart Rochester, M.D.  Electronically Signed   [name]/MEDQ  D:  09/26/2008  T:  09/27/2008  Job:  [id number]   cc:   Primitivo Gauze Marina Goodell, MD  Charlcie Cradle Delford Field, MD, FCCP

## 2010-10-29 NOTE — H&P (Signed)
[name], [name]                 ACCOUNT NO.:  [id number]   MEDICAL RECORD NO.:  [id number]          PATIENT TYPE:  INP   LOCATION:  3315                         FACILITY:  [facility]   PHYSICIAN:  Quita Skye. Hart Rochester, M.D.  DATE OF BIRTH:  [birth year]   DATE OF ADMISSION:  11/01/2008  DATE OF DISCHARGE:                              HISTORY & PHYSICAL   CHIEF COMPLAINT:  Infrarenal abdominal aortic aneurysm.   HISTORY OF PRESENT ILLNESS:  This 77 year old female patient has been  followed for several years by Dr. Hart Rochester for a small infrarenal  abdominal aortic aneurysm.  She recently had some diverticulitis  reevaluated by Dr. Yancey Flemings, and a CT scan revealed increasing size of  the aneurysm of 5.1 cm.  She was evaluated and felt to be a good  candidate for aortic stent grafting and after preoperative evaluation by  Dr. Shan Levans for COPD and a preoperative negative Cardiolite exam.  She was scheduled for surgery.   PAST MEDICAL HISTORY:  1. COPD, stable.  2. Colonic polyps.  3. Diverticulosis.  4. GERD.  5. Hypertension.  6. Osteoarthritis.  7. Hyperlipidemia.  8. History of an ovarian cyst on the left.   PREVIOUS SURGERY:  1. Hysterectomy.  2. Kidney surgery for stones.  3. Appendectomy.  4. Tonsillectomy.  5. Previous esophageal dilatations.  6. Left shoulder hemiarthroplasty.  7. Right cataract surgery.  8. Left cataract surgery.   FAMILY HISTORY:  Positive for myocardial infarction in father and  mother.  Brother with pancreatic cancer and also positive for  hypertension.   SOCIAL HISTORY:  The patient is married.  She is retired, former smoker.  Does not use alcohol.   REVIEW OF SYSTEMS:  Primarily positive for her COPD where she has some  shortness of breath with activity, productive and nonproductive cough,  does have a history of heartburn with esophageal reflux disease, and  occasional headaches.   PHYSICAL EXAMINATION:  VITAL SIGNS:  Blood pressure  130/70, heart rate  80, and respirations 14.  GENERAL:  She is a well-developed, well-nourished female, in no apparent  distress, alert and oriented x3.  NECK:  Supple.  3+ carotid pulses palpable.  No bruits are audible.  NEUROLOGIC:  Normal.  No palpable adenopathy in the neck.  CHEST:  Clear to auscultation.  CARDIOVASCULAR:  Regular rhythm.  No murmurs.  ABDOMEN:  Soft and nontender with a 5-cm pulsatile mass.  She has 3+  femoral, 2+ popliteal, and 2+ posterior tibial pulse bilaterally.   IMPRESSION:  Infrarenal abdominal aortic aneurysm.   PLAN:  To admit the patient on May 19 for an elective aortic stent  grafting.  Risks and benefits have been thoroughly discussed with the  patient and she would like to proceed.      Quita Skye Hart Rochester, M.D.  Electronically Signed     [name]/MEDQ  D:  11/01/2008  T:  11/01/2008  Job:  [id number]   cc:   Wilhemina Bonito. Marina Goodell, MD  Charlcie Cradle Delford Field, MD, FCCP  Billie D. Bean, FNP

## 2010-10-29 NOTE — Assessment & Plan Note (Signed)
OFFICE VISIT   Majer, Ciera A  DOB:  1931/01/27                                       10/17/2008  NFAOZ#:[id number]   The patient returns today for further discussion regarding the  infrarenal abdominal aortic aneurysm which is approximately 5 cm in  diameter.  She has had a preoperative pulmonary evaluation by Dr. Danise Mina who feels that she is a satisfactory candidate for either stent  graft or open repair.  She also has had a preoperative Cardiolite study  performed which is negative and has an ejection fraction of 60%.   I discussed with the patient the potential options and have recommended  aortic stent grafting using a Gore Endograft.  It does appear that she  is a good candidate for this for her infrarenal aortic aneurysm.  I also  discussed the potential possibility of an open repair if technical  problems occur.  Answered her questions and she would like to proceed.  We have scheduled this for May 19 at [facility], Dr. Myra Gianotti  assisting.   Today her blood pressure is 190/90, heart rate 76, respirations 14.  Her  abdomen is soft with a prominent pulsatile mass and she has excellent  femoral pulses bilaterally.  We will proceed with surgery on May 19 as  scheduled.   Quita Skye Hart Rochester, M.D.  Electronically Signed   [name]/MEDQ  D:  10/17/2008  T:  10/18/2008  Job:  [id number]

## 2010-10-29 NOTE — Procedures (Signed)
DUPLEX ULTRASOUND OF ABDOMINAL AORTA   INDICATION:  Followup abdominal aortic aneurysm.   HISTORY:  Diabetes:  No  Cardiac:  No  Hypertension:  Yes  Smoking:  No.  Quit four years ago.  Family History:  No  Previous Surgery:  Left shoulder replacement two years ago.   DUPLEX EXAM:         AP (cm)                   TRANSVERSE (cm)  Proximal             2.02 Cm                   2.27 cm  Mid                  3.39 cm                   3.53 cm  Distal               3.62 cm                   3.64 cm  Right Iliac          0.76 cm  Left Iliac           0.88 cm   PREVIOUS:  Date: 05/19/2006 CT at [facility]  AP:  3.7  TRANSVERSE:  3.8   IMPRESSION:  Stable measurements of known abdominal aortic aneurysm.   ___________________________________________  Quita Skye Hart Rochester, M.D.   DP/MEDQ  D:  01/20/2007  T:  01/21/2007  Job:  [id number]

## 2010-10-29 NOTE — Assessment & Plan Note (Signed)
OFFICE VISIT   [name], [name]  DOB:  Jan 26, 1931                                       08/21/2009  ZOXWR#:[id number]   The patient returns today for continued followup regarding her aortic  stent graft and her infrarenal abdominal aortic aneurysm which was  repaired in May 2010 with a Gore Excluder aorto-bi-common iliac graft.  She has had no abdominal symptoms but has had some medical issues in the  last 2 months including herpes zoster (shingles) as well as the flu.  She is feeling better now but has some post-herpetic pain in her right  chest wall.  She has had no chest pain, otherwise.  No dyspnea on  exertion.  Denies any other new symptoms, but continues to have reflux  esophagitis-type symptoms as well as joint pain.   SOCIAL HISTORY:  She is married, has 2 children, is retired.  Has not  smoke cigarettes in several years and does not use alcohol.   CHRONIC MEDICAL PROBLEMS:  1. Hypertension.  2. Hyperlipidemia.  3. COPD.  4. Coronary artery disease.   PHYSICAL EXAMINATION:  Blood pressure 174/103, heart rate 100,  respirations 14.  Generally, she is alert and oriented x3, well-  developed, well-nourished female in no apparent distress.  Chest is  clear to auscultation.  Cardiovascular exam reveals a regular rhythm, no  murmurs.  Abdomen is soft, nontender, with no pulsatile mass.  Extremities:  Exam reveals 3+ femoral pulses bilaterally.  Neurologic  exam is normal.  Musculoskeletal exam reveals no joint abnormalities.   She had a CT angiogram performed today which I have reviewed and the  aneurysm sac has contracted slightly more, now measuring 4.2 x 4.1 cm in  maximum diameter, with no evidence of endoleak or migration of the  graft.  I have reassured her regarding these findings.  We will see her  back in 6 months with a duplex scan of the stent graft at that time for  further followup.     [name], M.D.  Electronically  Signed   [name]/MEDQ  D:  08/21/2009  T:  08/22/2009  Job:  [id number]

## 2010-10-29 NOTE — Procedures (Signed)
DUPLEX ULTRASOUND OF ABDOMINAL AORTA   INDICATION:  Followup of known AAA.   HISTORY:  Diabetes:  No.  Cardiac:  No.  Hypertension:  Yes.  Smoking:  Connective Tissue Disorder:  Family History:  Previous Surgery:   DUPLEX EXAM:         AP (cm)                   TRANSVERSE (cm)  Proximal             1.98 cm                   2.13 cm  Mid                  4.53 cm                   4.53 cm  Distal               3.51 cm                   3.52 cm  Right Iliac          0.95 cm                   1.01 cm  Left Iliac           1.07 cm                   1.08 cm   PREVIOUS:  Date: 01/20/07  AP:  3.39  TRANSVERSE:  3.53   IMPRESSION:  1. An increase in abdominal aortic aneurysm with maximum diameter      measuring 4.53 cm (anteroposterior) X 4.53 cm.  2. Mildly ectatic left common iliac artery.   ___________________________________________  Quita Skye Hart Rochester, M.D.   PB/MEDQ  D:  02/08/2008  T:  02/08/2008  Job:  [id number]

## 2010-10-29 NOTE — Assessment & Plan Note (Signed)
OFFICE VISIT   [name], [name]  DOB:  1930/11/17                                       02/08/2008  EAVWU#:[id number]   The patient returns for continued followup regarding her small abdominal  aortic aneurysm which was discovered by Dr. Vonita Moss a few years ago on  a CT scan.  She has had no abdominal or back symptoms and the aneurysm  has gradually increased in size.  Today's ultrasound reveals a 4.53 x  4.53 maximum dimension.  She denies any neurologic symptoms such as  hemiparesis, aphasia, amaurosis fugax, diplopia, blurred vision and has  had no chest pain, but does have chronic symptoms of COPD and is  followed by Dr. Danise Mina.  Two sons have had urgent cardiac procedures  done in the last few months and this concerns her greatly, although she  has no cardiac symptoms.  She does not take aspirin daily.  She has no  claudication symptoms.   PHYSICAL EXAM:  Blood pressure 150/82, heart rate 80, respirations 16.  Carotid pulses 3+ with no audible bruits.  Neurologic:  Normal.  Chest:  Clear to auscultation.  Cardiovascular:  Regular rhythm with no murmurs.  Abdomen is soft with a small pulsatile mass in the 4-5 cm range, which  is nontender.  She has 3+ femoral, popliteal, and 2+ posterior tibial  pulses bilaterally.   I have reassured her regarding the fact that the aneurysm has not  dramatically changed but has enlarged, and we need to continue to follow  it closely.  I will see her in 8 months with a followup CT angiogram to  see if she is a candidate for aortic stent graft for this small  aneurysm.   Quita Skye Hart [place], M.D.  Electronically Signed   [name]/MEDQ  D:  02/08/2008  T:  02/09/2008  Job:  [id number]   cc:   Arta Silence, MD  Maretta Bees. Vonita Moss, M.D.

## 2010-10-29 NOTE — Letter (Signed)
January 26, 2007   S. Kyra Manges, M.D.  [phone]  [place], [place] [address]   Re:  NARMEEN, KERPER A                 DOB:  1931/02/10   Dear August Saucer:   I saw the patient today as we discussed, and we did obtain a duplex scan  of her aneurysm last week which is unchanged from the measurements that  we had in January which came from a CT scan.  This is 3.8 x 3.7 cm in  general.  An MR scan of her back which was ordered by Dr. Kristeen Miss  estimated the size at about 4 cm which is essentially the same.  She is  not having any specific abdominal or back symptoms.  The pain which she  is experiencing is in her right inguinal area and her right buttock area  which is present frequently before ambulating, and may get slightly  worse with ambulation, but it is not vascular-type discomfort.  She is  not having true claudication symptoms, and it does not hurt in her thigh  and calf.  She has no rest pain or nonhealing ulcers by history.   PHYSICAL EXAMINATION:  Blood pressure is 200/110, heart rate is 87,  respirations are 18.  Her femoral pulses are 3+.  Popliteal is 2+.  Posterior tibial pulses are 2-3+ palpable bilaterally.  Both feet are  well perfused.  Her abdomen is soft, nontender, with very small  pulsatile mass noted.   I do not think her aneurysm needs any treatment.  I think you could  proceed with whatever treatment is necessary for the ovarian mass which  was recently discovered.  The aneurysm is not large enough to resect at  this time, and we will continue to follow it on an annual basis.   Quita Skye Hart [place], M.D.  Electronically Signed   [name]/MEDQ  D:  01/26/2007  T:  01/28/2007  Job:  [id number]   cc:   Maretta Bees. Vonita Moss, M.D.

## 2010-10-29 NOTE — Assessment & Plan Note (Signed)
OFFICE VISIT   Leibensperger, Nandini A  DOB:  [birth year]                                       12/12/2008  CHART#:[id number]   The patient returns 6 weeks post insertion of an aortobi-common iliac  Gore excluder stent graft for abdominal aortic aneurysm.  She has done  well since her surgery with no specific complaints other than some mild  low back discomfort.  She has been working in her garden picking corn  and I think she probably has strained her back slightly.  She has no  chest pain, dyspnea on exertion or other problems.   PHYSICAL EXAMINATION:  On exam today her blood pressure is 177/88, heart  rate is 82, respirations 14.  Her abdomen is soft, no pulsatile masses  palpable.  She has excellent femoral pulses bilaterally with well-  perfused lower extremities.   ABIs today are 1.0 bilaterally.  CT angiogram was reviewed by me and the  stent graft is in excellent position with no evidence of an endoleak and  contraction of the aneurysm sac to 4.7 x 4.7 cm (5.1 x 4.9  preoperatively).  I have reassured her regarding these findings and she  will return in 6 months for a CT angiogram of the stent graft unless she  has any new problems in the interim.   Quita Skye Hart [place], M.D.  Electronically Signed   [name]/MEDQ  D:  12/12/2008  T:  12/13/2008  Job:  [id number]

## 2010-10-29 NOTE — Op Note (Signed)
[name], [name]                 ACCOUNT NO.:  [id number]   MEDICAL RECORD NO.:  [id number]          PATIENT TYPE:  INP   LOCATION:  2034                         FACILITY:  [facility]   PHYSICIAN:  Juleen China IV, MDDATE OF BIRTH:  1930/11/09   DATE OF PROCEDURE:  11/01/2008  DATE OF DISCHARGE:  11/04/2008                               OPERATIVE REPORT   PREOPERATIVE DIAGNOSIS:  Abdominal aortic aneurysm.   POSTOPERATIVE DIAGNOSIS:  Abdominal aortic aneurysm.   PROCEDURE PERFORMED:  Exposure and closure of left femoral artery.   ANESTHESIA:  General.   BLOOD LOSS:  Minimal.   FINDINGS:  Complete exclusion.   INDICATIONS:  This is a 77 year old female with abdominal aortic  aneurysm.  She comes in today for repair.  This is the dictation of the  left femoral artery exposure.  Please see Dr. Candie Chroman note for full  details of the procedure.   PROCEDURE:  The patient was identified in the holding area and taken to  room 9.  She was placed supine on the table.  General endotracheal  anesthesia was administered.  The patient was prepped and draped in  standard sterile fashion.  A time-out was called.  Antibiotics were  given.  The inguinal ligament was identified by bony landmarks.  An  oblique incision was made below the inguinal ligament anterior to the  palpable pulse of the femoral artery.  Cautery was used to divide the  subcutaneous tissue.  The femoral sheath was exposed and opened sharply  with Metzenbaum scissors up to the level of the inguinal ligament.  Common femoral artery was then encircled with a vessel loop.  It was  mobilized from the inguinal ligament down to the bifurcation.  Vessel  loops were placed proximally and distally.  The artery was accessed with  an 18-gauge needle and a Benson wire was advanced into the aorta under  fluoroscopic visualization and an 8-French sheath was placed.   Following repair of the patient's aneurysm, a 12-French sheath was  removed from the left femoral artery.  The artery was flushed in  antegrade and retrograde fashion.  The arteriotomy was closed with  running 5-0 Prolene.  There was a good pulse proximal and distal to the  arteriotomy closure site.  The groin was then irrigated.  The femoral  sheath was reapproximated with 2-0 Vicryl.  Subcutaneous tissue was  closed in additional layers of 3-0 Vicryl and skin was closed with 4-0  Vicryl.  Dermabond was placed in skin.  There were no complications.      Jorge Ny, MD  Electronically Signed     VWB/MEDQ  D:  11/06/2008  T:  11/07/2008  Job:  [id number]

## 2010-10-29 NOTE — Op Note (Signed)
NAMEDEBAR, PLATE                 ACCOUNT NO.:  [id number]   MEDICAL RECORD NO.:  [id number]          PATIENT TYPE:  INP   LOCATION:  3315                         FACILITY:  [facility]   PHYSICIAN:  Quita Skye. Hart Rochester, M.D.  DATE OF BIRTH:  1930-08-18   DATE OF PROCEDURE:  11/01/2008  DATE OF DISCHARGE:                               OPERATIVE REPORT   PREOPERATIVE DIAGNOSIS:  Infrarenal abdominal aortic aneurysm.   POSTOPERATIVE DIAGNOSIS:  Infrarenal abdominal aortic aneurysm.   OPERATION:  1. Bilateral femoral artery exposure, right side Dr. Hart Rochester, left side      Dr. Durene Cal.  2. Insertion of a Gore - excluder aorto-bi-common iliac stent graft      using,      a.     A 23 x 12 x 14 primary device - right side.      b.     A 12 x 12 contralateral device - left with completion       angiography.   SURGEON:  Quita Skye. Hart Rochester, MD   FIRST ASSISTANT:  1. Durene Cal IV, MD   ANESTHESIA:  General endotracheal.   COMPLICATIONS:  None.   PROCEDURE:  The patient was taken to the operating room and placed in  the supine position at which time, satisfactory general endotracheal  anesthesia was administered.  Abdomen and groins were both were prepped  with Betadine scrub solution and draped in a routine sterile manner.  Oblique suprainguinal incisions were made bilaterally right side by Dr.  Hart Rochester and left side will be dictated by Dr. Myra Gianotti.  Common femoral  arteries were exposed down to the origin of the profunda bilaterally.  There were posterior plaques in both common femoral arteries, but the  anterior wall was soft and had excellent pulses.  The patient was then  heparinized.  A short 8-French sheath was then inserted through the  right common femoral artery after passing a Benson wire through the  introducer needle and a long 12-French sheath introduced on the left  side.  The Omni flush catheter inserted on the left side, positioned in  the suprarenal aorta and the  patient was heparinized.  The Bentson wire  was then exchanged for an Amplatz wire through a Kumpe catheter and an  18-French DrySeal Gore sheath was then positioned for introduction of  the stent graft.  A 23 mm x 12 mm x 14 cm stent graft was selected using  preoperative measurements.  It was then positioned in the suprarenal  aorta and angiogram performed through an Omni flush catheter and the  levels of the renal arteries were marked appropriately using 10 degrees  LAO and 10 degrees of cranial caudal rotation.  The graft was then  deployed just distal to the renal arteries and this was checked with a  completion angiogram with the graft in excellent position.  The gate was  then cannulated through the left common femoral sheath using the Kumpe  catheter and retrograde angiogram performed through the sheath to  measure the length of the contralateral limb.  A 12 mm x  12 cm the limb  was selected, deployed appropriately, landing it just proximal to the  origin of the left hypogastric.  Following this, the junctions were all  dilated with the Gore catheter and a completion angiogram performed,  which revealed no evidence of Endoleak and excellent position of the  graft, good flow through both hypogastrics.  The sheaths were then  removed over the guidewires.  Both femoral arteries were repaired using  continuous 6-0 Prolene.  Following antegrade and retrograde flushing,  this was completed,  clamps released.  There was excellent pulses bilaterally in the femoral  and posterior tibial arteries.  Protamine was given to reverse the  heparin.  Following adequate hemostasis, the wounds were both closed in  subcuticular fashion with Vicryl.  Sterile dressing applied.  The  patient was taken to recovery room in satisfactory condition.      Quita Skye Hart Rochester, M.D.  Electronically Signed     [name]/MEDQ  D:  11/01/2008  T:  11/02/2008  Job:  [id number]

## 2010-10-29 NOTE — Procedures (Signed)
VASCULAR LAB EXAM   INDICATION:  Followup abdominal aortic aneurysm Endograft placement  11/01/2008.   HISTORY:  Diabetes:  No.  Cardiac:  No.  Hypertension:  Yes.   EXAM:  Abdominal aortic aneurysm sac size 4.0 cm, AP 4.1 cm transverse.  Previous sac size 08/31/2009 by CT 4.2 cm, AP 4.1 cm transverse.   IMPRESSION:  The aorta and endograft appear patent.  No significant  change in size of the aneurysmal sac surrounding the Endograft.  No  evidence of endoleak was detected.   ___________________________________________  Quita Skye. Hart Rochester, M.D.   CB/MEDQ  D:  02/14/2010  T:  02/14/2010  Job:  [id number]

## 2010-11-01 NOTE — Discharge Summary (Signed)
NAME:  [name], [name]                           ACCOUNT NO.:  [id number]   MEDICAL RECORD NO.:  [id number]                   PATIENT TYPE:  INP   LOCATION:  5003                                 FACILITY:  [facility]   PHYSICIAN:  Dyke Brackett, M.D.                 DATE OF BIRTH:  1931/01/06   DATE OF ADMISSION:  01/18/2003  DATE OF DISCHARGE:  01/20/2003                                 DISCHARGE SUMMARY   ATTENDING PHYSICIAN:  Dyke Brackett, M.D.   ADMISSION DIAGNOSES:  1. 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  2. Mild chronic obstructive pulmonary disease.  3. Hypertension.  4. Gastroesophageal reflux disease.  5. History of bronchitis.  6. History of kidney stones.   DISCHARGE DIAGNOSES:  1. Status post left shoulder hemiarthroplasty.  2. Hypertension.  3. Mild chronic obstructive pulmonary disease.  4. Gastroesophageal reflux disease.  5. History of bronchitis.  6. History of kidney stones.   HISTORY OF PRESENT ILLNESS:  [name] is [name] 77 year old female with left  shoulder pain and decreased range of motion since April 2004.  She denies  any injury or past surgical intervention.  She has constant pain in her left  arm that radiates down the arm.  Mechanically symptoms include catching and  grinding.  She also describes [name] knot in the proximal humerus.  Pain does  wake her at night whenever she rolls over on the arm.  She is unable to  dress herself due to pain.  She has tried at least two cortisone injections,  the first which gave her no relief, and the second with good pain control.  Currently, she takes Vicodin for the pain, however, it has had decreasing  effect.  She had [name] MRI that showed osteonecrosis of the head, severe  degenerative changes of the glenohumeral joint.  The patient was admitted to  [facility] on January 18, 2003, and underwent [name] left shoulder  hemiarthroplasty.   ALLERGIES:  SULFA.   MEDICATIONS ON ADMISSION:  1. Nexium 40 mg one q. day.  2. Cozaar 50 mg one q. day.  3. Advair Diskus 100/50 two puffs b.i.d.  4. Trilisate 500 mg two q. day.  5. Xanax 0.5 mg one p.o. q.h.s.  6. Vicodin 500 mg one t.i.d.   SURGICAL PROCEDURE:  The patient was taken to the operating room by Dr. Margarito Liner.  Caffrey and assisted by Kriste Basque, P.[name].-C., on January 18, 2003.  The patient  was placed under general anesthesia and [name] left shoulder hemiarthroplasty was  performed.  [name] De[employer] Shoulder size 12 stem with [name] 48 mm head and plus  18 mm thickness was inserted.  The patient tolerated the procedure well, and  returned to recovery in stable condition.   CONSULTATIONS:  PT/OT and case management.   HOSPITAL COURSE:  The  patient developed hypokalemia and hypocalcemia during  her hospital stay and those were treated and resolved.  Otherwise, the  patient's hospital stay was uncomplicated.  The patient was discharged on  postoperative day #2.   LABORATORY DATA:  CBC on admission:  White blood cell count 6.2, hemoglobin  13.9, hematocrit 41.3, platelets 322.  Coag's on admission:  PT 11.8, INR  0.8, PTT was 31.  Routine chemistries on admission had [name] sodium of 130,  potassium 4.4, chloride 105, bicarb 26, glucose 92, BUN 14, creatinine 0.7.   Hepatic enzymes on admission:  AST 21, ALT 16, ALP 99, total bilirubin 0.3.   Routine EKG on admission showed normal sinus rhythm with right atrial  enlargement.  Heart rate 81 beats per minute.  PR interval 166 milliseconds.  PRT axis 77, 30, 77.   Postoperative left shoulder x-rays showed good anatomic alignment of the  left shoulder prosthesis.   DISCHARGE MEDICATIONS:  The patient is to resume home meds and add the  following:  1. Percocet 5 mg one to two tabs q.4-6h. as needed for pain.  2. Trinsicon one tablet t.i.d. for 30 days.   ACTIVITY:  Pendulum exercises as shown by physical therapy.  Range of motion  exercises of the wrist and  elbow.   DIET:  No restrictions.   CONDITION ON DISCHARGE:  The patient was in good stable condition on  discharge to home.   FOLLOWUP:  The patient needs followup with Dr. Madelon Lips in approximately 10  days from discharge.  The patient is to call office for appointment at [phone].   WOUND CARE:  Daily dressing changes.  May shower after two days of no  drainage.  The patient is to call office if temperature is greater then  101.5, chills, foul-smelling drainage, swelling, or pain not controlled by  pain medications.      Richardean Canal, Arnetha Courser, M.D.    GC/MEDQ  D:  02/07/2003  T:  02/08/2003  Job:  [id number]   cc:   Laurita Quint, M.D.  [address] [place]  [place] [address]  Fax: [phone]

## 2010-11-01 NOTE — Discharge Summary (Signed)
[name], [name]                 ACCOUNT NO.:  [id number]   MEDICAL RECORD NO.:  [id number]          PATIENT TYPE:  INP   LOCATION:  3715                         FACILITY:  [facility]   PHYSICIAN:  Rene Paci, M.D. LHCDATE OF BIRTH:  1930/10/04   DATE OF ADMISSION:  06/18/2005  DATE OF DISCHARGE:  06/21/2005                                 DISCHARGE SUMMARY   DISCHARGE DIAGNOSIS:  Acute chronic obstructive pulmonary disease  exacerbation.   HISTORY OF THE PRESENT ILLNESS:  This patient is a 77 year old female who  presents to the emergency department via ms from her primary care  physician's office.  On admission the patient reports nasal congestion as  well as increasing shortness of breath, which started on the day of  admission.   PAST MEDICAL HISTORY:  1.  COPD.  2.  Hypertension.  3.  GERD.  4.  Osteoarthritis.  5.  History of renal stones/lithotripsy.  6.  History of psoriasis.   COURSE OF THIS HOSPITALIZATION:  Problem 1  Acute COPD Exacerbation:  Chest  x-ray was performed, which noted COPD, not acute disease.  As well, a  pulmonary consult was obtained and their feeling was that it was probably  worsened by GERD.  As a result they recommended twice daily dosing of Nexium  and added Tussionex/Ultram for cough control.  The patient was placed on IV  Solu-Medrol and empiric Avelox.   The patient's respiratory status continued to improve and she was discharged  to home on June 21, 2005.   LABORATORY DATA:  Laboratories at discharge:  BUN 12 and creatinine 0.3.  Hemoglobin 14.3 and hematocrit 42.9.   DISCHARGE MEDICATIONS:  Medications at discharge consisted of:  1.  DuoNeb 4 times daily.  2.  Nexium 40 mg p.o. twice a day.  3.  Advair 250/50 twice a day.  4.  Robitussin a.c. 1-3 t every 4-6 hours for cough.  5.  Prednisone 50 mg on June 21, 2005, 40 mg on June 22, 2005, 30 mg      on June 23, 2005, 20 mg on June 24, 2005, 10 mg on June 25, 2005, and then stop.  6.  Doxycycline 100 mg p.o. twice a day for seven days.  7.  Xanax.  8.  Cozaar.  9.  Evista.  10. Lipitor as prior to admission.   FOLLOW UP:  The patient was instructed to follow up with her primary care  Oakley Kossman at [facility] in two weeks and to call Dr. Delford Field in two to four  weeks for a follow up appointment.      Melissa S. Peggyann Juba, NP      Rene Paci, M.D. [place]  Electronically Signed    MSO/MEDQ  D:  07/30/2005  T:  07/30/2005  Job:  [id number]

## 2010-11-01 NOTE — H&P (Signed)
NAME:  Amanda [name], Amanda [name]                           ACCOUNT NO.:  [id number]   MEDICAL RECORD NO.:  [id number]                   PATIENT TYPE:  INP   LOCATION:  NA                                   FACILITY:  [facility]   PHYSICIAN:  Amanda [name], M.D.                 DATE OF BIRTH:  [birth year]   DATE OF ADMISSION:  01/18/2003  DATE OF DISCHARGE:                                HISTORY & PHYSICAL   CHIEF COMPLAINT:  Shoulder pain.   HISTORY OF PRESENT ILLNESS:  Amanda [name] is [name] 77 year old female with shoulder  pain and decreasing range of motion since October 11, 2002.  She denies any  injury or previous surgery to the shoulder.  Her pain is described as [name]  stabbing constant pain.  It radiates down her shoulder and into the axillary  region of her arm.  She has mechanical symptoms of catching and grinding.  She also describes [name] knotting up feeling in her proximal humerus.  The pain  wakes her at night when she rolls over on the left arm.  She is unable to  transfer herself due to this pain.  She has tried two cortisone injections,  the first with no relief and the second with moderate to good pain control.  Currently, she takes Vicodin for the pain; however, she states that it is  decreasing in pain relief with time.  MRI shows osteonecrosis of the humeral  head and severe degenerative changes of the glenohumeral joint.  The patient  will be admitted to [facility] on January 18, 2003 to undergo left  shoulder hemiarthroplasty.  Dr. Sherene [name] has seen and evaluated the patient and  he cleared her from [name] medical standpoint for surgery with [name] minimal risk of  perioperative complications.   ALLERGIES:  SULFA.   CURRENT MEDICATIONS:  1. Nexium 40 mg one daily.  2. Cozaar 50 mg one daily.  3. Advair Diskus 100/50 two puffs b.i.d.  4. Trilisate 500 mg 2 daily.  5. Xanax 0.5 mg 1 p.o. q.h.s.  6. Viactiv 500 mg 1 t.i.d.   PAST MEDICAL HISTORY:  1. Hypertension.  2. Mild COPD.  3. GERD.  4.  History of bronchitis.  5. History of kidney stones.   PAST SURGICAL HISTORY:  1. Tonsillectomy.  2. Appendectomy.  3. Surgery for kidney stones.  4. Hysterectomy.  5. Carpal tunnel, bilateral.   She does describe having [name] hypotensive event after having surgery to remove  kidney stones approximately 10 years ago.  Denies receiving any blood  products after any of the above surgeries.   SOCIAL HISTORY:  The patient quit smoking two years ago.  Prior to that, she  had [name] 25-pack-year history.  Denies any alcohol.  She is married, has two  grown children.  Her primary care physician is Amanda [name].  His phone  number is [phone].  Lives in [name] one-story home with five steps to usual  entrance.  She is retired.   FAMILY HISTORY:  Mother deceased age 44 with an MI.  Father deceased at age  19 with an MI.  Further review of family history is positive for  hypertension, arthritis, and pancreatic cancer.   REVIEW OF SYSTEMS:  Positive for frequent colds and cough with last cold  being in April 2004.  She also has [name] history of bronchitis and kidney  stones.  States she bleeds easily and bruises easily.  Positive for  shortness of breath with exertion and orthopnea.  Denies any PND, chest  pain.  Positive for GERD.  She wears dentures on the top.  Uses glasses for  reading.  Otherwise, review of systems is negative.   PHYSICAL EXAMINATION:  GENERAL:  The patient is [name] well-developed, well-  nourished female.  Her mood and affect are appropriate and she talks easily  with the examiner.  Approximate height is 5 feet, approximate weight is 110  pounds.  VITAL SIGNS:  Temperature is 97.9, pulse 80, respiratory rate is 16, blood  pressure 140/78.  CARDIAC:  Regular rate and rhythm.  No murmurs, rubs, or gallops heard.  LUNGS:  Diminished breath sounds bilaterally.  No wheezing noted.  There is  [name] coarse rhonchi sound at the right lower base.  ABDOMEN:  Soft.  Mild tenderness periumbilical.   Bowel sounds throughout.  NECK:  Nontender with full range of motion of the cervical spine.  Trachea  is midline.  No lymphadenopathy noted.  Carotids are 2+ bilaterally without  bruits.  BACK:  Nontender to palpation over the T and L spine.  HEENT:  Normocephalic and atraumatic without frontal or maxillary sinus  tenderness to palpation.  Conjunctivae are pink.  Sclerae was nonicteric.  PERRLA.  EOM's are intact.  No visible external ear deformities.  Tug is  nontender.  TM's pearly and gray bilaterally.  Nose and nasal septum are  midline.  Nasal mucosa is pink and moist.  No polyps noted in either naris.  There is some drainage noted in the left naris.  The buccal mucosa was pink  and moist.  Pharynx without erythema or exudate.  Tongue and uvula are  midline.  BREAST/GENITOURINARY/RECTAL:  Exams all deferred at this time.  MUSCULOSKELETAL:  Upper extremities:  Full range of motion of the right  shoulder; 5/5 strength throughout the right upper extremity.  Right hand  with severe arthritic changes.  Middle trigger finger noted.  Left shoulder  forward flexion 85-90 degrees.  AB flexion is 45 degrees.  She is diffusely  tender about the shoulder girdle.  Left hand with severe arthritic changes.  Radial pulses bilaterally are 2+ and are equal and symmetric.  Lower  extremities:  Full range of motion in bilateral hips.  Bilateral knees:  Full range of motion.  No crepitance on passive range of motion noted.  Full  extension and flexion to 120-130 degrees bilaterally.  No lower leg edema  noted bilaterally.  Dorsal pedal pulses are 2+ bilaterally and equal and  symmetric.  NEUROLOGIC:  Cranial nerves II-XII grossly intact.  The patient is alert and  oriented x3.  Upper and lower strength are consistent with 5/5.  Deep tendon  reflexes in the right upper extremity are 2+.  Left biceps and brachioradial  reflex are 2+.  Lower extremity reflexes are 2+ bilaterally.   IMPRESSION: 1.  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  2. Mild chronic obstructive pulmonary disease.  3. Hypertension.  4. Gastroesophageal reflux disease.  5. History of bronchitis.  6. History of kidney stones.    PLAN:  The patient will be admitted to [facility] on January 18, 2003  and undergo [name] left shoulder hemiarthroplasty.      Richardean Canal, Arnetha Courser, M.D.    GC/MEDQ  D:  01/12/2003  T:  01/12/2003  Job:  [id number]

## 2010-11-01 NOTE — Discharge Summary (Signed)
[name], [name]                 ACCOUNT NO.:  [id number]   MEDICAL RECORD NO.:  [id number]          PATIENT TYPE:  INP   LOCATION:  2034                         FACILITY:  [facility]   PHYSICIAN:  Quita Skye. Hart Rochester, M.D.  DATE OF BIRTH:  07-09-1930   DATE OF ADMISSION:  11/01/2008  DATE OF DISCHARGE:  11/04/2008                               DISCHARGE SUMMARY   FINAL DISCHARGE DIAGNOSES:  1. Infrarenal abdominal aortic aneurysm.  2. Stable chronic obstructive pulmonary disease.  3. Colonic polyps.  4. Diverticulitis.  5. Gastroesophageal reflux disease.  6. Hypertension.  7. Osteoarthritis.  8. Hyperlipidemia.   PROCEDURES PERFORMED:  Endovascular repair of abdominal aortic aneurysm  using a Gore excluder system, main body 23 x 12 x 14, contralateral leg  on the left 12 x 12 by Dr. Hart Rochester on Nov 01, 2008.   COMPLICATIONS:  None.   CONDITION AT DISCHARGE:  Stable, improving.   DISCHARGE MEDICATIONS:  1. Albuterol sulfate 2.5 mg.  2. Ipratropium bromide 0.5 mg nebulizers 4 times daily.  3. Advair 250/50 one puff daily.  4. Cozaar 100 mg p.o. daily.  5. Nexium 40 mg p.o. daily.  6. Xanax 0.5 mg p.o. at bedtime.  7. Simvastatin 20 mg p.o. daily.  8. Garlic pill p.o. daily.  9. She is given a prescription for Ultram 50 mg 1-2 p.o. q.6 h. p.r.n.      pain, total #30 were given.   DISPOSITION:  She was discharged home in stable condition following  careful instructions regarding the care of her wounds and activity  level.  She is scheduled to see Dr. Hart Rochester in 4 weeks for continuing  followup with a CTA of the abdomen and pelvis with stent graft protocol.  The office will arrange a visit.   BRIEF IDENTIFYING STATEMENT:  For complete details, please refer the  typed history and physical.  Briefly, this 77 year old woman has been  followed for several years by Dr. Hart Rochester for an infrarenal abdominal  aortic aneurysm.  Recent CT scan demonstrated increasing size.  Dr.  Hart Rochester  evaluated her and found her to be a suitable candidate for  endovascular repair.  She was informed of the risks and benefits of the  procedure and after careful consideration elected to proceed with  surgery.   HOSPITAL COURSE:  Preoperative workup was completed as an outpatient.  She was brought in through same-day surgery on Nov 01, 2008, and  underwent the aforementioned endovascular repair of her abdominal aortic  aneurysm.  For complete details, please refer the typed operative  report.  The procedure was without complications.  She was returned to  the Post Anesthesia Care Unit and extubated.  Following stabilization,  she was transferred to a bed in the surgical step-down unit.  She was  observed overnight.  Due to her advanced age and heart rate, we elected  to keep her in the hospital.  She was transferred to a bed on a surgical  convalescent floor.  Upon transfer to the convalescent floor, she had no  recurrence of her sinus tachycardia.  Arrangements were  made for her  discharge on the second hospital day.  Prior to discharge, she was found  to be running a low-grade fever.  We obtained an urinalysis, which was  negative.  The fever was felt to be an inflammatory process of the  graft.  She was discharged home in stable condition on Nov 04, 2008.      Amanda Arms, PA      Quita Skye Hart Rochester, M.D.  Electronically Signed    KEL/MEDQ  D:  11/07/2008  T:  11/08/2008  Job:  [id number]

## 2010-11-01 NOTE — Discharge Summary (Signed)
NAME:  Amanda [name], Amanda [name]                           ACCOUNT NO.:  [id number]   MEDICAL RECORD NO.:  [id number]                   PATIENT TYPE:  INP   LOCATION:  5153                                 FACILITY:  [facility]   PHYSICIAN:  Rene Paci, M.D. [facility]          DATE OF BIRTH:  1931/01/27   DATE OF ADMISSION:  11/09/2003  DATE OF DISCHARGE:  11/14/2003                                 DISCHARGE SUMMARY   DISCHARGE DIAGNOSIS:  Acute exacerbation of chronic obstructive pulmonary  disease.   HISTORY OF PRESENT ILLNESS:  Amanda [name] is [name] 77 year old white female who  reportedly was sent to the emergency room for admission by her primary care  office.  It is not clear if she was actually seen in the office.  She was  seen one week prior and given [name] prescription for prednisone and cough  medicine for [name] presumed viral flare of her COPD.  She states over the past  24 hours, she had completed her prednisone and was feeling worse.   PAST MEDICAL HISTORY:  1. COPD.  2. Hypertension.  3. Gastroesophageal reflux disease.  4. Osteoarthritis, status post left shoulder __________.  5. Total abdominal hysterectomy.  6. History of multiple renal stones and lithotripsy.   HOSPITAL COURSE:  PULMONARY:  The patient presented with acute exacerbation  of COPD.  She was started on O2, steroids, nebulizers, and antibiotics.  She  was slowly tapered to oral medications.  She was also slowly tapered off  oxygen.  The patient has improved to [name] point she was stable for discharge.   LABORATORY DATA AT DISCHARGE:  White count from Nov 11, 2003, was 17.1,  hemoglobin 13.4.  D-dimer was less than 0.22.  BUN 5, creatinine 0.9.  LFTs  were normal.  TSH was 0.473.   DISCHARGE MEDICATIONS:  1. Guaifenesin 600 mg two tablets b.i.d. for four additional days.  2. Xanax 0.25 mg q.h.s.  3. Protonix 40 mg daily.  4. Cozaar 50 mg daily.  5. Advair 100/50 one puff b.i.d.  6. Proventil/Atrovent q.i.d. for four more days then  p.r.n.  7. Avelox 400 mg x4 more days.  8. Prednisone 20 mg daily for three days.  9. Tussionex 5 mL b.i.d. p.r.n.   FOLLOW UP:  Dr. Laurita [name] in two to three weeks.      Amanda [name], P.[name]. [facility]                  Rene Paci, M.D. [facility]    LC/MEDQ  D:  11/14/2003  T:  11/14/2003  Job:  [id number]   cc:   Amanda [name], M.D.  [address] [place]  [place] [address]  Fax: [phone]

## 2010-11-01 NOTE — Op Note (Signed)
NAME:  [name], [name]                           ACCOUNT NO.:  [id number]   MEDICAL RECORD NO.:  [id number]                   PATIENT TYPE:  INP   LOCATION:  2550                                 FACILITY:  [facility]   PHYSICIAN:  Thera Flake., M.D.             DATE OF BIRTH:  [birth year]   DATE OF PROCEDURE:  01/18/2003  DATE OF DISCHARGE:                                 OPERATIVE REPORT   PREOPERATIVE DIAGNOSIS:  Avascular necrosis of left proximal humerus with  small rotator cuff tear.   POSTOPERATIVE DIAGNOSIS:  Avascular necrosis of left proximal humerus with  small rotator cuff tear.   OPERATION:  Depuy global shoulder (size 12 stem with [name] 48 mm head and plus  18 mm thickness)   SURGEON:  Dyke Brackett, M.D.   ASSISTANT:  Madilyn Fireman, P.[name].-C.   ESTIMATED BLOOD LOSS:  Approximately 250.   DESCRIPTION OF PROCEDURE:  Sterile prep and drape in the beach chair  position with an extended deltopectoral approach made. Careful protection of  the cephalic vein retracted laterally with retraction of the conjoined  tendon medially. The superior 1/4 of the pectoralis major was incised to  allow better exposure. The subscap was excised off the tuberosity and the  capsule split and resected. There was severe head collapse secondary to  avascular necrosis. Diseased capsule was resected, the small rotator cuff  interval tear identified at the conclusion of the case repaired with #2  fiber wire. Modified Crego retractor was used to protect the cuff and the  bicipital tendon. [name] head cut was made with about 25-30 degrees of  retroversion in the anatomic plane to replace 18 mm thickness, sized again  to be 48 mm. Hand reaming up to the appropriate size broach #12. After  reaming with the straight reamers this was followed by the broaches.  Cancellous bone was taken from the head and impacted around the sides of the  metaphyseal area to maximized [name] good snug fit. The prosthesis was next  inserted in the appropriate version. Prior to this, three #2 Ethibonds were  placed as loops to allow passage of the sutures to repair the subscapularis.  The prosthesis was impacted and again prior to the final prosthesis being  put in [name] trial was carried out. Several different sizes were used relative  to [name] thicker component as well as to an off center component eccentric head.  It was thought that the anatomically correct component at 18 mm of thickness  with the above mentioned diameter reproduced the mechanics of the shoulder  nicely. There was mild tendency to be able to sublux the shoulder  posteriorly appropriately about 25 to 50%. There was no subluxation, there  was no overstuffing of the joint noted. The glenoid itself appeared to be  basically normal consistent with the diagnosis of avascular necrosis. The  subscap was repaired back after the final  components were inserted and  oversewn with #2 fiber wire. The rent in the rotator cuff was repaired as  well, the biceps tendon was intact. The deltopectoral interval was loosely  approximated with #1 Vicryl, 2-0 Vicryl in the subcutaneous tissues and the  skin with the stapling device. The patient actually had had [name] preoperative  block. She was taken to the recovery room in stable condition.                                               Thera Flake., M.D.    WDC/MEDQ  D:  01/18/2003  T:  01/18/2003  Job:  [id number]

## 2010-12-02 ENCOUNTER — Other Ambulatory Visit: Payer: Self-pay | Admitting: *Deleted

## 2010-12-02 MED ORDER — ALPRAZOLAM 0.5 MG PO TABS: 0.5000 mg | ORAL_TABLET | Freq: Every evening | ORAL | Status: DC | PRN

## 2010-12-04 NOTE — Telephone Encounter (Signed)
Rx called to [facility]. 

## 2010-12-23 ENCOUNTER — Ambulatory Visit (INDEPENDENT_AMBULATORY_CARE_PROVIDER_SITE_OTHER): Payer: Medicare HMO | Admitting: Family Medicine

## 2010-12-23 ENCOUNTER — Other Ambulatory Visit: Payer: Self-pay | Admitting: Family Medicine

## 2010-12-23 ENCOUNTER — Encounter: Payer: Self-pay | Admitting: Family Medicine

## 2010-12-23 VITALS — BP 130/82 | HR 94 | Temp 98.2°F | Wt 127.5 lb

## 2010-12-23 DIAGNOSIS — J449 Chronic obstructive pulmonary disease, unspecified: Secondary | ICD-10-CM

## 2010-12-23 MED ORDER — HYDROCODONE-ACETAMINOPHEN 5-500 MG PO TABS: 1.0000 | ORAL_TABLET | Freq: Four times a day (QID) | ORAL | Status: DC | PRN

## 2010-12-23 MED ORDER — AZITHROMYCIN 250 MG PO TABS: ORAL_TABLET | ORAL | Status: AC

## 2010-12-23 MED ORDER — PREDNISONE 10 MG PO TABS: ORAL_TABLET | ORAL | Status: DC

## 2010-12-23 NOTE — Progress Notes (Signed)
77 yo with h/o COPD with inc in cough and change/inc in sputum over past 3 days.   ST and feeling of irritation in throat.  No fevers but chills last night.  No diarrhea, + rhinorrhea this AM.  Wheezing more and coughing more at night (and during the day).  Some relief of cough with SABA.  Using SABA more than normal.  Subjective fever past few days.  Tolerates zmax.  Prev best relief of cough with tussionex.  She insists that she only gets better with course of prednisone, very aware of risk of continued prednisone use.  Patient Active Problem List  Diagnoses  . SHINGLES  . ADRENAL MASS  . HYPERLIPIDEMIA  . GLAUCOMA  . CERUMEN IMPACTION, LEFT  . HYPERTENSION  . ABDOMINAL AORTIC ANEURYSM  . HEMORRHOIDS, INTERNAL  . ACUTE FRONTAL SINUSITIS  . ACUTE BRONCHITIS  . COPD  . GASTRITIS  . DUODENITIS, WITHOUT HEMORRHAGE  . HIATAL HERNIA  . PSORIASIS  . SEBORRHEIC KERATOSIS  . OSTEOARTHRITIS  . HIP PAIN, RIGHT  . BACK PAIN, LUMBAR  . OSTEOPOROSIS  . SCOLIOSIS  . FATIGUE  . RIB PAIN, LEFT SIDED  . DYSPHAGIA UNSPECIFIED  . DYSPHAGIA  . CHANGE IN BOWELS  . GROIN PAIN   Past Medical History  Diagnosis Date  . History of colonic polyps   . COPD (chronic obstructive pulmonary disease)   . Diverticulitis of colon   . GERD (gastroesophageal reflux disease)   . HTN (hypertension)   . Osteoarthritis   . Hyperlipemia   . Aortic aneurysm, abdominal   . Ovarian cyst, left    Past Surgical History  Procedure Date  . Kidney stone surgery   . Appendectomy   . Tonsillectomy   . Hemiarthroplasty shoulder fracture   . Cataract extraction, bilateral   . Abdominal aortic aneurysm repair    History  Substance Use Topics  . Smoking status: Former Games developer  . Smokeless tobacco: Not on file  . Alcohol Use: Not on file   Family History  Problem Relation Age of Onset  . Heart attack Mother   . Heart attack Father   . Deep vein thrombosis Father   . Cancer Brother     Pancreas and colon     Allergies  Allergen Reactions  . Clarithromycin     REACTION: abd pain  . Cleartuss Dh     REACTION: feels funny  . Iohexol      Desc: pt reports dyspnea and throat swelling about age 32 w/ IV contrast reaction when kidneys were being checked; 07/25/08 [facility]   . Moxifloxacin     REACTION: hallucinations  . Sulfonamide Derivatives     REACTION: unknown   Current Outpatient Prescriptions on File Prior to Visit  Medication Sig Dispense Refill  . albuterol (PROVENTIL) (2.5 MG/3ML) 0.083% nebulizer solution Take 2.5 mg by nebulization every 6 (six) hours as needed.        . ALPRAZolam (XANAX) 0.5 MG tablet Take 1 tablet (0.5 mg total) by mouth at bedtime as needed.  90 tablet  0  . atorvastatin (LIPITOR) 10 MG tablet Take 10 mg by mouth daily.        [name] esomeprazole (NEXIUM) 40 MG capsule Take 40 mg by mouth daily before breakfast.        . Fluticasone-Salmeterol (ADVAIR DISKUS) 250-50 MCG/DOSE AEPB Inhale 1 puff into the lungs daily.        . Garlic (GARLIQUE) 400 MG TBEC Take 1 tablet by mouth daily.        [name]  halobetasol (ULTRAVATE) 0.05 % cream Apply 1 application topically as needed.        [name] HYDROcodone-acetaminophen (VICODIN) 5-500 MG per tablet Take 1 tablet by mouth every 6 (six) hours as needed. For cough  90 tablet  0  . ipratropium-albuterol (DUONEB) 0.5-2.5 (3) MG/3ML SOLN Take 3 mLs by nebulization as directed.        [name] losartan (COZAAR) 100 MG tablet Take 1 tablet (100 mg total) by mouth daily.  30 tablet  6  . meclizine (ANTIVERT) 25 MG tablet Take 25 mg by mouth every 6 (six) hours as needed.        [name] oxyCODONE-acetaminophen (PERCOCET) 5-325 MG per tablet Take 1 tablet by mouth every 6 (six) hours as needed.        . predniSONE (DELTASONE) 10 MG tablet 5 tabs po x 3 days, 4 tabs po x 3 days, 3 tabs po x 3 days, 2 tabs po x 2 days, 1 tab po x 2 days.  Stop Dispense qs  1 tablet  0  . rosuvastatin (CRESTOR) 10 MG tablet Take 10 mg by mouth daily.        . traMADol (ULTRAM) 50  MG tablet Take 50 mg by mouth 2 (two) times daily.         The PMH, PSH, Social History, Family History, Medications, and allergies have been reviewed in [facility], and have been updated if relevant.   Review of Systems       See HPI General:  Complains of chills; denies fever. CV:  Denies chest pain or discomfort. Resp:  Complains of cough, shortness of breath, sputum productive, and wheezing.  Physical Exam BP 130/82  Pulse 94  Temp(Src) 98.2 F (36.8 C) (Oral)  Wt 127 lb 8 oz (57.834 kg)  SpO2 95%  General:  GEN: nad, alert and oriented HEENT: mucous membranes moist, TM wnl, no OP exudates NECK: supple w/o LA CV: rrr.   PULM:  no inc wob but scattered exp wheeze and pseudowheeze with forces expiration. ABD: soft, +bs EXT: no edema SKIN: no acute rash   Assessment and Plan: 1. COPD   Deteriorated but large part of symptoms are related to anxiety as well. She is understandably anxious about having the sensation that she cannot breath. She is aware of risk of recurrent prednisone use but feels it is the only that helps. She does have true COPD with true wheezes on exam. Will order another course of prednisone and Zpack given fever. Advised to continue using inhalers as well.

## 2010-12-25 ENCOUNTER — Telehealth: Payer: Self-pay | Admitting: *Deleted

## 2010-12-25 MED ORDER — BENZONATATE 100 MG PO CAPS: 100.0000 mg | ORAL_CAPSULE | Freq: Four times a day (QID) | ORAL | Status: DC | PRN

## 2010-12-25 MED ORDER — HYDROCODONE-ACETAMINOPHEN 5-500 MG PO TABS: 1.0000 | ORAL_TABLET | Freq: Four times a day (QID) | ORAL | Status: AC | PRN

## 2010-12-25 NOTE — Telephone Encounter (Signed)
Spoke with pharmacist and the Vicodin Rx was filled on 12/23/2010 but the patient has not picked it up.  Patient advised as instructed via telephone, she will pick up both Rx's today.

## 2010-12-25 NOTE — Telephone Encounter (Signed)
She is already taking Vicodin so I would feel more comfortable with Tessalon. I will send [facility].

## 2010-12-25 NOTE — Telephone Encounter (Signed)
Patient says that her cough isn't any better, and nothing seems to be helping. She is asking if she can get hydrocodone cough syrup called in, she has used in the past and it really helped. [facility]ses [facility].

## 2010-12-25 NOTE — Telephone Encounter (Signed)
Ok to refill her vicodin if it was not refilled on 12/23/2010.  It looks like we just refilled it.  Please check with pharmacy before refilling.

## 2010-12-25 NOTE — Telephone Encounter (Signed)
Patient advised as instructed via telephone.  She stated that she does not haven any Vicodin but would like a refill because she is hurting so bad from coughing.  She will take the Tessalon also but would like the Vicodin to be refilled.

## 2011-02-05 ENCOUNTER — Encounter: Payer: Self-pay | Admitting: Family Medicine

## 2011-02-05 ENCOUNTER — Other Ambulatory Visit: Payer: Self-pay | Admitting: *Deleted

## 2011-02-05 ENCOUNTER — Ambulatory Visit (INDEPENDENT_AMBULATORY_CARE_PROVIDER_SITE_OTHER): Payer: Medicare HMO | Admitting: Family Medicine

## 2011-02-05 VITALS — BP 140/90 | HR 72 | Temp 98.6°F | Wt 130.0 lb

## 2011-02-05 DIAGNOSIS — J449 Chronic obstructive pulmonary disease, unspecified: Secondary | ICD-10-CM

## 2011-02-05 MED ORDER — PREDNISONE 10 MG PO TABS: ORAL_TABLET | ORAL | Status: DC

## 2011-02-05 MED ORDER — AZITHROMYCIN 250 MG PO TABS: ORAL_TABLET | ORAL | Status: AC

## 2011-02-05 MED ORDER — IPRATROPIUM-ALBUTEROL 0.5-2.5 (3) MG/3ML IN SOLN: 3.0000 mL | RESPIRATORY_TRACT | Status: DC

## 2011-02-05 MED ORDER — HYDROCODONE-ACETAMINOPHEN 5-500 MG PO TABS: 1.0000 | ORAL_TABLET | Freq: Four times a day (QID) | ORAL | Status: DC | PRN

## 2011-02-05 NOTE — Telephone Encounter (Signed)
Rx for Duoneb faxed to [facility] pharmacy at [phone], patients [facility] ID number was included on the refill, [id number].

## 2011-02-05 NOTE — Progress Notes (Signed)
77 yo with h/o COPD with inc in cough and change/inc in sputum over past 7 days.   ST and feeling of irritation in throat.  No fevers but chills last night.  No diarrhea, + rhinorrhea this AM.  Wheezing more and coughing more at night (and during the day).    Some relief of cough with SABA.  Using SABA more than normal.  Subjective chills last night.  Tolerates zmax.  Prev best relief of cough with tussionex.  She insists that she only gets better with course of prednisone, very aware of risk of continued prednisone use.  Patient Active Problem List  Diagnoses  . SHINGLES  . ADRENAL MASS  . HYPERLIPIDEMIA  . GLAUCOMA  . CERUMEN IMPACTION, LEFT  . HYPERTENSION  . ABDOMINAL AORTIC ANEURYSM  . HEMORRHOIDS, INTERNAL  . ACUTE FRONTAL SINUSITIS  . ACUTE BRONCHITIS  . COPD  . GASTRITIS  . DUODENITIS, WITHOUT HEMORRHAGE  . HIATAL HERNIA  . PSORIASIS  . SEBORRHEIC KERATOSIS  . OSTEOARTHRITIS  . HIP PAIN, RIGHT  . BACK PAIN, LUMBAR  . OSTEOPOROSIS  . SCOLIOSIS  . FATIGUE  . RIB PAIN, LEFT SIDED  . DYSPHAGIA UNSPECIFIED  . DYSPHAGIA  . CHANGE IN BOWELS  . GROIN PAIN   Past Medical History  Diagnosis Date  . History of colonic polyps   . COPD (chronic obstructive pulmonary disease)   . Diverticulitis of colon   . GERD (gastroesophageal reflux disease)   . HTN (hypertension)   . Osteoarthritis   . Hyperlipemia   . Aortic aneurysm, abdominal   . Ovarian cyst, left    Past Surgical History  Procedure Date  . Kidney stone surgery   . Appendectomy   . Tonsillectomy   . Hemiarthroplasty shoulder fracture   . Cataract extraction, bilateral   . Abdominal aortic aneurysm repair    History  Substance Use Topics  . Smoking status: Former Games developer  . Smokeless tobacco: Not on file  . Alcohol Use: Not on file   Family History  Problem Relation Age of Onset  . Heart attack Mother   . Heart attack Father   . Deep vein thrombosis Father   . Cancer Brother     Pancreas and colon    Allergies  Allergen Reactions  . Clarithromycin     REACTION: abd pain  . Cleartuss Dh     REACTION: feels funny  . Iohexol      Desc: pt reports dyspnea and throat swelling about age 89 w/ IV contrast reaction when kidneys were being checked; 07/25/08 SLG   . Moxifloxacin     REACTION: hallucinations  . Sulfonamide Derivatives     REACTION: unknown   Current Outpatient Prescriptions on File Prior to Visit  Medication Sig Dispense Refill  . albuterol (PROVENTIL) (2.5 MG/3ML) 0.083% nebulizer solution Take 2.5 mg by nebulization every 6 (six) hours as needed.        . ALPRAZolam (XANAX) 0.5 MG tablet Take 1 tablet (0.5 mg total) by mouth at bedtime as needed.  90 tablet  0  . atorvastatin (LIPITOR) 10 MG tablet Take 10 mg by mouth daily.        . benzonatate (TESSALON PERLES) 100 MG capsule Take 1 capsule (100 mg total) by mouth every 6 (six) hours as needed for cough.  30 capsule  0  . esomeprazole (NEXIUM) 40 MG capsule Take 40 mg by mouth daily before breakfast.        . Fluticasone-Salmeterol (ADVAIR DISKUS)  250-50 MCG/DOSE AEPB Inhale 1 puff into the lungs daily.        . Garlic (GARLIQUE) 400 MG TBEC Take 1 tablet by mouth daily.        . halobetasol (ULTRAVATE) 0.05 % cream Apply 1 application topically as needed.        [name] ipratropium-albuterol (DUONEB) 0.5-2.5 (3) MG/3ML SOLN Take 3 mLs by nebulization as directed.        [name] losartan (COZAAR) 100 MG tablet Take 1 tablet (100 mg total) by mouth daily.  30 tablet  6  . meclizine (ANTIVERT) 25 MG tablet Take 25 mg by mouth every 6 (six) hours as needed.        . predniSONE (DELTASONE) 10 MG tablet 5 tabs po x 3 days, 4 tabs po x 3 days, 3 tabs po x 3 days, 2 tabs po x 2 days, 1 tab po x 2 days.  Stop Dispense qs  1 tablet  0  . rosuvastatin (CRESTOR) 10 MG tablet Take 10 mg by mouth daily.        . traMADol (ULTRAM) 50 MG tablet Take 50 mg by mouth 2 (two) times daily.         The PMH, PSH, Social History, Family History,  Medications, and allergies have been reviewed in [facility], and have been updated if relevant.   Review of Systems       See HPI General:  Complains of chills; denies fever. CV:  Denies chest pain or discomfort. Resp:  Complains of cough, shortness of breath, sputum productive, and wheezing.  Physical Exam BP 140/90  Pulse 72  Temp(Src) 98.6 F (37 C) (Oral)  Wt 130 lb (58.968 kg)  SpO2 93%  General:  GEN: nad, alert and oriented HEENT: mucous membranes moist, TM wnl, no OP exudates NECK: supple w/o LA CV: rrr.   PULM:  no inc wob but scattered exp wheeze and pseudowheeze with forces expiration. ABD: soft, +bs EXT: no edema SKIN: no acute rash   Assessment and Plan:  1. COPD    Deteriorated but large part of symptoms are related to anxiety as well.  She is aware of risk of recurrent prednisone use but feels it is the only that helps. She does have true COPD with true wheezes on exam. Will order another course of prednisone and Zpack given fever. Advised to continue using inhalers as well. Refilled her nebs as well

## 2011-02-18 ENCOUNTER — Encounter (INDEPENDENT_AMBULATORY_CARE_PROVIDER_SITE_OTHER): Payer: Medicare HMO

## 2011-02-18 DIAGNOSIS — I714 Abdominal aortic aneurysm, without rupture: Secondary | ICD-10-CM

## 2011-02-18 DIAGNOSIS — I739 Peripheral vascular disease, unspecified: Secondary | ICD-10-CM

## 2011-02-18 DIAGNOSIS — Z48812 Encounter for surgical aftercare following surgery on the circulatory system: Secondary | ICD-10-CM

## 2011-02-24 ENCOUNTER — Other Ambulatory Visit: Payer: Self-pay | Admitting: *Deleted

## 2011-02-24 MED ORDER — ALPRAZOLAM 0.5 MG PO TABS: 0.5000 mg | ORAL_TABLET | Freq: Every evening | ORAL | Status: DC | PRN

## 2011-02-25 ENCOUNTER — Encounter: Payer: Self-pay | Admitting: Vascular Surgery

## 2011-02-25 NOTE — Telephone Encounter (Signed)
[facility]. 

## 2011-02-25 NOTE — Procedures (Unsigned)
VASCULAR LAB EXAM  INDICATION:  Followup AAA endograft placed 11/01/2008.  HISTORY: Diabetes:  No. Cardiac:  No. Hypertension:  Yes.  EXAM:  AAA sac size 3.77 cm AP, 3.81 cm transverse. Previous sac size 02/13/2010 4.0 cm AP, 4.1 cm transverse.  IMPRESSION: 1. The aorta and endograft appear patent. 2. No significant change in size of the aneurysmal sac surrounding the     endograft. 3. No evidence of endoleak was detected. 4. Limited visualization of the mid to distal aorta due to overlying     bowel gas.  ___________________________________________ Quita Skye. Hart Rochester, M.D.  EM/MEDQ  D:  02/18/2011  T:  02/18/2011  Job:  [id number]

## 2011-03-21 ENCOUNTER — Ambulatory Visit: Payer: Medicare HMO | Admitting: Family Medicine

## 2011-03-21 ENCOUNTER — Encounter: Payer: Self-pay | Admitting: Family Medicine

## 2011-03-21 ENCOUNTER — Ambulatory Visit (INDEPENDENT_AMBULATORY_CARE_PROVIDER_SITE_OTHER): Payer: Medicare HMO | Admitting: Family Medicine

## 2011-03-21 ENCOUNTER — Other Ambulatory Visit: Payer: Self-pay | Admitting: Vascular Surgery

## 2011-03-21 DIAGNOSIS — S81819A Laceration without foreign body, unspecified lower leg, initial encounter: Secondary | ICD-10-CM

## 2011-03-21 DIAGNOSIS — Z48812 Encounter for surgical aftercare following surgery on the circulatory system: Secondary | ICD-10-CM

## 2011-03-21 DIAGNOSIS — S81809A Unspecified open wound, unspecified lower leg, initial encounter: Secondary | ICD-10-CM

## 2011-03-21 DIAGNOSIS — I714 Abdominal aortic aneurysm, without rupture: Secondary | ICD-10-CM

## 2011-03-21 NOTE — Assessment & Plan Note (Signed)
No bleeding currently.. To help with bleeding elevate and can apply ice. No clear infection. Not area to debride or suture.  Continue neosporin and dry gauze.  Follow up if not improving as expected in next week. Keep eye out for redness spreading, fever etc....call.

## 2011-03-21 NOTE — Patient Instructions (Signed)
To help with bleeding elevate and can apply ice. Continue neosporin and dry gauze. Follow up if not improving as expected in next week. Keep eye out for redness spreading, fever etc....call.

## 2011-03-21 NOTE — Progress Notes (Addendum)
  Subjective:    Patient ID: [name], female    DOB: 12-05-1930, 77 y.o.   MRN: [id number]  HPI   77 year old female with HTN  presnets with a laceration on her right lower leg.  Occurred yesterday... Vacuum fell out of hand, rubbed done front of leg.  Has bruising over leg and area gouged out by the plug of vacuum. Mildly painful, stinging.  Applying neosporin and dry bandage.    No fever. No redness spreading around site.   Review of Systems  Constitutional: Negative for fever and fatigue.  Respiratory: Negative for shortness of breath.   Cardiovascular: Negative for chest pain.       Objective:   Physical Exam  Constitutional: She appears well-developed and well-nourished.  Cardiovascular: Normal rate and regular rhythm.   Pulmonary/Chest: Effort normal and breath sounds normal.  Skin: Skin is warm and dry. Laceration noted.       Laceration with inferior flap on right anteriot lower leg... 3 cm length, flap covering 2/3s of lesion Contusion upward on shin  Also old scarring and psoriasis changes on leg           Assessment & Plan:  Up to date with Td .[name] Last given in 2009.

## 2011-03-24 ENCOUNTER — Ambulatory Visit: Payer: Medicare HMO | Admitting: Family Medicine

## 2011-03-26 ENCOUNTER — Ambulatory Visit (INDEPENDENT_AMBULATORY_CARE_PROVIDER_SITE_OTHER): Payer: Medicare HMO

## 2011-03-26 DIAGNOSIS — Z23 Encounter for immunization: Secondary | ICD-10-CM

## 2011-04-30 ENCOUNTER — Encounter: Payer: Self-pay | Admitting: Family Medicine

## 2011-04-30 ENCOUNTER — Telehealth: Payer: Self-pay | Admitting: *Deleted

## 2011-04-30 ENCOUNTER — Ambulatory Visit (INDEPENDENT_AMBULATORY_CARE_PROVIDER_SITE_OTHER): Payer: Medicare HMO | Admitting: Family Medicine

## 2011-04-30 VITALS — BP 130/90 | HR 74 | Temp 98.7°F | Ht 60.0 in | Wt 132.5 lb

## 2011-04-30 DIAGNOSIS — J449 Chronic obstructive pulmonary disease, unspecified: Secondary | ICD-10-CM

## 2011-04-30 MED ORDER — PREDNISONE 10 MG PO TABS: ORAL_TABLET | ORAL | Status: DC

## 2011-04-30 MED ORDER — AZITHROMYCIN 250 MG PO TABS: ORAL_TABLET | ORAL | Status: DC

## 2011-04-30 MED ORDER — HYDROCODONE-ACETAMINOPHEN 5-500 MG PO TABS: 1.0000 | ORAL_TABLET | Freq: Four times a day (QID) | ORAL | Status: DC | PRN

## 2011-04-30 NOTE — Telephone Encounter (Signed)
Pred[facility], it didn't get sent in with pt's other meds this morning.

## 2011-04-30 NOTE — Progress Notes (Signed)
77  yo with h/o COPD well known to me here with inc in cough and change/inc in sputum over past 7 days.      No fevers but chills last night.      Wheezing more and coughing more at night (and during the day).    Some relief of cough with SABA.  Using SABA more than normal.  Subjective chills last night.  Tolerates zmax.    She insists that she only gets better with course of prednisone, very aware of risk of continued prednisone use.  Patient Active Problem List  Diagnoses  . SHINGLES  . ADRENAL MASS  . HYPERLIPIDEMIA  . GLAUCOMA  . CERUMEN IMPACTION, LEFT  . HYPERTENSION  . ABDOMINAL AORTIC ANEURYSM  . HEMORRHOIDS, INTERNAL  . ACUTE FRONTAL SINUSITIS  . ACUTE BRONCHITIS  . COPD  . GASTRITIS  . DUODENITIS, WITHOUT HEMORRHAGE  . HIATAL HERNIA  . PSORIASIS  . SEBORRHEIC KERATOSIS  . OSTEOARTHRITIS  . HIP PAIN, RIGHT  . BACK PAIN, LUMBAR  . OSTEOPOROSIS  . SCOLIOSIS  . FATIGUE  . RIB PAIN, LEFT SIDED  . DYSPHAGIA UNSPECIFIED  . DYSPHAGIA  . CHANGE IN BOWELS  . GROIN PAIN  . Laceration of lower leg   Past Medical History  Diagnosis Date  . History of colonic polyps   . COPD (chronic obstructive pulmonary disease)   . Diverticulitis of colon   . GERD (gastroesophageal reflux disease)   . HTN (hypertension)   . Osteoarthritis   . Hyperlipemia   . Aortic aneurysm, abdominal   . Ovarian cyst, left    Past Surgical History  Procedure Date  . Kidney stone surgery   . Appendectomy   . Tonsillectomy   . Hemiarthroplasty shoulder fracture   . Cataract extraction, bilateral   . Abdominal aortic aneurysm repair    History  Substance Use Topics  . Smoking status: Former Games developer  . Smokeless tobacco: Not on file  . Alcohol Use: Not on file   Family History  Problem Relation Age of Onset  . Heart attack Mother   . Heart attack Father   . Deep vein thrombosis Father   . Cancer Brother     Pancreas and colon   Allergies  Allergen Reactions  . Clarithromycin      REACTION: abd pain  . Cleartuss Dh     REACTION: feels funny  . Iohexol      Desc: pt reports dyspnea and throat swelling about age 8 w/ IV contrast reaction when kidneys were being checked; 07/25/08 SLG   . Moxifloxacin     REACTION: hallucinations  . Sulfonamide Derivatives     REACTION: unknown   Current Outpatient Prescriptions on File Prior to Visit  Medication Sig Dispense Refill  . albuterol (PROVENTIL) (2.5 MG/3ML) 0.083% nebulizer solution Take 2.5 mg by nebulization every 6 (six) hours as needed.        . ALPRAZolam (XANAX) 0.5 MG tablet Take 1 tablet (0.5 mg total) by mouth at bedtime as needed.  90 tablet  0  . atorvastatin (LIPITOR) 10 MG tablet Take 10 mg by mouth daily.        . benzonatate (TESSALON PERLES) 100 MG capsule Take 1 capsule (100 mg total) by mouth every 6 (six) hours as needed for cough.  30 capsule  0  . esomeprazole (NEXIUM) 40 MG capsule Take 40 mg by mouth daily before breakfast.        . Fluticasone-Salmeterol (ADVAIR DISKUS) 250-50 MCG/DOSE  AEPB Inhale 1 puff into the lungs daily.        . Garlic (GARLIQUE) 400 MG TBEC Take 1 tablet by mouth daily.        . halobetasol (ULTRAVATE) 0.05 % cream Apply 1 application topically as needed.        [name] ipratropium-albuterol (DUONEB) 0.5-2.5 (3) MG/3ML SOLN Take 3 mLs by nebulization as directed.  360 mL  3  . losartan (COZAAR) 100 MG tablet Take 1 tablet (100 mg total) by mouth daily.  30 tablet  6  . meclizine (ANTIVERT) 25 MG tablet Take 25 mg by mouth every 6 (six) hours as needed.        . predniSONE (DELTASONE) 10 MG tablet 5 tabs po x 3 days, 4 tabs po x 3 days, 3 tabs po x 3 days, 2 tabs po x 2 days, 1 tab po x 2 days.  Stop Dispense qs  1 tablet  0  . rosuvastatin (CRESTOR) 10 MG tablet Take 10 mg by mouth daily.        . traMADol (ULTRAM) 50 MG tablet Take 50 mg by mouth 2 (two) times daily.         The PMH, PSH, Social History, Family History, Medications, and allergies have been reviewed in [facility],  and have been updated if relevant.   Review of Systems       See HPI General:  Complains of chills; denies fever. CV:  Denies chest pain or discomfort. Resp:  Complains of cough, shortness of breath, sputum productive, and wheezing.  Physical Exam BP 130/90  Pulse 74  Temp(Src) 98.7 F (37.1 C) (Oral)  Ht 5' (1.524 m)  Wt 132 lb 8 oz (60.102 kg)  BMI 25.88 kg/m2  SpO2 90%  General:  GEN: nad, alert and oriented HEENT: mucous membranes moist, TM wnl, no OP exudates NECK: supple w/o LA CV: rrr.   PULM:  no inc wob but scattered exp wheeze and pseudowheeze with forces expiration. ABD: soft, +bs EXT: no edema SKIN: no acute rash   Assessment and Plan:  1. COPD    Deteriorated but large part of symptoms are related to anxiety as well. She is aware of risk of recurrent prednisone use but feels it is the only that helps. She does have true COPD with true wheezes on exam. Will order another course of prednisone and Zpack given fever. Advised to continue using inhalers as well.

## 2011-05-12 ENCOUNTER — Other Ambulatory Visit: Payer: Self-pay | Admitting: Family Medicine

## 2011-05-13 MED ORDER — LOSARTAN POTASSIUM 100 MG PO TABS: 100.0000 mg | ORAL_TABLET | Freq: Every day | ORAL | Status: DC

## 2011-05-13 NOTE — Telephone Encounter (Signed)
Addended by: Gilmer Mor on: 05/13/2011 11:37 AM   Modules accepted: Orders

## 2011-05-19 ENCOUNTER — Other Ambulatory Visit: Payer: Self-pay | Admitting: Internal Medicine

## 2011-05-20 NOTE — Progress Notes (Signed)
Approve

## 2011-05-20 NOTE — Progress Notes (Signed)
[facility] 

## 2011-05-22 ENCOUNTER — Other Ambulatory Visit: Payer: Self-pay | Admitting: Family Medicine

## 2011-05-22 NOTE — Progress Notes (Signed)
complete

## 2011-05-23 ENCOUNTER — Other Ambulatory Visit: Payer: Self-pay | Admitting: *Deleted

## 2011-05-23 MED ORDER — ESOMEPRAZOLE MAGNESIUM 40 MG PO CPDR: 40.0000 mg | DELAYED_RELEASE_CAPSULE | Freq: Every day | ORAL | Status: DC

## 2011-06-03 ENCOUNTER — Other Ambulatory Visit: Payer: Self-pay | Admitting: Internal Medicine

## 2011-06-03 MED ORDER — IPRATROPIUM-ALBUTEROL 0.5-2.5 (3) MG/3ML IN SOLN: 3.0000 mL | RESPIRATORY_TRACT | Status: DC

## 2011-07-18 ENCOUNTER — Encounter (HOSPITAL_COMMUNITY): Payer: Self-pay | Admitting: Internal Medicine

## 2011-07-18 ENCOUNTER — Emergency Department (HOSPITAL_COMMUNITY): Payer: Medicare HMO

## 2011-07-18 ENCOUNTER — Inpatient Hospital Stay (HOSPITAL_COMMUNITY)
Admission: EM | Admit: 2011-07-18 | Discharge: 2011-07-22 | DRG: 189 | Disposition: A | Discharge status: Home / Self Care | Payer: Medicare HMO | Attending: Internal Medicine | Admitting: Internal Medicine

## 2011-07-18 ENCOUNTER — Other Ambulatory Visit: Payer: Self-pay

## 2011-07-18 ENCOUNTER — Ambulatory Visit: Payer: Medicare HMO | Admitting: Family Medicine

## 2011-07-18 DIAGNOSIS — S81819A Laceration without foreign body, unspecified lower leg, initial encounter: Secondary | ICD-10-CM

## 2011-07-18 DIAGNOSIS — E278 Other specified disorders of adrenal gland: Secondary | ICD-10-CM

## 2011-07-18 DIAGNOSIS — M810 Age-related osteoporosis without current pathological fracture: Secondary | ICD-10-CM

## 2011-07-18 DIAGNOSIS — K2980 Duodenitis without bleeding: Secondary | ICD-10-CM

## 2011-07-18 DIAGNOSIS — R198 Other specified symptoms and signs involving the digestive system and abdomen: Secondary | ICD-10-CM

## 2011-07-18 DIAGNOSIS — M25559 Pain in unspecified hip: Secondary | ICD-10-CM

## 2011-07-18 DIAGNOSIS — J449 Chronic obstructive pulmonary disease, unspecified: Secondary | ICD-10-CM | POA: Diagnosis present

## 2011-07-18 DIAGNOSIS — H409 Unspecified glaucoma: Secondary | ICD-10-CM

## 2011-07-18 DIAGNOSIS — R109 Unspecified abdominal pain: Secondary | ICD-10-CM

## 2011-07-18 DIAGNOSIS — L821 Other seborrheic keratosis: Secondary | ICD-10-CM

## 2011-07-18 DIAGNOSIS — L408 Other psoriasis: Secondary | ICD-10-CM

## 2011-07-18 DIAGNOSIS — R1310 Dysphagia, unspecified: Secondary | ICD-10-CM

## 2011-07-18 DIAGNOSIS — J9620 Acute and chronic respiratory failure, unspecified whether with hypoxia or hypercapnia: Secondary | ICD-10-CM

## 2011-07-18 DIAGNOSIS — M545 Low back pain: Secondary | ICD-10-CM

## 2011-07-18 DIAGNOSIS — I714 Abdominal aortic aneurysm, without rupture: Secondary | ICD-10-CM

## 2011-07-18 DIAGNOSIS — E785 Hyperlipidemia, unspecified: Secondary | ICD-10-CM

## 2011-07-18 DIAGNOSIS — J0110 Acute frontal sinusitis, unspecified: Secondary | ICD-10-CM

## 2011-07-18 DIAGNOSIS — M1990 Unspecified osteoarthritis, unspecified site: Secondary | ICD-10-CM

## 2011-07-18 DIAGNOSIS — R5383 Other fatigue: Secondary | ICD-10-CM

## 2011-07-18 DIAGNOSIS — H6120 Impacted cerumen, unspecified ear: Secondary | ICD-10-CM

## 2011-07-18 DIAGNOSIS — K2990 Gastroduodenitis, unspecified, without bleeding: Secondary | ICD-10-CM

## 2011-07-18 DIAGNOSIS — R079 Chest pain, unspecified: Secondary | ICD-10-CM

## 2011-07-18 DIAGNOSIS — J9610 Chronic respiratory failure, unspecified whether with hypoxia or hypercapnia: Secondary | ICD-10-CM | POA: Diagnosis present

## 2011-07-18 DIAGNOSIS — J441 Chronic obstructive pulmonary disease with (acute) exacerbation: Secondary | ICD-10-CM | POA: Diagnosis present

## 2011-07-18 DIAGNOSIS — I10 Essential (primary) hypertension: Secondary | ICD-10-CM | POA: Diagnosis present

## 2011-07-18 DIAGNOSIS — M4120 Other idiopathic scoliosis, site unspecified: Secondary | ICD-10-CM

## 2011-07-18 DIAGNOSIS — B029 Zoster without complications: Secondary | ICD-10-CM

## 2011-07-18 DIAGNOSIS — J209 Acute bronchitis, unspecified: Secondary | ICD-10-CM

## 2011-07-18 DIAGNOSIS — K648 Other hemorrhoids: Secondary | ICD-10-CM

## 2011-07-18 DIAGNOSIS — K219 Gastro-esophageal reflux disease without esophagitis: Secondary | ICD-10-CM | POA: Diagnosis present

## 2011-07-18 DIAGNOSIS — K449 Diaphragmatic hernia without obstruction or gangrene: Secondary | ICD-10-CM

## 2011-07-18 DIAGNOSIS — R1319 Other dysphagia: Secondary | ICD-10-CM

## 2011-07-18 DIAGNOSIS — R062 Wheezing: Secondary | ICD-10-CM

## 2011-07-18 HISTORY — DX: Unspecified glaucoma: H40.9

## 2011-07-18 HISTORY — DX: Psoriasis, unspecified: L40.9

## 2011-07-18 HISTORY — DX: Chronic respiratory failure, unspecified whether with hypoxia or hypercapnia: J96.10

## 2011-07-18 HISTORY — DX: Age-related osteoporosis without current pathological fracture: M81.0

## 2011-07-18 HISTORY — DX: Other idiopathic scoliosis, site unspecified: M41.20

## 2011-07-18 HISTORY — DX: Other specified disorders of adrenal gland: E27.8

## 2011-07-18 HISTORY — DX: Other specified postprocedural states: R11.2

## 2011-07-18 HISTORY — DX: Other specified postprocedural states: Z98.890

## 2011-07-18 HISTORY — DX: Other hemorrhoids: K64.8

## 2011-07-18 LAB — BASIC METABOLIC PANEL
Calcium: 9.3 mg/dL (ref 8.4–10.5)
GFR calc Af Amer: >90 mL/min (ref >90)
GFR calc non Af Amer: 82 mL/min — ABNORMAL LOW (ref >90)
Sodium: 140 mEq/L (ref 135–145)

## 2011-07-18 LAB — BLOOD GAS, ARTERIAL
Acid-base deficit: 0.2 mmol/L (ref 0.0–2.0)
Delivery systems: POSITIVE
FIO2: 0.5 %
Patient temperature: 98.6
TCO2: 21 mmol/L (ref 0–100)
pCO2 arterial: 39.2 mmHg (ref 35.0–45.0)

## 2011-07-18 LAB — DIFFERENTIAL
Basophils Absolute: 0 10*3/uL (ref 0.0–0.1)
Basophils Relative: 1 % (ref 0–1)
Eosinophils Absolute: 0.2 10*3/uL (ref 0.0–0.7)
Eosinophils Relative: 2 % (ref 0–5)

## 2011-07-18 LAB — CBC
MCH: 29.3 pg (ref 26.0–34.0)
MCV: 88.7 fL (ref 78.0–100.0)
Platelets: 238 10*3/uL (ref 150–400)
RDW: 12.9 % (ref 11.5–15.5)

## 2011-07-18 MED ORDER — POLYETHYLENE GLYCOL 3350 17 G PO PACK
17.0000 g | PACK | Freq: Every day | ORAL | Status: DC | PRN
  Filled 2011-07-18: qty 1

## 2011-07-18 MED ORDER — ONDANSETRON HCL 4 MG/2ML IJ SOLN: 4.0000 mg | Freq: Four times a day (QID) | INTRAMUSCULAR | Status: DC | PRN

## 2011-07-18 MED ORDER — LOSARTAN POTASSIUM 50 MG PO TABS
100.0000 mg | ORAL_TABLET | Freq: Every day | ORAL | Status: DC
  Administered 2011-07-18 – 2011-07-22 (×5): 100 mg via ORAL
  Filled 2011-07-18 (×5): qty 2

## 2011-07-18 MED ORDER — FLUTICASONE-SALMETEROL 250-50 MCG/DOSE IN AEPB
1.0000 | INHALATION_SPRAY | Freq: Every day | RESPIRATORY_TRACT | Status: DC
  Filled 2011-07-18: qty 14

## 2011-07-18 MED ORDER — ALBUTEROL SULFATE (5 MG/ML) 0.5% IN NEBU
INHALATION_SOLUTION | RESPIRATORY_TRACT | Status: AC
  Filled 2011-07-18: qty 0.5

## 2011-07-18 MED ORDER — ROSUVASTATIN CALCIUM 10 MG PO TABS
10.0000 mg | ORAL_TABLET | Freq: Every day | ORAL | Status: DC
  Administered 2011-07-18 – 2011-07-22 (×5): 10 mg via ORAL
  Filled 2011-07-18 (×5): qty 1

## 2011-07-18 MED ORDER — METHYLPREDNISOLONE SODIUM SUCC 125 MG IJ SOLR: 125.0000 mg | Freq: Once | INTRAMUSCULAR | Status: DC

## 2011-07-18 MED ORDER — ACETAMINOPHEN 325 MG PO TABS
650.0000 mg | ORAL_TABLET | Freq: Four times a day (QID) | ORAL | Status: DC | PRN
  Administered 2011-07-18: 650 mg via ORAL
  Filled 2011-07-18: qty 2

## 2011-07-18 MED ORDER — ALPRAZOLAM 0.5 MG PO TABS
0.5000 mg | ORAL_TABLET | Freq: Every evening | ORAL | Status: DC | PRN
  Administered 2011-07-18 – 2011-07-21 (×4): 0.5 mg via ORAL
  Filled 2011-07-18 (×4): qty 1

## 2011-07-18 MED ORDER — SODIUM CHLORIDE 0.9 % IJ SOLN
3.0000 mL | Freq: Two times a day (BID) | INTRAMUSCULAR | Status: DC
  Administered 2011-07-19 – 2011-07-20 (×4): 3 mL via INTRAVENOUS
  Administered 2011-07-21: 10:00:00 via INTRAVENOUS
  Administered 2011-07-21 – 2011-07-22 (×2): 3 mL via INTRAVENOUS

## 2011-07-18 MED ORDER — METHYLPREDNISOLONE SODIUM SUCC 125 MG IJ SOLR
INTRAMUSCULAR | Status: AC
  Administered 2011-07-18: 13:00:00
  Filled 2011-07-18: qty 2

## 2011-07-18 MED ORDER — IPRATROPIUM BROMIDE 0.02 % IN SOLN
0.5000 mg | Freq: Four times a day (QID) | RESPIRATORY_TRACT | Status: DC
  Administered 2011-07-18 – 2011-07-22 (×13): 0.5 mg via RESPIRATORY_TRACT
  Filled 2011-07-18 (×13): qty 2.5

## 2011-07-18 MED ORDER — SODIUM CHLORIDE 0.9 % IV SOLN: 250.0000 mL | INTRAVENOUS | Status: DC | PRN

## 2011-07-18 MED ORDER — ENOXAPARIN SODIUM 40 MG/0.4ML ~~LOC~~ SOLN
40.0000 mg | SUBCUTANEOUS | Status: DC
  Administered 2011-07-18: 40 mg via SUBCUTANEOUS
  Filled 2011-07-18 (×5): qty 0.4

## 2011-07-18 MED ORDER — ALBUTEROL SULFATE (5 MG/ML) 0.5% IN NEBU: 2.5000 mg | INHALATION_SOLUTION | RESPIRATORY_TRACT | Status: DC | PRN

## 2011-07-18 MED ORDER — ALBUTEROL SULFATE (5 MG/ML) 0.5% IN NEBU
5.0000 mg | INHALATION_SOLUTION | Freq: Once | RESPIRATORY_TRACT | Status: AC
  Administered 2011-07-18: 13:00:00 via RESPIRATORY_TRACT
  Filled 2011-07-18: qty 0.5

## 2011-07-18 MED ORDER — METHYLPREDNISOLONE SODIUM SUCC 125 MG IJ SOLR
60.0000 mg | Freq: Four times a day (QID) | INTRAMUSCULAR | Status: DC
  Administered 2011-07-18 – 2011-07-20 (×7): 60 mg via INTRAVENOUS
  Filled 2011-07-18 (×11): qty 0.96

## 2011-07-18 MED ORDER — IPRATROPIUM BROMIDE 0.02 % IN SOLN
0.5000 mg | Freq: Once | RESPIRATORY_TRACT | Status: AC
  Administered 2011-07-18: 13:00:00 via RESPIRATORY_TRACT
  Filled 2011-07-18: qty 2.5

## 2011-07-18 MED ORDER — IPRATROPIUM BROMIDE 0.02 % IN SOLN
RESPIRATORY_TRACT | Status: AC
  Filled 2011-07-18: qty 2.5

## 2011-07-18 MED ORDER — SODIUM CHLORIDE 0.9 % IJ SOLN: 3.0000 mL | INTRAMUSCULAR | Status: DC | PRN

## 2011-07-18 MED ORDER — DEXTROSE 5 % IV SOLN
1.0000 g | INTRAVENOUS | Status: DC
  Administered 2011-07-18 – 2011-07-20 (×3): 1 g via INTRAVENOUS
  Filled 2011-07-18 (×4): qty 10

## 2011-07-18 MED ORDER — OXYCODONE HCL 5 MG PO TABS
5.0000 mg | ORAL_TABLET | ORAL | Status: DC | PRN
  Administered 2011-07-19 (×2): 5 mg via ORAL
  Filled 2011-07-18 (×2): qty 1

## 2011-07-18 MED ORDER — ONDANSETRON HCL 4 MG PO TABS
4.0000 mg | ORAL_TABLET | Freq: Four times a day (QID) | ORAL | Status: DC | PRN
  Administered 2011-07-19: 4 mg via ORAL
  Filled 2011-07-18: qty 1

## 2011-07-18 MED ORDER — ALBUTEROL SULFATE (5 MG/ML) 0.5% IN NEBU
2.5000 mg | INHALATION_SOLUTION | Freq: Four times a day (QID) | RESPIRATORY_TRACT | Status: DC
  Administered 2011-07-18 – 2011-07-20 (×9): 2.5 mg via RESPIRATORY_TRACT
  Filled 2011-07-18 (×9): qty 0.5

## 2011-07-18 MED ORDER — PANTOPRAZOLE SODIUM 40 MG PO TBEC
40.0000 mg | DELAYED_RELEASE_TABLET | Freq: Every day | ORAL | Status: DC
  Administered 2011-07-18 – 2011-07-22 (×5): 40 mg via ORAL
  Filled 2011-07-18 (×5): qty 1

## 2011-07-18 MED ORDER — ACETAMINOPHEN 650 MG RE SUPP: 650.0000 mg | Freq: Four times a day (QID) | RECTAL | Status: DC | PRN

## 2011-07-18 NOTE — ED Notes (Signed)
Per ems pt from home, alert and oriented x4, ambulatory but causes respiratory distress. ems was called for shortness of breath. At scene pt had audible wheezing, pt could not speak in complete sentences, in tripod position, warm and dry. Pt complaint "if she lays down wheezing calms down, but as soon as she moves the wheezing starts again". Audible wheezing at scene. Productive cough with sputum. ems gave albuterol, atrovent, and solumedrol. 20 g left AC. Pt also had albuterol treatment before ems arrived. Pt has oxygen at home prn, pt has been using her oxygen the last few days. Pt does not tolerate mask well.

## 2011-07-18 NOTE — ED Notes (Addendum)
Report given to diehl, rn on floor

## 2011-07-18 NOTE — ED Notes (Signed)
Admitting nurse at bedside 

## 2011-07-18 NOTE — H&P (Signed)
Hospital Admission Note Date: 07/18/2011  Patient name: [name] Medical record number: [id number] Date of birth: Oct 21, 1930 Age: 78 y.o. Gender: female PCP: [name], MD, MD  Attending physician: Richarda Overlie, MD Emergency Contact:  Richard Warnick (husband) [phone] (home) Code Status: DNR  Chief Complaint: "Shortness of breath"  History of Present Illness: [name] is an 78 y.o. female with a PMH of chronic respiratory failure and COPD who presents to the hospital with progressive SOB, feeling "smothered" for the past 2 weeks.  Patient reports that she has been unable to do her usual house chores due to shortness of breath and fatigue.  She usually uses her oxygen intermittently, but over the past 2 weeks, has begun to use it more continuously.  She reports a cough, productive of thick white mucous.  No frank fever or chills.  Husband was recently seen and treated for a viral URI.  She has had her flu vaccination this year.  Upon initial evaluation in the emergency department, the patient was found to be hypoxic and with respiratory distress and therefore she was put on CPAP.  She improved and ultimately, the CPAP was weaned off. She subsequently was referred to the hospitalist service for further evaluation and treatment.  Past Medical History Past Medical History  Diagnosis Date  . History of colonic polyps   . COPD (chronic obstructive pulmonary disease)   . Diverticulitis of colon   . GERD (gastroesophageal reflux disease)   . HTN (hypertension)   . Osteoarthritis   . Hyperlipemia   . Aortic aneurysm, abdominal   . Ovarian cyst, left   . Psoriasis   . Chronic respiratory failure     Uses home oxygen at 2 L  . ADRENAL MASS 05/04/2007  . GLAUCOMA 03/30/2007  . HEMORRHOIDS, INTERNAL 03/23/2008  . OSTEOPOROSIS 03/30/2007  . SCOLIOSIS 03/30/2007  . PONV (postoperative nausea and vomiting)     Past Surgical History Past Surgical History  Procedure Date  . Kidney stone  surgery   . Appendectomy   . Tonsillectomy   . Hemiarthroplasty shoulder fracture   . Cataract extraction, bilateral   . Abdominal aortic aneurysm repair   . Abdominal hysterectomy     Meds: Prior to Admission medications   Medication Sig Start Date End Date Taking? Authorizing Provider  albuterol (PROVENTIL) (2.5 MG/3ML) 0.083% nebulizer solution Take 2.5 mg by nebulization every 6 (six) hours as needed.      Historical Provider, MD  ALPRAZolam Prudy Feeler) 0.5 MG tablet Take 1 tablet (0.5 mg total) by mouth at bedtime as needed. 02/24/11   [name], MD  atorvastatin (LIPITOR) 10 MG tablet Take 10 mg by mouth daily.      Historical Provider, MD  benzonatate (TESSALON PERLES) 100 MG capsule Take 1 capsule (100 mg total) by mouth every 6 (six) hours as needed for cough. 12/25/10 12/25/11  [name], MD  esomeprazole (NEXIUM) 40 MG capsule Take 1 capsule (40 mg total) by mouth daily. 05/23/11   [name], MD  Fluticasone-Salmeterol (ADVAIR DISKUS) 250-50 MCG/DOSE AEPB Inhale 1 puff into the lungs daily.      Historical Provider, MD  Garlic (GARLIQUE) 400 MG TBEC Take 1 tablet by mouth daily.      Historical Provider, MD  halobetasol (ULTRAVATE) 0.05 % cream Apply 1 application topically as needed.      Historical Provider, MD  ipratropium-albuterol (DUONEB) 0.5-2.5 (3) MG/3ML SOLN Take 3 mLs by nebulization as directed. 06/03/11   [name], MD  losartan (COZAAR) 100 MG tablet TAKE 1 TABLET (100 MG TOTAL) BY MOUTH DAILY. 05/12/11   [name], MD  losartan (COZAAR) 100 MG tablet Take 1 tablet (100 mg total) by mouth daily. 05/13/11   [name], MD  meclizine (ANTIVERT) 25 MG tablet Take 25 mg by mouth every 6 (six) hours as needed.      Historical Provider, MD  predniSONE (DELTASONE) 10 MG tablet 5 tabs po x 3 days, 4 tabs po x 3 days, 3 tabs po x 3 days, 2 tabs po x 2 days, 1 tab po x 2 days.  Stop Dispense qs 04/30/11   [name], MD  rosuvastatin (CRESTOR) 10 MG tablet Take 10 mg by mouth  daily.      Historical Provider, MD  traMADol (ULTRAM) 50 MG tablet Take 50 mg by mouth 2 (two) times daily.      Historical Provider, MD    Allergies: Clarithromycin; Cleartuss dh; Iohexol; Moxifloxacin; and Sulfonamide derivatives  Social History: History   Social History  . Marital Status: Married    Spouse Name: Richard    Number of Children: 2  . Years of Education: 11   Occupational History  . [employer]   . Factory work    Social History Main Topics  . Smoking status: Former Smoker -- 0.5 packs/day for 40 years    Types: Cigarettes    Quit date: 07/17/2008  . Smokeless tobacco: Never Used  . Alcohol Use: No  . Drug Use: No  . Sexually Active: No   Other Topics Concern  . Not on file   Social History Narrative   Married.  Lives in [place] with her husband.  Normally independent of ADLs and ambulation.    Family History:  Family History  Problem Relation Age of Onset  . Heart attack Mother   . Heart attack Father   . Deep vein thrombosis Father   . Cancer Brother     Pancreas and colon    Review of Systems: Constitutional: No fever or chills;  Appetite diminished x 2 weeks; +weight gain.  HEENT: No blurry vision or diplopia, no pharyngitis or dysphagia, + post nasal drip CV: No chest pain or arrhythmia.  Resp: +SOB, + cough. GI: + nausea, no vomiting, no diarrhea, no melena or hematochezia.  GU: No dysuria or hematuria.  MSK:  myalgias anterior left chest wall, shoulder, and back/ +arthralgias.  Neuro:  No headache or focal neurological deficits.  Psych: No depression or anxiety, + insomnia.  Endo: No thyroid disease or DM.  Skin: No rashes or lesions, h/o psoriasis.  Heme: No anemia or blood dyscrasia   Physical Exam: Blood pressure 178/79, pulse 90, temperature 98.3 F (36.8 C), temperature source Oral, resp. rate 23, SpO2 99.00%. BP 178/79  Pulse 90  Temp(Src) 98.3 F (36.8 C) (Oral)  Resp 23  SpO2 99%  General Appearance:    Alert, cooperative, no  distress, appears stated age  Head:    Normocephalic, without obvious abnormality, atraumatic  Eyes:    PERRL, conjunctiva/corneas clear, EOM's intact  Ears:    Normal external ear canals, both ears  Nose:   Nares normal, septum midline, mucosa normal, no drainage    or sinus tenderness  Throat:   Lips, mucosa, and tongue normal; teeth and gums normal  Neck:   Supple, symmetrical, trachea midline, no adenopathy;    thyroid:  no enlargement/tenderness/nodules; no carotid   bruit or JVD  Back:     Symmetric, no  curvature, ROM normal, no CVA tenderness  Lungs:     Bilateral high pitched wheezes, respirations mildly labored  Chest Wall:    No tenderness or deformity   Heart:    Regular rate and rhythm, S1 and S2 normal, no murmur, rub   or gallop  Abdomen:     Soft, non-tender, bowel sounds active all four quadrants,    no masses, no organomegaly  Extremities:   Extremities normal, atraumatic, no cyanosis or edema  Pulses:   2+ and symmetric all extremities  Skin:   Skin color, texture, turgor normal, a few psoriatic plaques  Neurologic:   Non-focal   Lab results: Basic Metabolic Panel:  Lab 07/18/11 0981  NA 140  K 3.8  CL 104  CO2 26  GLUCOSE 144*  BUN 19  CREATININE 0.67  CALCIUM 9.3  MG --  PHOS --   GFR The CrCl is unknown because both a height and weight (above a minimum accepted value) are required for this calculation.  CBC:  Lab 07/18/11 1355  WBC 6.2  NEUTROABS 4.5  HGB 14.0  HCT 42.4  MCV 88.7  PLT 238    Imaging results:  Dg Chest Port 1 View  07/18/2011  *RADIOLOGY REPORT*  Clinical Data: Shortness of breath.  PORTABLE CHEST - 1 VIEW  Comparison: 11/01/2008.  Findings: 1342 hours. Hyperexpansion is consistent with emphysema. Interstitial markings are diffusely coarsened with chronic features. The cardiopericardial silhouette is within normal limits for size. The lungs are clear without focal infiltrate, edema, pneumothorax or pleural effusion. Telemetry  leads overlie the chest.  IMPRESSION: Emphysema with underlying chronic interstitial changes.  No acute cardiopulmonary process.  Original Report Authenticated By: ERIC A. MANSELL, M.D.    Assessment & Plan: Principal Problem:  *Acute-on-chronic respiratory failure secondary to COPD (chronic obstructive pulmonary disease) Exacerbation We will admit the patient and begin her on empiric therapy with Solu-Medrol, nebulized bronchodilator therapy, and continue her Advair. We will continue her on supplemental oxygen. Although her chest x-ray is clear, we'll start her on empiric Rocephin given her change in the quality of her sputum. She is allergic to clarithromycin so I would avoid concurrent azithromycin. Active Problems:  GERD (gastroesophageal reflux disease) We will continue PPI therapy.  HTN (hypertension) We will continue Cozaar therapy and make adjustments to her treatment if needed.  Hyperlipemia We will continue her statin therapy.  Prophylaxis: Lovenox has been ordered for DVT prophylaxis  Time Spent On Admission: One hour  Celvin Taney 07/18/2011, 3:53 PM Pager [phone]

## 2011-07-18 NOTE — ED Notes (Signed)
Pt reports having left sided chest pain going to back rated 5/10. States she thinks it is r/t coughing.  RT at bedside for bipap placement.

## 2011-07-18 NOTE — ED Notes (Signed)
Rt called upon assessment of pt, dr notified. Md in room

## 2011-07-18 NOTE — ED Notes (Signed)
[id number]<HQ> Expected date:<BR> Expected time:<BR> Means of arrival:<BR> Comments:<BR> M51 - SOB

## 2011-07-18 NOTE — ED Notes (Signed)
Report attempted to be called. Ed rn notified pt will be on [facility] and for ed rn to call back in a few minutes to give report.

## 2011-07-18 NOTE — ED Notes (Signed)
RT in room.

## 2011-07-18 NOTE — ED Provider Notes (Signed)
History     CSN: [id number]  Arrival date & time 07/18/11  1256   First MD Initiated Contact with Patient 07/18/11 1302      Chief Complaint  Patient presents with  . Respiratory Distress     Patient is a 78 y.o. female presenting with shortness of breath. The history is provided by the patient. The history is limited by the condition of the patient.  Shortness of Breath  The current episode started today. The onset was gradual. The problem occurs continuously. The problem has been rapidly worsening. The problem is severe. The symptoms are relieved by nothing. The symptoms are aggravated by nothing. Associated symptoms include shortness of breath.   Pt brought in by EMS for shortness of breath She was given albuterol/atrovent/solumedrol prior to ER arrival No other details are known as patient in severe distress at this time  Past Medical History  Diagnosis Date  . History of colonic polyps   . COPD (chronic obstructive pulmonary disease)   . Diverticulitis of colon   . GERD (gastroesophageal reflux disease)   . HTN (hypertension)   . Osteoarthritis   . Hyperlipemia   . Aortic aneurysm, abdominal   . Ovarian cyst, left     Past Surgical History  Procedure Date  . Kidney stone surgery   . Appendectomy   . Tonsillectomy   . Hemiarthroplasty shoulder fracture   . Cataract extraction, bilateral   . Abdominal aortic aneurysm repair     Family History  Problem Relation Age of Onset  . Heart attack Mother   . Heart attack Father   . Deep vein thrombosis Father   . Cancer Brother     Pancreas and colon    History  Substance Use Topics  . Smoking status: Former Games developer  . Smokeless tobacco: Not on file  . Alcohol Use: Not on file    OB History    Grav Para Term Preterm Abortions TAB SAB Ect Mult Living                  Review of Systems  Unable to perform ROS: Unstable vital signs  Respiratory: Positive for shortness of breath.     Allergies    Clarithromycin; Cleartuss dh; Iohexol; Moxifloxacin; and Sulfonamide derivatives  Home Medications   Current Outpatient Rx  Name Route Sig Dispense Refill  . ALBUTEROL SULFATE (2.5 MG/3ML) 0.083% IN NEBU Nebulization Take 2.5 mg by nebulization every 6 (six) hours as needed.      . ALPRAZOLAM 0.5 MG PO TABS Oral Take 1 tablet (0.5 mg total) by mouth at bedtime as needed. 90 tablet 0  . ATORVASTATIN CALCIUM 10 MG PO TABS Oral Take 10 mg by mouth daily.      [name] BENZONATATE 100 MG PO CAPS Oral Take 1 capsule (100 mg total) by mouth every 6 (six) hours as needed for cough. 30 capsule 0  . ESOMEPRAZOLE MAGNESIUM 40 MG PO CPDR Oral Take 1 capsule (40 mg total) by mouth daily. 30 capsule 12  . FLUTICASONE-SALMETEROL 250-50 MCG/DOSE IN AEPB Inhalation Inhale 1 puff into the lungs daily.      [name] GARLIC 400 MG PO TBEC Oral Take 1 tablet by mouth daily.      [name] HALOBETASOL PROPIONATE 0.05 % EX CREA Topical Apply 1 application topically as needed.      . IPRATROPIUM-ALBUTEROL 0.5-2.5 (3) MG/3ML IN SOLN Nebulization Take 3 mLs by nebulization as directed. 360 mL 3  . LOSARTAN POTASSIUM 100 MG  PO TABS  TAKE 1 TABLET (100 MG TOTAL) BY MOUTH DAILY. 30 tablet 6  . LOSARTAN POTASSIUM 100 MG PO TABS Oral Take 1 tablet (100 mg total) by mouth daily. 30 tablet 6  . MECLIZINE HCL 25 MG PO TABS Oral Take 25 mg by mouth every 6 (six) hours as needed.      [name] PREDNISONE 10 MG PO TABS  5 tabs po x 3 days, 4 tabs po x 3 days, 3 tabs po x 3 days, 2 tabs po x 2 days, 1 tab po x 2 days.  Stop Dispense qs 1 tablet 0  . ROSUVASTATIN CALCIUM 10 MG PO TABS Oral Take 10 mg by mouth daily.      . TRAMADOL HCL 50 MG PO TABS Oral Take 50 mg by mouth 2 (two) times daily.        BP 178/79  Pulse 90  Temp(Src) 98.3 F (36.8 C) (Oral)  Resp 23  SpO2 100%  Physical Exam CONSTITUTIONAL: Well developed/well nourished HEAD AND FACE: Normocephalic/atraumatic EYES: EOMI/PERRL ENMT: Mucous membranes moist NECK: supple no meningeal  signs SPINE:entire spine nontender CV: tachycardic S1/S2 noted, no murmurs/rubs/gallops noted LUNGS: tachypnea, wheezing bilaterally, only able to speak short sentences ABDOMEN: soft, nontender, no rebound or guarding GU:no cva tenderness NEURO: Pt is awake/alert, moves all extremitiesx4 EXTREMITIES: pulses normal, full ROM SKIN: warm, color normal PSYCH: no abnormalities of mood noted   ED Course  Procedures (including critical care time)  Labs Reviewed  BLOOD GAS, ARTERIAL - Abnormal; Notable for the following:    pH, Arterial 7.401 (*)    pO2, Arterial 218.0 (*)    All other components within normal limits  CBC  DIFFERENTIAL  BASIC METABOLIC PANEL   Pt seen on arrival and bipap placed due to resp distress She is now improving and after ABG the mask was removed Workup pending at this time  2:51 PM Pt improved but Chewning with wheeze She reports symptoms of cough/short of breath/wheeze for past several days She uses oxygen at home as needed Will admit given severity of presentation  D/w dr Darnelle Catalan, [facility] will admit Pt stabilized in the ED   MDM  Nursing notes reviewed and considered in documentation All labs/vitals reviewed and considered xrays reviewed and considered        Date: 07/18/2011  Rate: 105  Rhythm: sinus tachycardia  QRS Axis: left  Intervals: normal  ST/T Wave abnormalities: nonspecific ST changes  Conduction Disutrbances:none     Joya Gaskins, MD 07/18/11 1507

## 2011-07-19 LAB — COMPREHENSIVE METABOLIC PANEL
ALT: 12 U/L (ref 0–35)
AST: 16 U/L (ref 0–37)
Calcium: 9.6 mg/dL (ref 8.4–10.5)
Creatinine, Ser: 0.61 mg/dL (ref 0.50–1.10)
GFR calc Af Amer: >90 mL/min (ref >90)
Glucose, Bld: 153 mg/dL — ABNORMAL HIGH (ref 70–99)
Sodium: 140 mEq/L (ref 135–145)
Total Protein: 7.4 g/dL (ref 6.0–8.3)

## 2011-07-19 LAB — CBC
HCT: 42.8 % (ref 36.0–46.0)
Hemoglobin: 14.2 g/dL (ref 12.0–15.0)
MCHC: 33.2 g/dL (ref 30.0–36.0)
RBC: 4.81 MIL/uL (ref 3.87–5.11)

## 2011-07-19 MED ORDER — HYDRALAZINE HCL 20 MG/ML IJ SOLN
10.0000 mg | Freq: Four times a day (QID) | INTRAMUSCULAR | Status: DC | PRN
  Administered 2011-07-20 – 2011-07-22 (×2): 10 mg via INTRAVENOUS
  Filled 2011-07-19 (×3): qty 1

## 2011-07-19 MED ORDER — BENZONATATE 100 MG PO CAPS
100.0000 mg | ORAL_CAPSULE | Freq: Three times a day (TID) | ORAL | Status: DC
  Administered 2011-07-19: 100 mg via ORAL
  Filled 2011-07-19 (×3): qty 1

## 2011-07-19 MED ORDER — FLUTICASONE-SALMETEROL 250-50 MCG/DOSE IN AEPB: 1.0000 | INHALATION_SPRAY | Freq: Two times a day (BID) | RESPIRATORY_TRACT | Status: DC

## 2011-07-19 MED ORDER — HYDROCOD POLST-CHLORPHEN POLST 10-8 MG/5ML PO LQCR
5.0000 mL | Freq: Two times a day (BID) | ORAL | Status: DC | PRN
  Administered 2011-07-19 – 2011-07-22 (×6): 5 mL via ORAL
  Filled 2011-07-19 (×6): qty 5

## 2011-07-19 NOTE — Progress Notes (Signed)
Subjective: Patient states she is overall feeling better and breathing better.  She [place] gets SOB when she walks to bathroom or does anything active.  Patient is on 2L of home O2 and has seen a pulmonologist in the past.  No chest pain, no nausea, no vomiting.  Objective: Vital signs in last 24 hours: Filed Vitals:   07/18/11 2111 07/18/11 2201 07/19/11 0226 07/19/11 0920  BP:  154/89    Pulse:  85    Temp:  97.9 F (36.6 C)    TempSrc:  Oral    Resp:  20    Height:      Weight:      SpO2: 97% 95% 96% 96%    Intake/Output Summary (Last 24 hours) at 07/19/11 1000 Last data filed at 07/19/11 0655  Gross per 24 hour  Intake    240 ml  Output    550 ml  Net   -310 ml    Physical Exam: General: Awake, Oriented, No acute distress. On 2L of O2 HEENT: EOMI. Neck: Supple, no JVD CV: S1 and S2, RRR, no murmurs Lungs: decreased breath sounds with B/L wheezing Abdomen: Soft, Nontender, Nondistended, +bowel sounds. Ext: Good pulses. Trace edema.   Lab Results:  Basename 07/19/11 0330 07/18/11 1355  NA 140 140  K 3.6 3.8  CL 102 104  CO2 25 26  GLUCOSE 153* 144*  BUN 17 19  CREATININE 0.61 0.67  CALCIUM 9.6 9.3  MG -- --  PHOS -- --    Basename 07/19/11 0330  AST 16  ALT 12  ALKPHOS 102  BILITOT 0.2*  PROT 7.4  ALBUMIN 4.0     Basename 07/19/11 0330 07/18/11 1355  WBC 4.6 6.2  NEUTROABS -- 4.5  HGB 14.2 14.0  HCT 42.8 42.4  MCV 89.0 88.7  PLT 257 238    Micro Results: No results found for this or any previous visit (from the past 240 hour(s)).  Studies/Results: Dg Chest Port 1 View  07/18/2011  *RADIOLOGY REPORT*  Clinical Data: Shortness of breath.  PORTABLE CHEST - 1 VIEW  Comparison: 11/01/2008.  Findings: 1342 hours. Hyperexpansion is consistent with emphysema. Interstitial markings are diffusely coarsened with chronic features. The cardiopericardial silhouette is within normal limits for size. The lungs are clear without focal infiltrate, edema,  pneumothorax or pleural effusion. Telemetry leads overlie the chest.  IMPRESSION: Emphysema with underlying chronic interstitial changes.  No acute cardiopulmonary process.  Original Report Authenticated By: ERIC A. MANSELL, M.D.    Medications: I have reviewed the patient's current medications. Scheduled Meds:   . albuterol  2.5 mg Nebulization Q6H  . albuterol  5 mg Nebulization Once  . benzonatate  100 mg Oral TID  . cefTRIAXone (ROCEPHIN)  IV  1 g Intravenous Q24H  . enoxaparin  40 mg Subcutaneous Q24H  . Fluticasone-Salmeterol  1 puff Inhalation Daily  . ipratropium  0.5 mg Nebulization Once  . ipratropium  0.5 mg Nebulization Q6H  . losartan  100 mg Oral Daily  . methylPREDNISolone (SOLU-MEDROL) injection  60 mg Intravenous Q6H  . methylPREDNISolone sodium succinate      . pantoprazole  40 mg Oral Daily  . rosuvastatin  10 mg Oral Daily  . sodium chloride  3 mL Intravenous Q12H  . DISCONTD: methylPREDNISolone (SOLU-MEDROL) injection  125 mg Intravenous Once   Continuous Infusions:  PRN Meds:.sodium chloride, acetaminophen, acetaminophen, albuterol, ALPRAZolam, ondansetron (ZOFRAN) IV, ondansetron, oxyCODONE, polyethylene glycol, sodium chloride  Assessment/Plan:  1. Acute-on-chronic respiratory failure- has  been weaned of bipap, on home dose of 2L O2 but [place] dyspneic with movement, abx, steroids, nebs  2. COPD (chronic obstructive pulmonary disease)- patient has outpt pulmonologist  3.  GERD (gastroesophageal reflux disease)  4.  HTN (hypertension)- add PRN hydralazine as patients BP has been elevated for last few checks, cont PO cozaar  5.  Hyperlipemia- continue statin  6. Dispo: will begin to wean steroids and plan on D/C once patient back to baseline for close outpt follow up     LOS: 1 day  VANN, JESSICA, DO 07/19/2011, 10:00 AM

## 2011-07-20 LAB — BASIC METABOLIC PANEL
BUN: 38 mg/dL — ABNORMAL HIGH (ref 6–23)
CO2: 27 mEq/L (ref 19–32)
GFR calc non Af Amer: 69 mL/min — ABNORMAL LOW (ref >90)
Glucose, Bld: 141 mg/dL — ABNORMAL HIGH (ref 70–99)
Potassium: 4.4 mEq/L (ref 3.5–5.1)
Sodium: 141 mEq/L (ref 135–145)

## 2011-07-20 MED ORDER — METHYLPREDNISOLONE SODIUM SUCC 40 MG IJ SOLR
40.0000 mg | Freq: Two times a day (BID) | INTRAMUSCULAR | Status: DC
  Administered 2011-07-20 – 2011-07-21 (×2): 40 mg via INTRAVENOUS
  Filled 2011-07-20 (×2): qty 1

## 2011-07-20 MED ORDER — ALBUTEROL SULFATE (5 MG/ML) 0.5% IN NEBU
2.5000 mg | INHALATION_SOLUTION | Freq: Four times a day (QID) | RESPIRATORY_TRACT | Status: DC
  Administered 2011-07-21 – 2011-07-22 (×4): 2.5 mg via RESPIRATORY_TRACT
  Filled 2011-07-20 (×4): qty 0.5

## 2011-07-20 NOTE — Progress Notes (Signed)
Subjective: Patient states she is overall feeling better and breathing better.  She [place] gets SOB when she walks to bathroom or does anything active but less than yesterday.  Patient is on 2L of home O2 and has seen a pulmonologist in the past.  No chest pain, no nausea, no vomiting.  Objective: Vital signs in last 24 hours: Filed Vitals:   07/20/11 0235 07/20/11 0500 07/20/11 0530 07/20/11 0821  BP:  150/96 140/70   Pulse:  90    Temp:  98 F (36.7 C)    TempSrc:  Oral    Resp:  20    Height:      Weight:      SpO2: 92% 92%  98%    Intake/Output Summary (Last 24 hours) at 07/20/11 1034 Last data filed at 07/20/11 0820  Gross per 24 hour  Intake    360 ml  Output      0 ml  Net    360 ml    Physical Exam: General: Awake, Oriented, No acute distress. On 2L of O2 HEENT: EOMI. Neck: Supple, no JVD CV: S1 and S2, RRR, no murmurs Lungs: no wheezing, breath sounds clear B/L Abdomen: Soft, Nontender, Nondistended, +bowel sounds. Ext: Good pulses. No edema. Skin: senile changes   Lab Results:  Basename 07/20/11 0401 07/19/11 0330  NA 141 140  K 4.4 3.6  CL 105 102  CO2 27 25  GLUCOSE 141* 153*  BUN 38* 17  CREATININE 0.80 0.61  CALCIUM 9.3 9.6  MG -- --  PHOS -- --    Basename 07/19/11 0330  AST 16  ALT 12  ALKPHOS 102  BILITOT 0.2*  PROT 7.4  ALBUMIN 4.0     Basename 07/19/11 0330 07/18/11 1355  WBC 4.6 6.2  NEUTROABS -- 4.5  HGB 14.2 14.0  HCT 42.8 42.4  MCV 89.0 88.7  PLT 257 238    Micro Results: No results found for this or any previous visit (from the past 240 hour(s)).  Studies/Results: Dg Chest Port 1 View  07/18/2011  *RADIOLOGY REPORT*  Clinical Data: Shortness of breath.  PORTABLE CHEST - 1 VIEW  Comparison: 11/01/2008.  Findings: 1342 hours. Hyperexpansion is consistent with emphysema. Interstitial markings are diffusely coarsened with chronic features. The cardiopericardial silhouette is within normal limits for size. The lungs are  clear without focal infiltrate, edema, pneumothorax or pleural effusion. Telemetry leads overlie the chest.  IMPRESSION: Emphysema with underlying chronic interstitial changes.  No acute cardiopulmonary process.  Original Report Authenticated By: ERIC A. MANSELL, M.D.    Medications: I have reviewed the patient's current medications. Scheduled Meds:    . albuterol  2.5 mg Nebulization Q6H  . cefTRIAXone (ROCEPHIN)  IV  1 g Intravenous Q24H  . enoxaparin  40 mg Subcutaneous Q24H  . Fluticasone-Salmeterol  1 puff Inhalation BID  . ipratropium  0.5 mg Nebulization Q6H  . losartan  100 mg Oral Daily  . methylPREDNISolone (SOLU-MEDROL) injection  40 mg Intravenous Q12H  . pantoprazole  40 mg Oral Daily  . rosuvastatin  10 mg Oral Daily  . sodium chloride  3 mL Intravenous Q12H  . DISCONTD: benzonatate  100 mg Oral TID  . DISCONTD: methylPREDNISolone (SOLU-MEDROL) injection  60 mg Intravenous Q6H   Continuous Infusions:  PRN Meds:.sodium chloride, acetaminophen, acetaminophen, albuterol, ALPRAZolam, chlorpheniramine-HYDROcodone, hydrALAZINE, ondansetron (ZOFRAN) IV, ondansetron, oxyCODONE, polyethylene glycol, sodium chloride  Assessment/Plan:  1. Acute-on-chronic respiratory failure- has been weaned of bipap, on home dose of 2L O2 but  [place] dyspneic with movement, IV abx day #2, steroids- wean to PO, nebs  2. COPD (chronic obstructive pulmonary disease)- patient has outpt pulmonologist who she will need to follow up with  3.  GERD (gastroesophageal reflux disease)  4.  HTN (hypertension)- add PRN hydralazine as patients BP has been elevated for last few checks, cont PO cozaar  5.  Hyperlipemia- continue statin  6. Dispo: will begin to wean steroids and plan on D/C once patient back to baseline for close outpt follow up     LOS: 2 days  Miaya Lafontant, DO 07/20/2011, 10:34 AM

## 2011-07-21 ENCOUNTER — Telehealth: Payer: Self-pay | Admitting: *Deleted

## 2011-07-21 MED ORDER — CEFUROXIME AXETIL 500 MG PO TABS
500.0000 mg | ORAL_TABLET | Freq: Two times a day (BID) | ORAL | Status: DC
  Administered 2011-07-21 – 2011-07-22 (×2): 500 mg via ORAL
  Filled 2011-07-21 (×3): qty 1

## 2011-07-21 MED ORDER — PREDNISONE 20 MG PO TABS
40.0000 mg | ORAL_TABLET | Freq: Two times a day (BID) | ORAL | Status: DC
  Administered 2011-07-21 – 2011-07-22 (×2): 40 mg via ORAL
  Filled 2011-07-21 (×3): qty 2

## 2011-07-21 MED ORDER — PREDNISONE 20 MG PO TABS
40.0000 mg | ORAL_TABLET | Freq: Two times a day (BID) | ORAL | Status: DC
  Filled 2011-07-21 (×2): qty 2

## 2011-07-21 MED ORDER — DOXYCYCLINE HYCLATE 100 MG PO TABS
100.0000 mg | ORAL_TABLET | Freq: Two times a day (BID) | ORAL | Status: DC
  Administered 2011-07-21 – 2011-07-22 (×3): 100 mg via ORAL
  Filled 2011-07-21 (×4): qty 1

## 2011-07-21 MED ORDER — FUROSEMIDE 10 MG/ML IJ SOLN
40.0000 mg | Freq: Once | INTRAMUSCULAR | Status: AC
  Administered 2011-07-21: 40 mg via INTRAVENOUS
  Filled 2011-07-21: qty 4

## 2011-07-21 NOTE — Telephone Encounter (Signed)
Call the pt husband today and tell him I will round on the pt tomorrow when I return to town

## 2011-07-21 NOTE — Telephone Encounter (Signed)
Patient's husband stopped by the office stating that patient had to go to [facility] Friday because she was having difficulty breathing and is [place] there. Patient requested that the hospital call her pulmonologist (Dr. Delford Field) which they would not do. Patient was informed that she would have to get her PCP to call him to get him to see her at the hospital. Explained to the patient's husband that she should have the doctor at the hospital request that Dr. Delford Field come to see her. Patient's husband stated that they had ask them to do that and they would not. Patient's husband requested that you call the patient at the hospital at [phone].

## 2011-07-21 NOTE — Progress Notes (Signed)
Subjective: Patient states she is overall feeling better and breathing better. She [place] gets SOB when she walks to bathroom or does anything active but less than yesterday. Patient is on 2L of home O2 and has seen a pulmonologist in the past. No chest pain, no nausea, no vomiting.   Objective: Vital signs in last 24 hours: Filed Vitals:   07/20/11 2133 07/21/11 0451 07/21/11 0849 07/21/11 1400  BP:  148/90  160/94  Pulse:  80  83  Temp:  98 F (36.7 C)  97.8 F (36.6 C)  TempSrc:  Oral  Oral  Resp:  20  18  Height:      Weight:      SpO2: 96% 93% 93% 92%    Intake/Output Summary (Last 24 hours) at 07/21/11 1406 Last data filed at 07/21/11 0450  Gross per 24 hour  Intake    560 ml  Output      0 ml  Net    560 ml    Weight change:  General: Awake, Oriented, No acute distress. On 2L of O2  HEENT: EOMI.  Neck: Supple, no JVD  CV: S1 and S2, RRR, no murmurs  Lungs: no wheezing, breath sounds clear B/L  Abdomen: Soft, Nontender, Nondistended, +bowel sounds.  Ext: Good pulses. No edema.  Skin: senile changes    Lab Results: No results found for this or any previous visit (from the past 24 hour(s)).   Micro: No results found for this or any previous visit (from the past 240 hour(s)).  Studies/Results: No results found.  Medications:  Scheduled Meds:   . albuterol  2.5 mg Nebulization Q6H WA  . cefUROXime  500 mg Oral BID WC  . doxycycline  100 mg Oral Q12H  . enoxaparin  40 mg Subcutaneous Q24H  . Fluticasone-Salmeterol  1 puff Inhalation BID  . furosemide  40 mg Intravenous Once  . ipratropium  0.5 mg Nebulization Q6H  . losartan  100 mg Oral Daily  . pantoprazole  40 mg Oral Daily  . predniSONE  40 mg Oral BID  . rosuvastatin  10 mg Oral Daily  . sodium chloride  3 mL Intravenous Q12H  . DISCONTD: albuterol  2.5 mg Nebulization Q6H  . DISCONTD: cefTRIAXone (ROCEPHIN)  IV  1 g Intravenous Q24H  . DISCONTD: methylPREDNISolone (SOLU-MEDROL) injection  40 mg  Intravenous Q12H   Continuous Infusions:  PRN Meds:.sodium chloride, acetaminophen, acetaminophen, albuterol, ALPRAZolam, chlorpheniramine-HYDROcodone, hydrALAZINE, ondansetron (ZOFRAN) IV, ondansetron, oxyCODONE, polyethylene glycol, sodium chloride   Assessment:   1. Acute-on-chronic respiratory failure- has been weaned of bipap, on home dose of 2L O2 but [place] dyspneic with movement, IV abx day #2, steroids- wean to PO, nebs ,po setroids, switch to po abx , allregic to flurorquinolons and calrithromycin  2. COPD (chronic obstructive pulmonary disease)- patient has outpt pulmonologist who she will need to follow up with  3. GERD (gastroesophageal reflux disease)  4. HTN (hypertension)- add PRN hydralazine as patients BP has been elevated for last few checks, cont PO cozaar  5. Hyperlipemia- continue statin  6. Dispo: will begin to wean steroids and plan on D/C tomorrow  once patient back to baseline for close outpt follow up   Evaluate for home o2      LOS: 3 days   [facility] 07/21/2011, 2:06 PM

## 2011-07-21 NOTE — Telephone Encounter (Signed)
I will forward this note to Dr. Delford Field to get his input.

## 2011-07-21 NOTE — Telephone Encounter (Signed)
Spouse is aware. Nothing further needed 

## 2011-07-21 NOTE — Progress Notes (Signed)
UR complete 

## 2011-07-22 DIAGNOSIS — J449 Chronic obstructive pulmonary disease, unspecified: Secondary | ICD-10-CM

## 2011-07-22 DIAGNOSIS — J9620 Acute and chronic respiratory failure, unspecified whether with hypoxia or hypercapnia: Secondary | ICD-10-CM

## 2011-07-22 MED ORDER — CEFUROXIME AXETIL 500 MG PO TABS: 500.0000 mg | ORAL_TABLET | Freq: Two times a day (BID) | ORAL | Status: AC

## 2011-07-22 MED ORDER — ALBUTEROL SULFATE (5 MG/ML) 0.5% IN NEBU: 2.5000 mg | INHALATION_SOLUTION | RESPIRATORY_TRACT | Status: DC | PRN

## 2011-07-22 MED ORDER — HYDROCOD POLST-CHLORPHEN POLST 10-8 MG/5ML PO LQCR: 5.0000 mL | Freq: Two times a day (BID) | ORAL | Status: DC | PRN

## 2011-07-22 MED ORDER — PREDNISONE 20 MG PO TABS: 40.0000 mg | ORAL_TABLET | Freq: Every day | ORAL | Status: AC

## 2011-07-22 MED ORDER — IPRATROPIUM BROMIDE 0.02 % IN SOLN: 0.5000 mg | Freq: Three times a day (TID) | RESPIRATORY_TRACT | Status: DC

## 2011-07-22 MED ORDER — DOXYCYCLINE HYCLATE 100 MG PO TABS: 100.0000 mg | ORAL_TABLET | Freq: Two times a day (BID) | ORAL | Status: AC

## 2011-07-22 NOTE — Progress Notes (Signed)
Name: [name] MRN: [id number] DOB: 1930/12/19  DOS: 07/22/2011     LOS: 4  PULMONARY  PROGRESS NOTE  Patient description: 64XB MW with hx of Advanced Copd O2 dependent.  Golds III Adm to [facility] 07/18/11 with acute on chronic resp failure Subj.  Pt improved and ready for d/c. I apprec [facility] care.  I had not seen pt since 1/11.   Scheduled Medications:    . albuterol  2.5 mg Nebulization Q6H WA  . cefUROXime  500 mg Oral BID WC  . doxycycline  100 mg Oral Q12H  . enoxaparin  40 mg Subcutaneous Q24H  . Fluticasone-Salmeterol  1 puff Inhalation BID  . furosemide  40 mg Intravenous Once  . ipratropium  0.5 mg Nebulization Q6H  . losartan  100 mg Oral Daily  . pantoprazole  40 mg Oral Daily  . predniSONE  40 mg Oral BID  . rosuvastatin  10 mg Oral Daily  . sodium chloride  3 mL Intravenous Q12H  . DISCONTD: cefTRIAXone (ROCEPHIN)  IV  1 g Intravenous Q24H  . DISCONTD: methylPREDNISolone (SOLU-MEDROL) injection  40 mg Intravenous Q12H  . DISCONTD: predniSONE  40 mg Oral BID   PRN Medications: sodium chloride, acetaminophen, acetaminophen, albuterol, ALPRAZolam, chlorpheniramine-HYDROcodone, hydrALAZINE, ondansetron (ZOFRAN) IV, ondansetron, oxyCODONE, polyethylene glycol, sodium chloride Hemodynamic parameters:   Intake/Output: 02/04 0701 - 02/05 0700 In: 720 [P.O.:720] Out: -   Ventilator  Settings:   Vital Signs: Temp:  [97.8 F (36.6 C)-98.2 F (36.8 C)] 98.2 F (36.8 C) (02/05 1015) Pulse Rate:  [80-96] 96  (02/05 1015) Resp:  [18-20] 20  (02/05 1015) BP: (155-170)/(73-97) 169/81 mmHg (02/05 1015) SpO2:  [84 %-100 %] 88 % (02/05 1025) Physical Examination: Neuro: awake and alert HEENT:  Dry mucus membranes Heart:  RRR, no M/R/G Lungs:  Distant BS Abdomen:  Soft, non tender, bowel sounds present Extremities:  Bowel sounds present Basic Metabolic Panel:  Lab 07/20/11 4132 07/19/11 0330 07/18/11 1355  NA 141 140 140  K 4.4 3.6 --  CL 105 102 104  CO2 27 25 26     GLUCOSE 141* 153* 144*  BUN 38* 17 19  CREATININE 0.80 0.61 0.67  CALCIUM 9.3 9.6 9.3  MG -- -- --  PHOS -- -- --   Liver Function Tests:  Lab 07/19/11 0330  AST 16  ALT 12  ALKPHOS 102  BILITOT 0.2*  PROT 7.4  ALBUMIN 4.0   No results found for this basename: LIPASE:5,AMYLASE:5 in the last 168 hours No results found for this basename: AMMONIA:5 in the last 168 hours CBC:  Lab 07/19/11 0330 07/18/11 1355  WBC 4.6 6.2  NEUTROABS -- 4.5  HGB 14.2 14.0  HCT 42.8 42.4  MCV 89.0 88.7  PLT 257 238   Cardiac Enzymes: No results found for this basename: CKTOTAL:5,CKMB:5,CKMBINDEX:5,TROPONINI:5 in the last 168 hours BNP: No components found with this basename: POCBNP:5 CBG: No results found for this basename: GLUCAP:5 in the last 168 hours Other Labs:  Imaging: COPD changes on recent CXR   Patient Active Hospital Problem List: Acute-on-chronic respiratory failure ()   Assessment: improved   Plan: ok for d/c today  COPD (chronic obstructive pulmonary disease) ()   Assessment: Imiproved, golds III, O2 dependent   Plan: Ok to d/c on albut/atrovent neb med plus advair, pred taper and ABX with oxygen  F/u with Dr Delford Field Feb 13 Wed at 4pm   Shan Levans Beeper  [phone]  Cell  [phone]  If no response  or cell goes to voicemail, call beeper [phone]  07/22/2011, 12:03 PM

## 2011-07-22 NOTE — Progress Notes (Signed)
Spoke with patient and husband at bedside. Hopeful for d/c home today. States she has home O2 arranged previously with [employer]. Normally wears O2 prn but for the past several weeks has needed it full time. States her nebulizer meds were made generic and that is when she felt her decline started. The generic change was a result of a change in health insurance and was initiated by the provider. States she hopes Dr. Delford Field who follows her OP will be able to get her back on her non-generic meds. States she gets around reasonably well but unable to walk for long distances, request a w/c to assist. Discussed with attending MD and awaiting order. Will continue to follow for d/c needs.

## 2011-07-22 NOTE — Discharge Summary (Signed)
Physician Discharge Summary  Nema Oatley Golaszewski MRN: [id number] DOB/AGE: [birth year] 76 y.o.  PCP: [name], MD, MD   Admit date: 07/18/2011 Discharge date: 07/22/2011  Discharge Diagnoses:    *Acute-on-chronic respiratory failure due to COPD exacerbation   COPD (chronic obstructive pulmonary disease)  GERD (gastroesophageal reflux disease)  HTN (hypertension)  Hyperlipemia   Medication List  As of 07/22/2011  2:06 PM   STOP taking these medications         ipratropium-albuterol 0.5-2.5 (3) MG/3ML Soln      PROVENTIL (2.5 MG/3ML) 0.083% nebulizer solution         TAKE these medications         ADVAIR DISKUS 250-50 MCG/DOSE Aepb   Generic drug: Fluticasone-Salmeterol   Inhale 1 puff into the lungs daily.      albuterol (5 MG/ML) 0.5% nebulizer solution   Commonly known as: PROVENTIL   Take 0.5 mLs (2.5 mg total) by nebulization every 2 (two) hours as needed for wheezing.      ALPRAZolam 0.5 MG tablet   Commonly known as: XANAX   Take 1 tablet (0.5 mg total) by mouth at bedtime as needed.      cefUROXime 500 MG tablet   Commonly known as: CEFTIN   Take 1 tablet (500 mg total) by mouth 2 (two) times daily.      chlorpheniramine-HYDROcodone 10-8 MG/5ML Lqcr   Commonly known as: TUSSIONEX   Take 5 mLs by mouth every 12 (twelve) hours as needed.      doxycycline 100 MG tablet   Commonly known as: VIBRA-TABS   Take 1 tablet (100 mg total) by mouth 2 (two) times daily.      esomeprazole 40 MG capsule   Commonly known as: NEXIUM   Take 1 capsule (40 mg total) by mouth daily.      GARLIQUE 400 MG Tbec   Generic drug: Garlic   Take 1 tablet by mouth daily.      GOODY HEADACHE PO   Take 1 packet by mouth 2 (two) times daily as needed. For pain.      halobetasol 0.05 % cream   Commonly known as: ULTRAVATE   Apply 1 application topically daily as needed.      ipratropium 0.02 % nebulizer solution   Commonly known as: ATROVENT   Take 2.5 mLs (0.5 mg total) by  nebulization 3 (three) times daily.      losartan 100 MG tablet   Commonly known as: COZAAR   TAKE 1 TABLET (100 MG TOTAL) BY MOUTH DAILY.      predniSONE 20 MG tablet   Commonly known as: DELTASONE   Take 2 tablets (40 mg total) by mouth daily.      rosuvastatin 10 MG tablet   Commonly known as: CRESTOR   Take 10 mg by mouth daily.            Discharge Condition: Stable  Disposition: Home or Self Care   Consults: Shan Levans, MD   Significant Diagnostic Studies: Dg Chest Port 1 View  07/18/2011  *RADIOLOGY REPORT*  Clinical Data: Shortness of breath.  PORTABLE CHEST - 1 VIEW  Comparison: 11/01/2008.  Findings: 1342 hours. Hyperexpansion is consistent with emphysema. Interstitial markings are diffusely coarsened with chronic features. The cardiopericardial silhouette is within normal limits for size. The lungs are clear without focal infiltrate, edema, pneumothorax or pleural effusion. Telemetry leads overlie the chest.  IMPRESSION: Emphysema with underlying chronic interstitial changes.  No acute cardiopulmonary  process.  Original Report Authenticated By: ERIC A. MANSELL, M.D.    Microbiology: No results found for this or any previous visit (from the past 240 hour(s)).   Labs: No results found for this or any previous visit (from the past 48 hour(s)).   HPI :78 y.o. female with a PMH of chronic respiratory failure and COPD who presents to the hospital with progressive SOB, feeling "smothered" for the past 2 weeks. Patient reports that she has been unable to do her usual house chores due to shortness of breath and fatigue. She usually uses her oxygen intermittently, but over the past 2 weeks, has begun to use it more continuously. She reports a cough, productive of thick white mucous. No frank fever or chills. Husband was recently seen and treated for a viral URI. She has had her flu vaccination this year. Upon initial evaluation in the emergency department, the patient was  found to be hypoxic and with respiratory distress and therefore she was put on CPAP. She improved and ultimately, the CPAP was weaned off. She subsequently was referred to the hospitalist service for further evaluation and treatment.     HOSPITAL COURSE: 1. Acute-on-chronic respiratory failure- chest x-ray upon admission showed Emphysema with underlying chronic interstitial changes. No acute cardiopulmonary process. She  has been weaned of bipap, she was treated with IV antibiotics namely Rocephin, she is allergic to erythromycin as well as Avelox. She is on home dose of 2L O2, however at home she uses it on an intermittent basis. In the hospital she complained of dyspnea with movement, she has been switched to by mouth doxycycline and by mouth cefuroxime,per pulmonology recommendations, I have given her prescriptions far albuterol as well as Atrovent nebulizers. She will continue with by mouth steroids for another 7 days. She will followup with Dr. Delford Field 2. COPD (chronic obstructive pulmonary disease)- patient has outpt pulmonologist who she will need to follow up with  Dr Delford Field Feb 13 Wed at 4pm     3. GERD (gastroesophageal reflux disease) stable 4. HTN (hypertension)- add PRN hydralazine as patients BP has been elevated for last few checks, cont PO cozaar  5. Hyperlipemia- continue statin  6. Dispo: She has been set up for continuous home oxygen at 2 L minute She will be provided with a prescription for a regular wheelchair     Discharge Exam:  Blood pressure 169/81, pulse 96, temperature 98.2 F (36.8 C), temperature source Oral, resp. rate 20, height 5' (1.524 m), weight 59.966 kg (132 lb 3.2 oz), SpO2 88.00%.   Physical Examination:  Neuro: awake and alert  HEENT: Dry mucus membranes  Heart: RRR, no M/R/G  Lungs: Distant BS  Abdomen: Soft, non tender, bowel sounds present  Extremities: Bowel sounds present   Discharge Orders    Future Appointments: Provider: Department:  Dept Phone: Center:   07/30/2011 4:00 PM Shan Levans, MD [facility] [phone] None   02/24/2012 9:00 AM [facility] [phone] [facility]     Future Orders Please Complete By Expires   For home use only DME oxygen      Comments:   2 l continuous per    DME Wheelchair electric      Comments:   Regular wheelchair   Diet - low sodium heart healthy      Increase activity slowly      Call MD for:  temperature >100.4      Call MD for:  persistant nausea and vomiting  Call MD for:  difficulty breathing, headache or visual disturbances         Follow-up Information    Follow up with [name], MD .         Signed: Richarda Overlie 07/22/2011, 2:06 PM

## 2011-07-30 ENCOUNTER — Ambulatory Visit (INDEPENDENT_AMBULATORY_CARE_PROVIDER_SITE_OTHER): Payer: Medicare HMO | Admitting: Critical Care Medicine

## 2011-07-30 ENCOUNTER — Encounter: Payer: Self-pay | Admitting: Critical Care Medicine

## 2011-07-30 DIAGNOSIS — J449 Chronic obstructive pulmonary disease, unspecified: Secondary | ICD-10-CM

## 2011-07-30 MED ORDER — DOXYCYCLINE HYCLATE 100 MG PO CAPS: 100.0000 mg | ORAL_CAPSULE | Freq: Two times a day (BID) | ORAL | Status: AC

## 2011-07-30 MED ORDER — POTASSIUM CHLORIDE ER 10 MEQ PO CPCR: 10.0000 meq | ORAL_CAPSULE | Freq: Every day | ORAL | Status: DC

## 2011-07-30 MED ORDER — ALBUTEROL SULFATE (2.5 MG/3ML) 0.083% IN NEBU: 2.5000 mg | INHALATION_SOLUTION | Freq: Four times a day (QID) | RESPIRATORY_TRACT | Status: DC | PRN

## 2011-07-30 MED ORDER — IPRATROPIUM BROMIDE 0.02 % IN SOLN: 500.0000 ug | Freq: Four times a day (QID) | RESPIRATORY_TRACT | Status: DC

## 2011-07-30 MED ORDER — PREDNISONE 10 MG PO TABS: ORAL_TABLET | ORAL | Status: DC

## 2011-07-30 MED ORDER — FUROSEMIDE 20 MG PO TABS: 20.0000 mg | ORAL_TABLET | Freq: Every day | ORAL | Status: DC

## 2011-07-30 NOTE — Patient Instructions (Signed)
Prednisone 10mg  Take 4 for three days 3 for three days 2 for three days 1 for three days and stop Doxycycline one twice daily for 7days When Duoneb runs out , we sent refill for albuterol and atrovent in nebulizer 4 times daily Take lasix and potassium one time daily for 10days then stop STay on oxygen full time Return 2 weeks for recheck with Tammy Parrett

## 2011-07-30 NOTE — Progress Notes (Signed)
Subjective:    Patient ID: [name], female    DOB: 1930-08-08, 78 y.o.   MRN: [id number]  HPI 78 y.o. WF Copd IV Golds.  Oxygen dependent.  07/30/2011 Not seen since 06/2009 Pt in hosp for copd exac 2/1- 07/22/11 Just finished pred/doxy.  Now is worse , feels like has more fluid on abdomen. Feels like wants to cut breath off.  Notes some edema in feet.  No cough .  No real mucus.  Notes white thick mucus.    Past Medical History  Diagnosis Date  . History of colonic polyps   . COPD (chronic obstructive pulmonary disease)   . Diverticulitis of colon   . GERD (gastroesophageal reflux disease)   . HTN (hypertension)   . Osteoarthritis   . Hyperlipemia   . Aortic aneurysm, abdominal   . Ovarian cyst, left   . Psoriasis   . Chronic respiratory failure     Uses home oxygen at 2 L  . ADRENAL MASS 05/04/2007  . GLAUCOMA 03/30/2007  . HEMORRHOIDS, INTERNAL 03/23/2008  . OSTEOPOROSIS 03/30/2007  . SCOLIOSIS 03/30/2007  . PONV (postoperative nausea and vomiting)      Family History  Problem Relation Age of Onset  . Heart attack Mother   . Heart attack Father   . Deep vein thrombosis Father   . Cancer Brother     Pancreas and colon     History   Social History  . Marital Status: Married    Spouse Name: Richard    Number of Children: 2  . Years of Education: 11   Occupational History  . [employer]   . Factory work    Social History Main Topics  . Smoking status: Former Smoker -- 0.5 packs/day for 40 years    Types: Cigarettes    Quit date: 07/17/2008  . Smokeless tobacco: Never Used  . Alcohol Use: No  . Drug Use: No  . Sexually Active: No   Other Topics Concern  . Not on file   Social History Narrative   Married.  Lives in [place] with her husband.  Normally independent of ADLs and ambulation.     Allergies  Allergen Reactions  . Clarithromycin     REACTION: abd pain  . Cleartuss Dh     REACTION: feels funny  . Iohexol      Desc: pt reports dyspnea  and throat swelling about age 40 w/ IV contrast reaction when kidneys were being checked; 07/25/08 SLG   . Moxifloxacin     REACTION: hallucinations  . Sulfonamide Derivatives     REACTION: unknown     Outpatient Prescriptions Prior to Visit  Medication Sig Dispense Refill  . Aspirin-Acetaminophen-Caffeine (GOODY HEADACHE PO) Take 1 packet by mouth 2 (two) times daily as needed. For pain.      . chlorpheniramine-HYDROcodone (TUSSIONEX) 10-8 MG/5ML LQCR Take 5 mLs by mouth every 12 (twelve) hours as needed.  250 mL  1  . esomeprazole (NEXIUM) 40 MG capsule Take 1 capsule (40 mg total) by mouth daily.  30 capsule  12  . Garlic (GARLIQUE) 400 MG TBEC Take 1 tablet by mouth daily.        . halobetasol (ULTRAVATE) 0.05 % cream Apply 1 application topically daily as needed.       [name] losartan (COZAAR) 100 MG tablet TAKE 1 TABLET (100 MG TOTAL) BY MOUTH DAILY.  30 tablet  6  . rosuvastatin (CRESTOR) 10 MG tablet Take 10 mg by mouth daily.        [name]  ALPRAZolam (XANAX) 0.5 MG tablet Take 1 tablet (0.5 mg total) by mouth at bedtime as needed.  90 tablet  0  . albuterol (PROVENTIL) (5 MG/ML) 0.5% nebulizer solution Take 0.5 mLs (2.5 mg total) by nebulization every 2 (two) hours as needed for wheezing.  20 mL  0  . Fluticasone-Salmeterol (ADVAIR DISKUS) 250-50 MCG/DOSE AEPB Inhale 1 puff into the lungs daily.        [name] ipratropium (ATROVENT) 0.02 % nebulizer solution Take 2.5 mLs (0.5 mg total) by nebulization 3 (three) times daily.  75 mL  2      Review of Systems Constitutional:   No  weight loss, night sweats,  Fevers, chills, fatigue, lassitude. HEENT:   No headaches,  Difficulty swallowing,  Tooth/dental problems,  Sore throat,                No sneezing, itching, ear ache, nasal congestion, post nasal drip,   CV:  No chest pain,  Orthopnea, PND, swelling in lower extremities, anasarca, dizziness, palpitations  GI  No heartburn, indigestion, abdominal pain, nausea, vomiting, diarrhea, change in  bowel habits, loss of appetite  Resp: Notes  shortness of breath with exertion and  at rest.  Notes  excess mucus, notes  productive cough,  No non-productive cough,  No coughing up of blood.  No change in color of mucus.  No wheezing.  No chest wall deformity  Skin: no rash or lesions.  GU: no dysuria, change in color of urine, no urgency or frequency.  No flank pain.  MS:  No joint pain or swelling.  No decreased range of motion.  No back pain.  Psych:  No change in mood or affect. No depression or anxiety.  No memory loss.     Objective:   Physical Exam Filed Vitals:   07/30/11 1557  BP: 142/80  Pulse: 79  Temp: 98.2 F (36.8 C)  TempSrc: Oral  Height: 5' (1.524 m)  Weight: 138 lb 12.8 oz (62.959 kg)  SpO2: 95%    Gen: Pleasant, well-nourished, in no distress,  normal affect  ENT: No lesions,  mouth clear,  oropharynx clear, no postnasal drip  Neck: No JVD, no TMG, no carotid bruits  Lungs: No use of accessory muscles, no dullness to percussion, distant BS, poor airflow  Cardiovascular: RRR, heart sounds normal, no murmur or gallops, no peripheral edema  Abdomen: soft and NT, no HSM,  BS normal  Musculoskeletal: No deformities, no cyanosis or clubbing  Neuro: alert, non focal  Skin: Warm, no lesions or rashes          Assessment & Plan:   COPD (chronic obstructive pulmonary disease) Copd with recurrent exacerbations Plan Prednisone 10mg  Take 4 for three days 3 for three days 2 for three days 1 for three days and stop Doxycycline one twice daily for 7days When Duoneb runs out , we sent refill for albuterol and atrovent in nebulizer 4 times daily Take lasix and potassium one time daily for 10days then stop STay on oxygen full time Return 2 weeks for recheck with Tammy Parrett     Updated Medication List Outpatient Encounter Prescriptions as of 07/30/2011  Medication Sig Dispense Refill  . Aspirin-Acetaminophen-Caffeine (GOODY HEADACHE PO) Take 1  packet by mouth 2 (two) times daily as needed. For pain.      . chlorpheniramine-HYDROcodone (TUSSIONEX) 10-8 MG/5ML LQCR Take 5 mLs by mouth every 12 (twelve) hours as needed.  250 mL  1  . esomeprazole (NEXIUM) 40  MG capsule Take 1 capsule (40 mg total) by mouth daily.  30 capsule  12  . Garlic (GARLIQUE) 400 MG TBEC Take 1 tablet by mouth daily.        . halobetasol (ULTRAVATE) 0.05 % cream Apply 1 application topically daily as needed.       [name] losartan (COZAAR) 100 MG tablet TAKE 1 TABLET (100 MG TOTAL) BY MOUTH DAILY.  30 tablet  6  . rosuvastatin (CRESTOR) 10 MG tablet Take 10 mg by mouth daily.        [name] DISCONTD: ipratropium-albuterol (DUONEB) 0.5-2.5 (3) MG/3ML SOLN Take 1 vial by nebulization 4 times daily.      [name] albuterol (PROVENTIL) (2.5 MG/3ML) 0.083% nebulizer solution Take 3 mLs (2.5 mg total) by nebulization every 6 (six) hours as needed for wheezing.  360 mL  6  . ALPRAZolam (XANAX) 0.5 MG tablet Take 1 tablet (0.5 mg total) by mouth at bedtime as needed.  90 tablet  0  . doxycycline (VIBRAMYCIN) 100 MG capsule Take 1 capsule (100 mg total) by mouth 2 (two) times daily.  14 capsule  0  . furosemide (LASIX) 20 MG tablet Take 1 tablet (20 mg total) by mouth daily.  10 tablet  0  . ipratropium (ATROVENT) 0.02 % nebulizer solution Take 2.5 mLs (500 mcg total) by nebulization 4 (four) times daily.  360 mL  6  . potassium chloride (MICRO-K) 10 MEQ CR capsule Take 1 capsule (10 mEq total) by mouth daily.  10 capsule  0  . predniSONE (DELTASONE) 10 MG tablet Take 4 for three days 3 for three days 2 for three days 1 for three days and stop  30 tablet  0  . DISCONTD: albuterol (PROVENTIL) (5 MG/ML) 0.5% nebulizer solution Take 0.5 mLs (2.5 mg total) by nebulization every 2 (two) hours as needed for wheezing.  20 mL  0  . DISCONTD: Fluticasone-Salmeterol (ADVAIR DISKUS) 250-50 MCG/DOSE AEPB Inhale 1 puff into the lungs daily.        [name] DISCONTD: ipratropium (ATROVENT) 0.02 % nebulizer solution  Take 2.5 mLs (0.5 mg total) by nebulization 3 (three) times daily.  75 mL  2

## 2011-08-01 NOTE — Assessment & Plan Note (Signed)
Copd with recurrent exacerbations Plan Prednisone 10mg  Take 4 for three days 3 for three days 2 for three days 1 for three days and stop Doxycycline one twice daily for 7days When Duoneb runs out , we sent refill for albuterol and atrovent in nebulizer 4 times daily Take lasix and potassium one time daily for 10days then stop STay on oxygen full time Return 2 weeks for recheck with Tammy Parrett

## 2011-08-08 ENCOUNTER — Other Ambulatory Visit: Payer: Self-pay

## 2011-08-08 MED ORDER — ALPRAZOLAM 0.5 MG PO TABS: 0.5000 mg | ORAL_TABLET | Freq: Every evening | ORAL | Status: DC | PRN

## 2011-08-08 NOTE — Telephone Encounter (Signed)
Ok to refill 

## 2011-08-08 NOTE — Telephone Encounter (Signed)
Received fax refill request from patients pharmacy.  Okay to refill? 

## 2011-08-11 NOTE — Telephone Encounter (Signed)
Rx called in as directed.   

## 2011-08-13 ENCOUNTER — Telehealth: Payer: Self-pay | Admitting: Adult Health

## 2011-08-13 ENCOUNTER — Encounter: Payer: Self-pay | Admitting: Adult Health

## 2011-08-13 ENCOUNTER — Ambulatory Visit (INDEPENDENT_AMBULATORY_CARE_PROVIDER_SITE_OTHER): Payer: Medicare HMO | Admitting: Adult Health

## 2011-08-13 VITALS — BP 160/88 | HR 83 | Temp 97.0°F | Ht 60.0 in | Wt 135.6 lb

## 2011-08-13 DIAGNOSIS — J449 Chronic obstructive pulmonary disease, unspecified: Secondary | ICD-10-CM

## 2011-08-13 MED ORDER — BUDESONIDE 0.25 MG/2ML IN SUSP: 0.2500 mg | Freq: Two times a day (BID) | RESPIRATORY_TRACT | Status: DC

## 2011-08-13 NOTE — Telephone Encounter (Signed)
This is a PW patient-TP is out of office this afternoon so I will send this message for advice to PW.

## 2011-08-13 NOTE — Patient Instructions (Signed)
Add Budesonide Neb Twice Twice daily   Continue on Albuterol and Atrovent Neb Four times a day   Mucinex DM Twice daily  As needed  Cough/congestion  Follow up Dr. Delford Field  In 4-6 weeks and As needed   Please contact office for sooner follow up if symptoms do not improve or worsen or seek emergency care

## 2011-08-13 NOTE — Progress Notes (Signed)
Subjective:    Patient ID: [name], female    DOB: 1930/12/07, 78 y.o.   MRN: [id number]  HPI  78 y.o. WF Copd IV Golds.  Oxygen dependent.  07/30/2011 Not seen since 06/2009 Pt in hosp for copd exac 2/1- 07/22/11 Just finished pred/doxy.  Now is worse , feels like has more fluid on abdomen. Feels like wants to cut breath off.  Notes some edema in feet.  No cough .  No real mucus.  Notes white thick mucus.   >>Doxycycline and steroid taper   08/13/2011 Follow up  Returns for follow up . She is feeling only minimal improvement. Says she had a bad reaction to prednisone with stomach bloating. Has decreased congestion. [place] short of breath with minimal activity. Taking atrovent and albuterol Four times a day   [place]wiler feels weak with no energy.  No hemotpysis , chest pain or edema.   Past Medical History  Diagnosis Date  . History of colonic polyps   . COPD (chronic obstructive pulmonary disease)   . Diverticulitis of colon   . GERD (gastroesophageal reflux disease)   . HTN (hypertension)   . Osteoarthritis   . Hyperlipemia   . Aortic aneurysm, abdominal   . Ovarian cyst, left   . Psoriasis   . Chronic respiratory failure     Uses home oxygen at 2 L  . ADRENAL MASS 05/04/2007  . GLAUCOMA 03/30/2007  . HEMORRHOIDS, INTERNAL 03/23/2008  . OSTEOPOROSIS 03/30/2007  . SCOLIOSIS 03/30/2007  . PONV (postoperative nausea and vomiting)      Family History  Problem Relation Age of Onset  . Heart attack Mother   . Heart attack Father   . Deep vein thrombosis Father   . Cancer Brother     Pancreas and colon     History   Social History  . Marital Status: Married    Spouse Name: Richard    Number of Children: 2  . Years of Education: 11   Occupational History  . [employer]   . Factory work    Social History Main Topics  . Smoking status: Former Smoker -- 0.5 packs/day for 40 years    Types: Cigarettes    Quit date: 07/17/2008  . Smokeless tobacco: Never Used  .  Alcohol Use: No  . Drug Use: No  . Sexually Active: No   Other Topics Concern  . Not on file   Social History Narrative   Married.  Lives in [place] with her husband.  Normally independent of ADLs and ambulation.     Allergies  Allergen Reactions  . Clarithromycin     REACTION: abd pain  . Cleartuss Dh     REACTION: feels funny  . Iohexol      Desc: pt reports dyspnea and throat swelling about age 55 w/ IV contrast reaction when kidneys were being checked; 07/25/08 SLG   . Moxifloxacin     REACTION: hallucinations  . Prednisone   . Sulfonamide Derivatives     REACTION: unknown     Outpatient Prescriptions Prior to Visit  Medication Sig Dispense Refill  . albuterol (PROVENTIL) (2.5 MG/3ML) 0.083% nebulizer solution Take 3 mLs (2.5 mg total) by nebulization every 6 (six) hours as needed for wheezing.  360 mL  6  . ALPRAZolam (XANAX) 0.5 MG tablet Take 1 tablet (0.5 mg total) by mouth at bedtime as needed.  90 tablet  0  . Aspirin-Acetaminophen-Caffeine (GOODY HEADACHE PO) Take 1 packet by mouth 2 (two)  times daily as needed. For pain.      . chlorpheniramine-HYDROcodone (TUSSIONEX) 10-8 MG/5ML LQCR Take 5 mLs by mouth every 12 (twelve) hours as needed.  250 mL  1  . esomeprazole (NEXIUM) 40 MG capsule Take 1 capsule (40 mg total) by mouth daily.  30 capsule  12  . furosemide (LASIX) 20 MG tablet Take 1 tablet (20 mg total) by mouth daily.  10 tablet  0  . Garlic (GARLIQUE) 400 MG TBEC Take 1 tablet by mouth daily.        . halobetasol (ULTRAVATE) 0.05 % cream Apply 1 application topically daily as needed.       [name] ipratropium (ATROVENT) 0.02 % nebulizer solution Take 2.5 mLs (500 mcg total) by nebulization 4 (four) times daily.  360 mL  6  . losartan (COZAAR) 100 MG tablet TAKE 1 TABLET (100 MG TOTAL) BY MOUTH DAILY.  30 tablet  6  . potassium chloride (MICRO-K) 10 MEQ CR capsule Take 1 capsule (10 mEq total) by mouth daily.  10 capsule  0  . rosuvastatin (CRESTOR) 10 MG tablet  Take 10 mg by mouth daily.        . predniSONE (DELTASONE) 10 MG tablet Take 4 for three days 3 for three days 2 for three days 1 for three days and stop  30 tablet  0      Review of Systems  Constitutional:   No  weight loss, night sweats,  Fevers, chills,  +fatigue, lassitude. HEENT:   No headaches,  Difficulty swallowing,  Tooth/dental problems,  Sore throat,                No sneezing, itching, ear ache, + nasal congestion, post nasal drip,   CV:  No chest pain,  Orthopnea, PND, swelling in lower extremities, anasarca, dizziness, palpitations  GI  No heartburn, indigestion, abdominal pain, nausea, vomiting, diarrhea, change in bowel habits, loss of appetite  Resp: No coughing up of blood.    No chest wall deformity  Skin: no rash or lesions.  GU: no dysuria, change in color of urine, no urgency or frequency.  No flank pain.  MS:  No joint pain or swelling.  No decreased range of motion.  No back pain.  Psych:  No change in mood or affect. No depression or anxiety.  No memory loss.     Objective:   Physical Exam  Filed Vitals:   08/13/11 1055  BP: 160/88  Pulse: 83  Temp: 97 F (36.1 C)  TempSrc: Oral  Height: 5' (1.524 m)  Weight: 135 lb 9.6 oz (61.508 kg)  SpO2: 99%    Gen: Pleasant, well-nourished, in no distress,  normal affect  ENT: No lesions,  mouth clear,  oropharynx clear, no postnasal drip  Neck: No JVD, no TMG, no carotid bruits  Lungs: No use of accessory muscles, no dullness to percussion, distant BS  Cardiovascular: RRR, heart sounds normal, no murmur or gallops, no peripheral edema  Abdomen: soft and NT, no HSM,  BS normal  Musculoskeletal: No deformities, no cyanosis or clubbing  Neuro: alert, non focal  Skin: Warm, no lesions or rashes          Assessment & Plan:   No problem-specific assessment & plan notes found for this encounter.   Updated Medication List Outpatient Encounter Prescriptions as of 08/13/2011  Medication  Sig Dispense Refill  . albuterol (PROVENTIL) (2.5 MG/3ML) 0.083% nebulizer solution Take 3 mLs (2.5 mg total) by nebulization every  6 (six) hours as needed for wheezing.  360 mL  6  . ALPRAZolam (XANAX) 0.5 MG tablet Take 1 tablet (0.5 mg total) by mouth at bedtime as needed.  90 tablet  0  . Aspirin-Acetaminophen-Caffeine (GOODY HEADACHE PO) Take 1 packet by mouth 2 (two) times daily as needed. For pain.      . chlorpheniramine-HYDROcodone (TUSSIONEX) 10-8 MG/5ML LQCR Take 5 mLs by mouth every 12 (twelve) hours as needed.  250 mL  1  . esomeprazole (NEXIUM) 40 MG capsule Take 1 capsule (40 mg total) by mouth daily.  30 capsule  12  . furosemide (LASIX) 20 MG tablet Take 1 tablet (20 mg total) by mouth daily.  10 tablet  0  . Garlic (GARLIQUE) 400 MG TBEC Take 1 tablet by mouth daily.        . halobetasol (ULTRAVATE) 0.05 % cream Apply 1 application topically daily as needed.       [name] ipratropium (ATROVENT) 0.02 % nebulizer solution Take 2.5 mLs (500 mcg total) by nebulization 4 (four) times daily.  360 mL  6  . losartan (COZAAR) 100 MG tablet TAKE 1 TABLET (100 MG TOTAL) BY MOUTH DAILY.  30 tablet  6  . potassium chloride (MICRO-K) 10 MEQ CR capsule Take 1 capsule (10 mEq total) by mouth daily.  10 capsule  0  . rosuvastatin (CRESTOR) 10 MG tablet Take 10 mg by mouth daily.        . predniSONE (DELTASONE) 10 MG tablet Take 4 for three days 3 for three days 2 for three days 1 for three days and stop  30 tablet  0

## 2011-08-13 NOTE — Assessment & Plan Note (Signed)
Recent exacerbation  Slowly resolving  Will add Budesonide to  Her regimen Twice daily   Cont on atrovent and albuterol neb .Four times a day   Please contact office for sooner follow up if symptoms do not improve or worsen or seek emergency care  follow up in 4 weeks with Dr. Delford Field

## 2011-08-14 NOTE — Telephone Encounter (Signed)
Spoke with Okey Regal at [facility] and informed per PW ok for pt to D/C Budesonide for now.  She will contact pt to let her know.

## 2011-08-14 NOTE — Telephone Encounter (Signed)
i am ok with d/c'ing the budesonide for now

## 2011-08-18 ENCOUNTER — Ambulatory Visit (INDEPENDENT_AMBULATORY_CARE_PROVIDER_SITE_OTHER): Payer: Medicare HMO | Admitting: Family Medicine

## 2011-08-18 ENCOUNTER — Encounter: Payer: Self-pay | Admitting: Family Medicine

## 2011-08-18 VITALS — BP 158/80 | HR 68 | Temp 98.0°F | Wt 136.0 lb

## 2011-08-18 DIAGNOSIS — I10 Essential (primary) hypertension: Secondary | ICD-10-CM

## 2011-08-18 DIAGNOSIS — J449 Chronic obstructive pulmonary disease, unspecified: Secondary | ICD-10-CM

## 2011-08-18 LAB — BASIC METABOLIC PANEL
BUN: 17 mg/dL (ref 6–23)
Chloride: 109 mEq/L (ref 96–112)
Glucose, Bld: 81 mg/dL (ref 70–99)
Potassium: 5 mEq/L (ref 3.5–5.1)

## 2011-08-18 MED ORDER — AMLODIPINE BESYLATE 5 MG PO TABS: 5.0000 mg | ORAL_TABLET | Freq: Every day | ORAL | Status: DC

## 2011-08-18 MED ORDER — ROSUVASTATIN CALCIUM 10 MG PO TABS: 10.0000 mg | ORAL_TABLET | Freq: Every day | ORAL | Status: DC

## 2011-08-18 MED ORDER — HYDROCODONE-ACETAMINOPHEN 5-500 MG PO TABS: 1.0000 | ORAL_TABLET | Freq: Four times a day (QID) | ORAL | Status: AC | PRN

## 2011-08-18 MED ORDER — LOSARTAN POTASSIUM 100 MG PO TABS: 100.0000 mg | ORAL_TABLET | Freq: Every day | ORAL | Status: DC

## 2011-08-18 NOTE — Progress Notes (Signed)
Subjective:    Patient ID: [name], female    DOB: Aug 08, 1930, 78 y.o.   MRN: [id number]  HPI  78 yo with h/o COPD IV, O2 dependent here for hospital follow up.    Notes reviewed. Pt in hosp for copd exac 2/1- 07/22/11 Followed by Dr. Delford Field, recently saw Tammy on 2/27.  Now using her O2 regularly and says she [place] feels bad.   Reports a bad reaction to prednisone with stomach bloating although she has been on and off pred for years. [place] short of breath with minimal activity. [place] feels weak with no energy.    HTN- BP very elevated today. BP Readings from Last 3 Encounters:  08/18/11 180/100  08/13/11 160/88  07/30/11 142/80   Reports taking her Losartan 100 mg today. Was on short course of lasix while in hospital. She is very resistant to starting HCTZ because it "caused sister's kidney problems." No HA or blurred vision.  Past Medical History  Diagnosis Date  . History of colonic polyps   . COPD (chronic obstructive pulmonary disease)   . Diverticulitis of colon   . GERD (gastroesophageal reflux disease)   . HTN (hypertension)   . Osteoarthritis   . Hyperlipemia   . Aortic aneurysm, abdominal   . Ovarian cyst, left   . Psoriasis   . Chronic respiratory failure     Uses home oxygen at 2 L  . ADRENAL MASS 05/04/2007  . GLAUCOMA 03/30/2007  . HEMORRHOIDS, INTERNAL 03/23/2008  . OSTEOPOROSIS 03/30/2007  . SCOLIOSIS 03/30/2007  . PONV (postoperative nausea and vomiting)      Family History  Problem Relation Age of Onset  . Heart attack Mother   . Heart attack Father   . Deep vein thrombosis Father   . Cancer Brother     Pancreas and colon     History   Social History  . Marital Status: Married    Spouse Name: Richard    Number of Children: 2  . Years of Education: 11   Occupational History  . [employer]   . Factory work    Social History Main Topics  . Smoking status: Former Smoker -- 0.5 packs/day for 40 years    Types: Cigarettes    Quit  date: 07/17/2008  . Smokeless tobacco: Never Used  . Alcohol Use: No  . Drug Use: No  . Sexually Active: No   Other Topics Concern  . Not on file   Social History Narrative   Married.  Lives in [place] with her husband.  Normally independent of ADLs and ambulation.     Allergies  Allergen Reactions  . Clarithromycin     REACTION: abd pain  . Cleartuss Dh     REACTION: feels funny  . Iohexol      Desc: pt reports dyspnea and throat swelling about age 45 w/ IV contrast reaction when kidneys were being checked; 07/25/08 SLG   . Moxifloxacin     REACTION: hallucinations  . Prednisone   . Sulfonamide Derivatives     REACTION: unknown     Outpatient Prescriptions Prior to Visit  Medication Sig Dispense Refill  . albuterol (PROVENTIL) (2.5 MG/3ML) 0.083% nebulizer solution Take 3 mLs (2.5 mg total) by nebulization every 6 (six) hours as needed for wheezing.  360 mL  6  . ALPRAZolam (XANAX) 0.5 MG tablet Take 1 tablet (0.5 mg total) by mouth at bedtime as needed.  90 tablet  0  . Aspirin-Acetaminophen-Caffeine (GOODY HEADACHE PO)  Take 1 packet by mouth 2 (two) times daily as needed. For pain.      . budesonide (PULMICORT) 0.25 MG/2ML nebulizer solution Take 2 mLs (0.25 mg total) by nebulization 2 (two) times daily.  60 mL  11  . esomeprazole (NEXIUM) 40 MG capsule Take 1 capsule (40 mg total) by mouth daily.  30 capsule  12  . furosemide (LASIX) 20 MG tablet Take 1 tablet (20 mg total) by mouth daily.  10 tablet  0  . Garlic (GARLIQUE) 400 MG TBEC Take 1 tablet by mouth daily.        . halobetasol (ULTRAVATE) 0.05 % cream Apply 1 application topically daily as needed.       [name] ipratropium (ATROVENT) 0.02 % nebulizer solution Take 2.5 mLs (500 mcg total) by nebulization 4 (four) times daily.  360 mL  6  . potassium chloride (MICRO-K) 10 MEQ CR capsule Take 1 capsule (10 mEq total) by mouth daily.  10 capsule  0  . losartan (COZAAR) 100 MG tablet TAKE 1 TABLET (100 MG TOTAL) BY MOUTH  DAILY.  30 tablet  6  . rosuvastatin (CRESTOR) 10 MG tablet Take 10 mg by mouth daily.        . chlorpheniramine-HYDROcodone (TUSSIONEX) 10-8 MG/5ML LQCR Take 5 mLs by mouth every 12 (twelve) hours as needed.  250 mL  1  . predniSONE (DELTASONE) 10 MG tablet Take 4 for three days 3 for three days 2 for three days 1 for three days and stop  30 tablet  0      Review of Systems  See HPI Feels tired. No HA, blurred vision      Objective:   Physical Exam  Filed Vitals:   08/18/11 1204  BP: 180/100  Pulse: 68  Temp: 98 F (36.7 C)  TempSrc: Oral  Weight: 136 lb (61.689 kg)   BP: 158/80 mmHg    Gen: Pleasant, well-nourished, in no distress,  normal affect  ENT: No lesions,  mouth clear,  oropharynx clear, no postnasal drip  Neck: No JVD, no TMG, no carotid bruits  Lungs: No use of accessory muscles, no dullness to percussion, distant BS  Cardiovascular: RRR, heart sounds normal, no murmur or gallops, no peripheral edema   Musculoskeletal: No deformities, no cyanosis or clubbing  Neuro: alert, non focal  Skin: Warm, no lesions or rashes      Assessment & Plan:   1. HTN (hypertension)  Deteriorated. Improved after she rested for a few minutes but remains elevated. Discussed tx options.  Pt resistant to trying a thiazide and would like to avoid beta blockers due to COPD. Will check BMET and follow up in two weeks. Basic Metabolic Panel  2. COPD (chronic obstructive pulmonary disease)  Continue meds and follow up per Dr. Delford Field.

## 2011-08-18 NOTE — Patient Instructions (Signed)
We are adding amlodipine to your losartan. Please make sure you are taking both every day. Please come back in 2 weeks to see me.

## 2011-08-25 ENCOUNTER — Other Ambulatory Visit: Payer: Self-pay | Admitting: Family Medicine

## 2011-08-25 DIAGNOSIS — E785 Hyperlipidemia, unspecified: Secondary | ICD-10-CM

## 2011-08-26 ENCOUNTER — Other Ambulatory Visit (INDEPENDENT_AMBULATORY_CARE_PROVIDER_SITE_OTHER): Payer: Medicare HMO

## 2011-08-26 DIAGNOSIS — E785 Hyperlipidemia, unspecified: Secondary | ICD-10-CM

## 2011-08-26 LAB — LIPID PANEL
Cholesterol: 181 mg/dL (ref 0–200)
VLDL: 24.2 mg/dL (ref 0.0–40.0)

## 2011-08-26 LAB — COMPREHENSIVE METABOLIC PANEL
CO2: 27 mEq/L (ref 19–32)
Calcium: 9.3 mg/dL (ref 8.4–10.5)
Creatinine, Ser: 0.7 mg/dL (ref 0.4–1.2)
GFR: 84.55 mL/min (ref >60.00)
Glucose, Bld: 94 mg/dL (ref 70–99)
Total Bilirubin: 0.2 mg/dL — ABNORMAL LOW (ref 0.3–1.2)
Total Protein: 6.8 g/dL (ref 6.0–8.3)

## 2011-08-28 ENCOUNTER — Ambulatory Visit (INDEPENDENT_AMBULATORY_CARE_PROVIDER_SITE_OTHER): Payer: Medicare HMO | Admitting: Family Medicine

## 2011-08-28 ENCOUNTER — Encounter: Payer: Self-pay | Admitting: Family Medicine

## 2011-08-28 VITALS — BP 144/70 | HR 102 | Temp 97.7°F | Wt 138.0 lb

## 2011-08-28 DIAGNOSIS — R350 Frequency of micturition: Secondary | ICD-10-CM

## 2011-08-28 LAB — POCT URINALYSIS DIPSTICK
Bilirubin, UA: NEGATIVE
Ketones, UA: NEGATIVE
Spec Grav, UA: 1.015

## 2011-08-28 MED ORDER — CEPHALEXIN 500 MG PO CAPS: 500.0000 mg | ORAL_CAPSULE | Freq: Two times a day (BID) | ORAL | Status: AC

## 2011-08-28 NOTE — Patient Instructions (Signed)
You have a bladder infection. Please take Keflex as directed- 1 tablet twice daily for 7 days. Keep drinking plenty of fluids.

## 2011-08-28 NOTE — Progress Notes (Signed)
Addended by: Eliezer Bottom on: 08/28/2011 10:11 AM   Modules accepted: Orders

## 2011-08-28 NOTE — Progress Notes (Signed)
SUBJECTIVE: [name] is a 78 y.o. female who complains of urinary frequency, urgency x 3 days.  She denies dysuria, flank pain, fever, chills, or abnormal vaginal discharge or bleeding.   Multiple abx allergies.  Patient Active Problem List  Diagnoses  . SHINGLES  . ADRENAL MASS  . HYPERLIPIDEMIA  . GLAUCOMA  . CERUMEN IMPACTION, LEFT  . HYPERTENSION  . ABDOMINAL AORTIC ANEURYSM  . HEMORRHOIDS, INTERNAL  . ACUTE FRONTAL SINUSITIS  . ACUTE BRONCHITIS  . GASTRITIS  . DUODENITIS, WITHOUT HEMORRHAGE  . HIATAL HERNIA  . PSORIASIS  . SEBORRHEIC KERATOSIS  . OSTEOARTHRITIS  . HIP PAIN, RIGHT  . BACK PAIN, LUMBAR  . OSTEOPOROSIS  . SCOLIOSIS  . FATIGUE  . RIB PAIN, LEFT SIDED  . DYSPHAGIA UNSPECIFIED  . DYSPHAGIA  . CHANGE IN BOWELS  . GROIN PAIN  . Laceration of lower leg  . COPD (chronic obstructive pulmonary disease)  . GERD (gastroesophageal reflux disease)  . HTN (hypertension)  . Hyperlipemia  . Acute-on-chronic respiratory failure   Past Medical History  Diagnosis Date  . History of colonic polyps   . COPD (chronic obstructive pulmonary disease)   . Diverticulitis of colon   . GERD (gastroesophageal reflux disease)   . HTN (hypertension)   . Osteoarthritis   . Hyperlipemia   . Aortic aneurysm, abdominal   . Ovarian cyst, left   . Psoriasis   . Chronic respiratory failure     Uses home oxygen at 2 L  . ADRENAL MASS 05/04/2007  . GLAUCOMA 03/30/2007  . HEMORRHOIDS, INTERNAL 03/23/2008  . OSTEOPOROSIS 03/30/2007  . SCOLIOSIS 03/30/2007  . PONV (postoperative nausea and vomiting)    Past Surgical History  Procedure Date  . Kidney stone surgery   . Appendectomy   . Tonsillectomy   . Hemiarthroplasty shoulder fracture   . Cataract extraction, bilateral   . Abdominal aortic aneurysm repair   . Abdominal hysterectomy    History  Substance Use Topics  . Smoking status: Former Smoker -- 0.5 packs/day for 40 years    Types: Cigarettes    Quit date:  07/17/2008  . Smokeless tobacco: Never Used  . Alcohol Use: No   Family History  Problem Relation Age of Onset  . Heart attack Mother   . Heart attack Father   . Deep vein thrombosis Father   . Cancer Brother     Pancreas and colon   Allergies  Allergen Reactions  . Clarithromycin     REACTION: abd pain  . Cleartuss Dh     REACTION: feels funny  . Iohexol      Desc: pt reports dyspnea and throat swelling about age 13 w/ IV contrast reaction when kidneys were being checked; 07/25/08 SLG   . Moxifloxacin     REACTION: hallucinations  . Prednisone   . Sulfonamide Derivatives     REACTION: unknown   Current Outpatient Prescriptions on File Prior to Visit  Medication Sig Dispense Refill  . albuterol (PROVENTIL) (2.5 MG/3ML) 0.083% nebulizer solution Take 3 mLs (2.5 mg total) by nebulization every 6 (six) hours as needed for wheezing.  360 mL  6  . ALPRAZolam (XANAX) 0.5 MG tablet Take 1 tablet (0.5 mg total) by mouth at bedtime as needed.  90 tablet  0  . amLODipine (NORVASC) 5 MG tablet Take 1 tablet (5 mg total) by mouth daily.  90 tablet  3  . Aspirin-Acetaminophen-Caffeine (GOODY HEADACHE PO) Take 1 packet by mouth 2 (two) times daily  as needed. For pain.      . budesonide (PULMICORT) 0.25 MG/2ML nebulizer solution Take 2 mLs (0.25 mg total) by nebulization 2 (two) times daily.  60 mL  11  . chlorpheniramine-HYDROcodone (TUSSIONEX) 10-8 MG/5ML LQCR Take 5 mLs by mouth every 12 (twelve) hours as needed.  250 mL  1  . esomeprazole (NEXIUM) 40 MG capsule Take 1 capsule (40 mg total) by mouth daily.  30 capsule  12  . furosemide (LASIX) 20 MG tablet Take 1 tablet (20 mg total) by mouth daily.  10 tablet  0  . Garlic (GARLIQUE) 400 MG TBEC Take 1 tablet by mouth daily.        . halobetasol (ULTRAVATE) 0.05 % cream Apply 1 application topically daily as needed.       [name] HYDROcodone-acetaminophen (VICODIN) 5-500 MG per tablet Take 1 tablet by mouth every 6 (six) hours as needed for pain.   90 tablet  0  . ipratropium (ATROVENT) 0.02 % nebulizer solution Take 2.5 mLs (500 mcg total) by nebulization 4 (four) times daily.  360 mL  6  . losartan (COZAAR) 100 MG tablet Take 1 tablet (100 mg total) by mouth daily.  30 tablet  6  . potassium chloride (MICRO-K) 10 MEQ CR capsule Take 1 capsule (10 mEq total) by mouth daily.  10 capsule  0  . predniSONE (DELTASONE) 10 MG tablet Take 4 for three days 3 for three days 2 for three days 1 for three days and stop  30 tablet  0  . rosuvastatin (CRESTOR) 10 MG tablet Take 1 tablet (10 mg total) by mouth daily.  30 tablet  3   The PMH, PSH, Social History, Family History, Medications, and allergies have been reviewed in [facility], and have been updated if relevant.  OBJECTIVE: BP 144/70  Pulse 102  Temp(Src) 97.7 F (36.5 C) (Oral)  Wt 138 lb (62.596 kg)  SpO2 91%   Appears well, in no apparent distress.  Vital signs are normal. The abdomen is soft without tenderness, guarding, mass, rebound or organomegaly. No CVA tenderness or inguinal adenopathy noted. Urine dipstick shows positive for WBC's and positive for RBC's.    ASSESSMENT: UTI uncomplicated without evidence of pyelonephritis  PLAN: Treatment per orders - also push fluids, may use Pyridium OTC prn. Call or return to clinic prn if these symptoms worsen or fail to improve as anticipated.

## 2011-09-01 LAB — URINE CULTURE: Colony Count: >=100000

## 2011-09-19 ENCOUNTER — Ambulatory Visit (INDEPENDENT_AMBULATORY_CARE_PROVIDER_SITE_OTHER): Payer: Medicare HMO | Admitting: Critical Care Medicine

## 2011-09-19 ENCOUNTER — Encounter: Payer: Self-pay | Admitting: Critical Care Medicine

## 2011-09-19 VITALS — BP 162/90 | HR 105 | Temp 98.0°F | Ht 60.0 in | Wt 140.6 lb

## 2011-09-19 DIAGNOSIS — J449 Chronic obstructive pulmonary disease, unspecified: Secondary | ICD-10-CM

## 2011-09-19 MED ORDER — IPRATROPIUM-ALBUTEROL 0.5-2.5 (3) MG/3ML IN SOLN: 3.0000 mL | Freq: Four times a day (QID) | RESPIRATORY_TRACT | Status: DC

## 2011-09-19 NOTE — Patient Instructions (Signed)
Reorder duoneb , use 4 times daily in nebulizer Use oxygen 2Liter rest  3 liter exertion, a new portable oxygen system will be obtained Return 3 months

## 2011-09-19 NOTE — Progress Notes (Signed)
Subjective:    Patient ID: [name], female    DOB: Dec 31, 1930, 78 y.o.   MRN: [id number]  HPI  78 y.o. WF Copd IV [place].  Oxygen dependent.  07/30/2011 Not seen since 06/2009 Pt in hosp for copd exac 2/1- 07/22/11 Just finished pred/doxy.  Now is worse , feels like has more fluid on abdomen. Feels like wants to cut breath off.  Notes some edema in feet.  No cough .  No real mucus.  Notes white thick mucus.   >>Doxycycline and steroid taper   2/27 Followup  Returns for follow up . She is feeling only minimal improvement. Says she had a bad reaction to prednisone with stomach bloating. Has decreased congestion. [place] short of breath with minimal activity. Taking atrovent and albuterol Four times a day   [place] feels weak with no energy.  No hemotpysis , chest pain or edema.   09/19/2011 At last OV budesonide was offered, pt could not afford the meds. On albuterol and atrovent.  Makes pt cough .  Nose bleeds. Patient is frustrated by the fact that she has frequent exacerbations but cannot afford brand inhalers. Furthermore when she was given the generic albuterol and Atrovent in the nebulizer she can only of afford the separated medications and these do not work as well as [employer]. The patient is also not using her portable oxygen as prescribed.    Past Medical History  Diagnosis Date  . History of colonic polyps   . COPD (chronic obstructive pulmonary disease)   . Diverticulitis of colon   . GERD (gastroesophageal reflux disease)   . HTN (hypertension)   . Osteoarthritis   . Hyperlipemia   . Aortic aneurysm, abdominal   . Ovarian cyst, left   . Psoriasis   . Chronic respiratory failure     Uses home oxygen at 2 L  . ADRENAL MASS 05/04/2007  . GLAUCOMA 03/30/2007  . HEMORRHOIDS, INTERNAL 03/23/2008  . OSTEOPOROSIS 03/30/2007  . SCOLIOSIS 03/30/2007  . PONV (postoperative nausea and vomiting)      Family History  Problem Relation Age of Onset  . Heart attack Mother   .  Heart attack Father   . Deep vein thrombosis Father   . Cancer Brother     Pancreas and colon     History   Social History  . Marital Status: Married    Spouse Name: Richard    Number of Children: 2  . Years of Education: 11   Occupational History  . [employer]   . Factory work    Social History Main Topics  . Smoking status: Former Smoker -- 0.5 packs/day for 40 years    Types: Cigarettes    Quit date: 06/16/2009  . Smokeless tobacco: Never Used  . Alcohol Use: No  . Drug Use: No  . Sexually Active: No   Other Topics Concern  . Not on file   Social History Narrative   Married.  Lives in [place] with her husband.  Normally independent of ADLs and ambulation.     Allergies  Allergen Reactions  . Clarithromycin     REACTION: abd pain  . Hydrocodone-Guaifenesin     REACTION: feels funny  . Iohexol      Desc: pt reports dyspnea and throat swelling about age 89 w/ IV contrast reaction when kidneys were being checked; 07/25/08 SLG   . Moxifloxacin     REACTION: hallucinations  . Prednisone   . Sulfonamide Derivatives  REACTION: unknown     Outpatient Prescriptions Prior to Visit  Medication Sig Dispense Refill  . ALPRAZolam (XANAX) 0.5 MG tablet Take 1 tablet (0.5 mg total) by mouth at bedtime as needed.  90 tablet  0  . esomeprazole (NEXIUM) 40 MG capsule Take 1 capsule (40 mg total) by mouth daily.  30 capsule  12  . Garlic (GARLIQUE) 400 MG TBEC Take 1 tablet by mouth daily.        . halobetasol (ULTRAVATE) 0.05 % cream Apply 1 application topically daily as needed.       [name] losartan (COZAAR) 100 MG tablet Take 1 tablet (100 mg total) by mouth daily.  30 tablet  6  . rosuvastatin (CRESTOR) 10 MG tablet Take 1 tablet (10 mg total) by mouth daily.  30 tablet  3  . albuterol (PROVENTIL) (2.5 MG/3ML) 0.083% nebulizer solution Take 3 mLs (2.5 mg total) by nebulization every 6 (six) hours as needed for wheezing.  360 mL  6  . furosemide (LASIX) 20 MG tablet Take 1  tablet (20 mg total) by mouth daily.  10 tablet  0  . potassium chloride (MICRO-K) 10 MEQ CR capsule Take 1 capsule (10 mEq total) by mouth daily.  10 capsule  0  . amLODipine (NORVASC) 5 MG tablet Take 1 tablet (5 mg total) by mouth daily.  90 tablet  3  . Aspirin-Acetaminophen-Caffeine (GOODY HEADACHE PO) Take 1 packet by mouth 2 (two) times daily as needed. For pain.      . budesonide (PULMICORT) 0.25 MG/2ML nebulizer solution Take 2 mLs (0.25 mg total) by nebulization 2 (two) times daily.  60 mL  11  . chlorpheniramine-HYDROcodone (TUSSIONEX) 10-8 MG/5ML LQCR Take 5 mLs by mouth every 12 (twelve) hours as needed.  250 mL  1  . ipratropium (ATROVENT) 0.02 % nebulizer solution Take 2.5 mLs (500 mcg total) by nebulization 4 (four) times daily.  360 mL  6  . predniSONE (DELTASONE) 10 MG tablet Take 4 for three days 3 for three days 2 for three days 1 for three days and stop  30 tablet  0      Review of Systems  Constitutional:   No  weight loss, night sweats,  Fevers, chills,  +fatigue, lassitude. HEENT:   No headaches,  Difficulty swallowing,  Tooth/dental problems,  Sore throat,                No sneezing, itching, ear ache, + nasal congestion, post nasal drip,   CV:  No chest pain,  Orthopnea, PND, swelling in lower extremities, anasarca, dizziness, palpitations  GI  No heartburn, indigestion, abdominal pain, nausea, vomiting, diarrhea, change in bowel habits, loss of appetite  Resp: No coughing up of blood.    No chest wall deformity  Skin: no rash or lesions.  GU: no dysuria, change in color of urine, no urgency or frequency.  No flank pain.  MS:  No joint pain or swelling.  No decreased range of motion.  No back pain.  Psych:  No change in mood or affect. No depression or anxiety.  No memory loss.     Objective:   Physical Exam  Filed Vitals:   09/19/11 1352 09/19/11 1353  BP:  162/90  Pulse:  105  Temp:  98 F (36.7 C)  TempSrc:  Oral  Height:  5' (1.524 m)    Weight:  140 lb 9.6 oz (63.776 kg)  SpO2: 88% 94%    Gen: Anxious white female  in no acute distress  ENT: No lesions,  mouth clear,  oropharynx clear, no postnasal drip  Neck: No JVD, no TMG, no carotid bruits  Lungs: No use of accessory muscles, no dullness to percussion, distant BS, no wheezes  Cardiovascular: RRR, heart sounds normal, no murmur or gallops, no peripheral edema  Abdomen: soft and NT, no HSM,  BS normal  Musculoskeletal: No deformities, no cyanosis or clubbing  Neuro: alert, non focal  Skin: Warm, no lesions or rashes          Assessment & Plan:   COPD (chronic obstructive pulmonary disease) Severe chronic obstructive lung disease nearing end-stage Significant frustration in that patient cannot afford branded inhalers or combination nebulized therapy The patient is also noncompliant with portable oxygen Plan  Will attempt obtain DuoNeb for this patient but the patient realizes this will be at increased cough Prescribed portable oxygen system at 3 L continuous Return 3 months     Updated Medication List Outpatient Encounter Prescriptions as of 09/19/2011  Medication Sig Dispense Refill  . ALPRAZolam (XANAX) 0.5 MG tablet Take 1 tablet (0.5 mg total) by mouth at bedtime as needed.  90 tablet  0  . esomeprazole (NEXIUM) 40 MG capsule Take 1 capsule (40 mg total) by mouth daily.  30 capsule  12  . furosemide (LASIX) 20 MG tablet Take 20 mg by mouth daily as needed.      . Garlic (GARLIQUE) 400 MG TBEC Take 1 tablet by mouth daily.        . halobetasol (ULTRAVATE) 0.05 % cream Apply 1 application topically daily as needed.       [name] HYDROcodone-acetaminophen (VICODIN) 5-500 MG per tablet as needed.      [name] ipratropium-albuterol (DUONEB) 0.5-2.5 (3) MG/3ML SOLN Take 3 mLs by nebulization 4 (four) times daily.  360 mL  6  . losartan (COZAAR) 100 MG tablet Take 1 tablet (100 mg total) by mouth daily.  30 tablet  6  . potassium chloride (MICRO-K) 10 MEQ CR  capsule Take 10 mEq by mouth daily as needed.      . rosuvastatin (CRESTOR) 10 MG tablet Take 1 tablet (10 mg total) by mouth daily.  30 tablet  3  . DISCONTD: albuterol (PROVENTIL) (2.5 MG/3ML) 0.083% nebulizer solution Take 3 mLs (2.5 mg total) by nebulization every 6 (six) hours as needed for wheezing.  360 mL  6  . DISCONTD: furosemide (LASIX) 20 MG tablet Take 1 tablet (20 mg total) by mouth daily.  10 tablet  0  . DISCONTD: ipratropium-albuterol (DUONEB) 0.5-2.5 (3) MG/3ML SOLN Take 3 mLs by nebulization 4 (four) times daily.      [name] DISCONTD: potassium chloride (MICRO-K) 10 MEQ CR capsule Take 1 capsule (10 mEq total) by mouth daily.  10 capsule  0  . amLODipine (NORVASC) 5 MG tablet ON HOLD      . DISCONTD: amLODipine (NORVASC) 5 MG tablet Take 1 tablet (5 mg total) by mouth daily.  90 tablet  3  . DISCONTD: Aspirin-Acetaminophen-Caffeine (GOODY HEADACHE PO) Take 1 packet by mouth 2 (two) times daily as needed. For pain.      [name] DISCONTD: budesonide (PULMICORT) 0.25 MG/2ML nebulizer solution Take 2 mLs (0.25 mg total) by nebulization 2 (two) times daily.  60 mL  11  . DISCONTD: chlorpheniramine-HYDROcodone (TUSSIONEX) 10-8 MG/5ML LQCR Take 5 mLs by mouth every 12 (twelve) hours as needed.  250 mL  1  . DISCONTD: ipratropium (ATROVENT) 0.02 % nebulizer solution Take 2.5  mLs (500 mcg total) by nebulization 4 (four) times daily.  360 mL  6  . DISCONTD: predniSONE (DELTASONE) 10 MG tablet Take 4 for three days 3 for three days 2 for three days 1 for three days and stop  30 tablet  0

## 2011-09-19 NOTE — Assessment & Plan Note (Signed)
Severe chronic obstructive lung disease nearing end-stage Significant frustration in that patient cannot afford branded inhalers or combination nebulized therapy The patient is also noncompliant with portable oxygen Plan  Will attempt obtain DuoNeb for this patient but the patient realizes this will be at increased cough Prescribed portable oxygen system at 3 L continuous Return 3 months

## 2011-09-23 ENCOUNTER — Telehealth: Payer: Self-pay

## 2011-09-23 ENCOUNTER — Telehealth: Payer: Self-pay | Admitting: Critical Care Medicine

## 2011-09-23 NOTE — Telephone Encounter (Signed)
Pt said insurance sent notice that namebrand Xanax is not on formulary and last refill pt paid $80.00. Pt said next refill of Alaprazolam due 10/09/11 and wants 30 day supply generic sent to [facility]. Pt can be reached at [phone]. Pt last seen 08/28/11.

## 2011-09-23 NOTE — Telephone Encounter (Signed)
I spoke with the pt and advised of recs. I offered to make her an appt tomorrow with MD here but she refused. She wants the message sent to Dr. Delford Field to address on his return. I advised I will send the message but if she feels she needs to be seen to call us in AM to get an appt. Pt states she will. Please advise on prednisone. [name], CMA

## 2011-09-23 NOTE — Telephone Encounter (Signed)
That would be last resort and should be discussed with Dr Delford Field when he returns - Tammy can see her in interim prn

## 2011-09-23 NOTE — Telephone Encounter (Signed)
Advised pharmacist.  Pt has one refill left on current script, they will fill with generic next time.

## 2011-09-23 NOTE — Telephone Encounter (Signed)
Pt called to see if PW will be willing to put her on a maintaince dose of prednisone daily as this is the only med that helps her with her breathing; she does not want any taper dose as the huge amount of prednisone makes her feel like she is crashing once tapering down. PW will be out of the office for emergency need; will send to MW to advise as pt would like to start this asap. Thanks.

## 2011-09-23 NOTE — Telephone Encounter (Signed)
Please call her pharmacy and give them orders to refill 30 day supply of generic alprazolam 0.5 mg qhs after 4/25- no refills.

## 2011-09-25 MED ORDER — PREDNISONE 10 MG PO TABS: 10.0000 mg | ORAL_TABLET | Freq: Every day | ORAL | Status: DC

## 2011-09-25 NOTE — Telephone Encounter (Signed)
Spoke with pt and notified PW okay with prescribing prednisone 10 mg daily as a trial. Pt verbalized understanding and states nothing further needed. Rx was sent to pharm.

## 2011-09-25 NOTE — Telephone Encounter (Signed)
i am ok with a trial of prednisone 10mg  daily  #30

## 2011-09-25 NOTE — Telephone Encounter (Signed)
ATC line busy x 3 wcb 

## 2011-11-03 ENCOUNTER — Other Ambulatory Visit: Payer: Self-pay | Admitting: *Deleted

## 2011-11-03 MED ORDER — ALPRAZOLAM 0.5 MG PO TABS: 0.5000 mg | ORAL_TABLET | Freq: Every evening | ORAL | Status: DC | PRN

## 2011-11-03 NOTE — Telephone Encounter (Signed)
OK to refill? Please send back to Laurie. 

## 2011-11-03 NOTE — Telephone Encounter (Signed)
Medicine called to pharmacy. 

## 2011-11-04 ENCOUNTER — Encounter: Payer: Self-pay | Admitting: Family Medicine

## 2011-11-04 ENCOUNTER — Ambulatory Visit (INDEPENDENT_AMBULATORY_CARE_PROVIDER_SITE_OTHER)
Admission: RE | Admit: 2011-11-04 | Discharge: 2011-11-04 | Disposition: A | Discharge status: Home / Self Care | Payer: Medicare HMO | Source: Ambulatory Visit | Attending: Family Medicine | Admitting: Family Medicine

## 2011-11-04 ENCOUNTER — Ambulatory Visit (INDEPENDENT_AMBULATORY_CARE_PROVIDER_SITE_OTHER): Payer: Medicare HMO | Admitting: Family Medicine

## 2011-11-04 VITALS — BP 152/90 | HR 76 | Temp 97.9°F | Wt 141.0 lb

## 2011-11-04 DIAGNOSIS — M25519 Pain in unspecified shoulder: Secondary | ICD-10-CM

## 2011-11-04 DIAGNOSIS — I10 Essential (primary) hypertension: Secondary | ICD-10-CM

## 2011-11-04 DIAGNOSIS — M25511 Pain in right shoulder: Secondary | ICD-10-CM

## 2011-11-04 DIAGNOSIS — R040 Epistaxis: Secondary | ICD-10-CM

## 2011-11-04 MED ORDER — HYDROCODONE-ACETAMINOPHEN 5-500 MG PO TABS: 1.0000 | ORAL_TABLET | Freq: Four times a day (QID) | ORAL | Status: DC | PRN

## 2011-11-04 NOTE — Patient Instructions (Signed)
You have rotator cuff impingement Try to avoid painful activities (overhead activities, lifting with extended arm) as much as possible. Aleve and/or tylenol as needed for pain. Subacromial injection may be beneficial to help with pain and to decrease inflammation. If not improving, will need MRI/referral to your orthopedist.

## 2011-11-04 NOTE — Progress Notes (Signed)
Subjective:    Patient ID: [name], female    DOB: July 18, 1930, 78 y.o.   MRN: [id number]  HPI  78 yo with h/o COPD IV, O2 dependent here for several concerns.  1.  HTN-   HTN- BP elevated today but in significant pain (right shoulder). BP Readings from Last 3 Encounters:  11/04/11 152/90  09/19/11 162/90  08/28/11 144/70   Restarted her Losartan 100 mg daily and reports that BPs have been much better. Could not tolerate amlodipine- said "it made her BP crazy." No CP, SOB, LE or blurred vision.  Right shoulder pain- acute onset of severe right shoulder pain over the weekend.  Cannot lift arm without excruciating pain. No known trauma or injury.  Has severe OA and has had right rotator cuff surgery in past. No UE tingling, neck pain or UE weakness.  Nose bleeds- had two brief right nostril nose bleeds over the weekend.  Bleeding stopped within minutes. Wears O2 per nasal canula.  No dizziness with these nosebleeds.  Past Medical History  Diagnosis Date  . History of colonic polyps   . COPD (chronic obstructive pulmonary disease)   . Diverticulitis of colon   . GERD (gastroesophageal reflux disease)   . HTN (hypertension)   . Osteoarthritis   . Hyperlipemia   . Aortic aneurysm, abdominal   . Ovarian cyst, left   . Psoriasis   . Chronic respiratory failure     Uses home oxygen at 2 L  . ADRENAL MASS 05/04/2007  . GLAUCOMA 03/30/2007  . HEMORRHOIDS, INTERNAL 03/23/2008  . OSTEOPOROSIS 03/30/2007  . SCOLIOSIS 03/30/2007  . PONV (postoperative nausea and vomiting)      Family History  Problem Relation Age of Onset  . Heart attack Mother   . Heart attack Father   . Deep vein thrombosis Father   . Cancer Brother     Pancreas and colon     History   Social History  . Marital Status: Married    Spouse Name: Richard    Number of Children: 2  . Years of Education: 11   Occupational History  . [employer]   . Factory work    Social History Main Topics  .  Smoking status: Former Smoker -- 0.5 packs/day for 40 years    Types: Cigarettes    Quit date: 06/16/2009  . Smokeless tobacco: Never Used  . Alcohol Use: No  . Drug Use: No  . Sexually Active: No   Other Topics Concern  . Not on file   Social History Narrative   Married.  Lives in [place] with her husband.  Normally independent of ADLs and ambulation.     Allergies  Allergen Reactions  . Clarithromycin     REACTION: abd pain  . Hydrocodone-Guaifenesin     REACTION: feels funny  . Iohexol      Desc: pt reports dyspnea and throat swelling about age 81 w/ IV contrast reaction when kidneys were being checked; 07/25/08 SLG   . Moxifloxacin     REACTION: hallucinations  . Prednisone   . Sulfonamide Derivatives     REACTION: unknown     Outpatient Prescriptions Prior to Visit  Medication Sig Dispense Refill  . ALPRAZolam (XANAX) 0.5 MG tablet Take 1 tablet (0.5 mg total) by mouth at bedtime as needed.  90 tablet  0  . amLODipine (NORVASC) 5 MG tablet ON HOLD      . esomeprazole (NEXIUM) 40 MG capsule Take 1 capsule (40 mg  total) by mouth daily.  30 capsule  12  . furosemide (LASIX) 20 MG tablet Take 20 mg by mouth daily as needed.      . Garlic (GARLIQUE) 400 MG TBEC Take 1 tablet by mouth daily.        . halobetasol (ULTRAVATE) 0.05 % cream Apply 1 application topically daily as needed.       [name] ipratropium-albuterol (DUONEB) 0.5-2.5 (3) MG/3ML SOLN Take 3 mLs by nebulization 4 (four) times daily.  360 mL  6  . losartan (COZAAR) 100 MG tablet Take 1 tablet (100 mg total) by mouth daily.  30 tablet  6  . potassium chloride (MICRO-K) 10 MEQ CR capsule Take 10 mEq by mouth daily as needed.      . rosuvastatin (CRESTOR) 10 MG tablet Take 1 tablet (10 mg total) by mouth daily.  30 tablet  3  . HYDROcodone-acetaminophen (VICODIN) 5-500 MG per tablet as needed.      . predniSONE (DELTASONE) 10 MG tablet Take 1 tablet (10 mg total) by mouth daily.  30 tablet  0      Review of  Systems  See HPI Feels tired. No HA, blurred vision      Objective:   Physical Exam  Filed Vitals:   11/04/11 0815  BP: 152/90  Pulse: 76  Temp: 97.9 F (36.6 C)  TempSrc: Oral  Weight: 141 lb (63.957 kg)   BP: 152/90 mmHg    Gen: Pleasant, well-nourished, in no distress,  normal affect ENT: No lesions,  mouth clear,  oropharynx clear, no postnasal drip, no surface vessel noted. Neck: No JVD, no TMG, no carotid bruits Lungs: No use of accessory muscles, no dullness to percussion, distant BS Cardiovascular: RRR, heart sounds normal, no murmur or gallops, no peripheral edema Musculoskeletal:  Right shoulder- normal to inspection and palpation, pos arch sign, pos empty can, severe pain when she tries to extend arm to right. Neuro: alert, non focal Skin: Warm, no lesions or rashes      Assessment & Plan:   1. Right shoulder pain  Most likely rotator cuff impingement/ tear with possible labrum involvement. Advised referral to ortho, she would like to hold off on seeing them. Will get xray today although I explained that she will likely need an MR. See pt instructions for details. DG Shoulder Right  2. HTN (hypertension)  Stable.   3. Epistaxis  Resolved. Likely due to surface nasal blood vessel aggrevated by nasal canula.  No obvious vessel to cauterize on exam today.

## 2011-11-13 ENCOUNTER — Other Ambulatory Visit: Payer: Self-pay | Admitting: Critical Care Medicine

## 2011-11-14 ENCOUNTER — Telehealth: Payer: Self-pay | Admitting: *Deleted

## 2011-11-14 MED ORDER — PREDNISONE 10 MG PO TABS: 10.0000 mg | ORAL_TABLET | Freq: Every day | ORAL | Status: DC

## 2011-11-14 NOTE — Telephone Encounter (Signed)
Rx for pred refilled x 1 only Spoke with pt and notified to keep ov with PW for further recs.  Pt verbalized understanding and states nothing further needed.

## 2011-11-14 NOTE — Telephone Encounter (Signed)
Refill prednisone.  No new recs until seen and examined

## 2011-11-14 NOTE — Telephone Encounter (Signed)
Received prednisone refill request.  Per phone msg from 09/23/11, pt was to trial prednisone 10 mg qd to see if this helped her breathing.    Called, spoke with pt.  Pt states she does feel the prednisone helped her breathing "a little bit" but c/o increase in cough, increased SOB, wheezing, and chest tightness or almost 1 wk.  Cough is prod with thick, white mucus.  Denies f/c/s.  I offered OV today with TP but pt declined OV on today.  She has scheduled OV with Dr. Delford Field on June 5.  She would like prednisone refilled and any further recs until OV on June 5.  Dr. Delford Field, pls advise.  Thank you.

## 2011-11-19 ENCOUNTER — Ambulatory Visit: Payer: Medicare HMO | Admitting: Critical Care Medicine

## 2011-12-04 ENCOUNTER — Other Ambulatory Visit: Payer: Self-pay | Admitting: Orthopedic Surgery

## 2011-12-04 DIAGNOSIS — M25511 Pain in right shoulder: Secondary | ICD-10-CM

## 2011-12-09 ENCOUNTER — Ambulatory Visit
Admission: RE | Admit: 2011-12-09 | Discharge: 2011-12-09 | Disposition: A | Discharge status: Home / Self Care | Payer: Medicare HMO | Source: Ambulatory Visit | Attending: Orthopedic Surgery | Admitting: Orthopedic Surgery

## 2011-12-09 DIAGNOSIS — M25511 Pain in right shoulder: Secondary | ICD-10-CM

## 2012-01-13 ENCOUNTER — Encounter: Payer: Self-pay | Admitting: Critical Care Medicine

## 2012-01-13 ENCOUNTER — Ambulatory Visit (INDEPENDENT_AMBULATORY_CARE_PROVIDER_SITE_OTHER): Payer: Medicare HMO | Admitting: Critical Care Medicine

## 2012-01-13 VITALS — BP 126/78 | HR 78 | Temp 98.2°F | Ht 60.0 in | Wt 142.6 lb

## 2012-01-13 DIAGNOSIS — J449 Chronic obstructive pulmonary disease, unspecified: Secondary | ICD-10-CM

## 2012-01-13 MED ORDER — METHYLPREDNISOLONE ACETATE 80 MG/ML IJ SUSP
120.0000 mg | Freq: Once | INTRAMUSCULAR | Status: AC
  Administered 2012-01-13: 120 mg via INTRAMUSCULAR

## 2012-01-13 NOTE — Patient Instructions (Addendum)
A 120mg  Depomedrol injection was given Stay on nebulizer  Follow reflux diet Return 4 months

## 2012-01-13 NOTE — Assessment & Plan Note (Signed)
Chronic obstructive lung disease with primary emphysematous component now with mild flare Plan A 120mg  Depomedrol injection was given Stay on nebulizer  Follow reflux diet Return 4 months

## 2012-01-13 NOTE — Progress Notes (Signed)
Subjective:    Patient ID: [name], female    DOB: 1931/04/18, 78 y.o.   MRN: [id number]  HPI  78 y.o. W[place] Copd IV Golds.  Oxygen dependent.   09/19/2011 At last OV budesonide was offered, pt could not afford the meds. On albuterol and atrovent.  Makes pt cough .  Nose bleeds. Patient is frustrated by the fact that she has frequent exacerbations but cannot afford brand inhalers. Furthermore when she was given the generic albuterol and Atrovent in the nebulizer she can only of afford the separated medications and these do not work as well as [employer]. The patient is also not using her portable oxygen as prescribed.  01/13/2012 On premixed DuoNeb now.  Now more wheezing and mucus is white Pt notes some nocturnal dyspnea. Pt with ongoing heartburn.  Throat is not sore Pt denies any significant sore throat, nasal congestion or excess secretions, fever, chills, sweats, unintended weight loss, pleurtic or exertional chest pain, orthopnea PND, or leg swelling Pt denies any increase in rescue therapy over baseline, denies waking up needing it or having any early am or nocturnal exacerbations of coughing/wheezing/or dyspnea. Pt also denies any obvious fluctuation in symptoms with  weather or environmental change or other alleviating or aggravating factors     Past Medical History  Diagnosis Date  . History of colonic polyps   . COPD (chronic obstructive pulmonary disease)   . Diverticulitis of colon   . GERD (gastroesophageal reflux disease)   . HTN (hypertension)   . Osteoarthritis   . Hyperlipemia   . Aortic aneurysm, abdominal   . Ovarian cyst, left   . Psoriasis   . Chronic respiratory failure     Uses home oxygen at 2 L  . ADRENAL MASS 05/04/2007  . GLAUCOMA 03/30/2007  . HEMORRHOIDS, INTERNAL 03/23/2008  . OSTEOPOROSIS 03/30/2007  . SCOLIOSIS 03/30/2007  . PONV (postoperative nausea and vomiting)      Family History  Problem Relation Age of Onset  . Heart attack Mother     . Heart attack Father   . Deep vein thrombosis Father   . Cancer Brother     Pancreas and colon     History   Social History  . Marital Status: Married    Spouse Name: Richard    Number of Children: 2  . Years of Education: 11   Occupational History  . [employer]   . Factory work    Social History Main Topics  . Smoking status: Former Smoker -- 0.5 packs/day for 40 years    Types: Cigarettes    Quit date: 06/16/2009  . Smokeless tobacco: Never Used  . Alcohol Use: No  . Drug Use: No  . Sexually Active: No   Other Topics Concern  . Not on file   Social History Narrative   Married.  Lives in [place] with her husband.  Normally independent of ADLs and ambulation.     Allergies  Allergen Reactions  . Clarithromycin     REACTION: abd pain  . Hydrocodone-Guaifenesin     REACTION: feels funny  . Iohexol      Desc: pt reports dyspnea and throat swelling about age 67 w/ IV contrast reaction when kidneys were being checked; 07/25/08 SLG   . Moxifloxacin     REACTION: hallucinations  . Prednisone   . Sulfonamide Derivatives     REACTION: unknown     Outpatient Prescriptions Prior to Visit  Medication Sig Dispense Refill  . ALPRAZolam Prudy Feeler)  0.5 MG tablet Take 1 tablet (0.5 mg total) by mouth at bedtime as needed.  90 tablet  0  . esomeprazole (NEXIUM) 40 MG capsule Take 1 capsule (40 mg total) by mouth daily.  30 capsule  12  . Garlic (GARLIQUE) 400 MG TBEC Take 1 tablet by mouth daily.        . halobetasol (ULTRAVATE) 0.05 % cream Apply 1 application topically daily as needed.       [name] HYDROcodone-acetaminophen (VICODIN) 5-500 MG per tablet Take 1 tablet by mouth every 6 (six) hours as needed.  60 tablet  0  . ipratropium-albuterol (DUONEB) 0.5-2.5 (3) MG/3ML SOLN Take 3 mLs by nebulization 4 (four) times daily.  360 mL  6  . losartan (COZAAR) 100 MG tablet Take 1 tablet (100 mg total) by mouth daily.  30 tablet  6  . potassium chloride (MICRO-K) 10 MEQ CR capsule Take  10 mEq by mouth daily as needed.      . predniSONE (DELTASONE) 10 MG tablet Take 1 tablet (10 mg total) by mouth daily.  30 tablet  0  . rosuvastatin (CRESTOR) 10 MG tablet Take 1 tablet (10 mg total) by mouth daily.  30 tablet  3  . furosemide (LASIX) 20 MG tablet Take 20 mg by mouth daily as needed.       No facility-administered medications prior to visit.      Review of Systems  Constitutional:   No  weight loss, night sweats,  Fevers, chills,  +fatigue, lassitude. HEENT:   No headaches,  Difficulty swallowing,  Tooth/dental problems,  Sore throat,                No sneezing, itching, ear ache, + nasal congestion, post nasal drip,   CV:  No chest pain,  Orthopnea, PND, swelling in lower extremities, anasarca, dizziness, palpitations  GI  No heartburn, indigestion, abdominal pain, nausea, vomiting, diarrhea, change in bowel habits, loss of appetite  Resp: No coughing up of blood.    No chest wall deformity  Skin: no rash or lesions.  GU: no dysuria, change in color of urine, no urgency or frequency.  No flank pain.  MS:  No joint pain or swelling.  No decreased range of motion.  No back pain.  Psych:  No change in mood or affect. No depression or anxiety.  No memory loss.     Objective:   Physical Exam  Filed Vitals:   01/13/12 1447  BP: 126/78  Pulse: 78  Temp: 98.2 F (36.8 C)  TempSrc: Oral  Height: 5' (1.524 m)  Weight: 142 lb 9.6 oz (64.683 kg)  SpO2: 95%    Gen: Anxious white female in no acute distress  ENT: No lesions,  mouth clear,  oropharynx clear, no postnasal drip  Neck: No JVD, no TMG, no carotid bruits  Lungs: No use of accessory muscles, no dullness to percussion, distant BS, no wheezes  Cardiovascular: RRR, heart sounds normal, no murmur or gallops, no peripheral edema  Abdomen: soft and NT, no HSM,  BS normal  Musculoskeletal: No deformities, no cyanosis or clubbing  Neuro: alert, non focal  Skin: Warm, no lesions or  rashes          Assessment & Plan:   COPD (chronic obstructive pulmonary disease) Chronic obstructive lung disease with primary emphysematous component now with mild flare Plan A 120mg  Depomedrol injection was given Stay on nebulizer  Follow reflux diet Return 4 months     Updated  Medication List Outpatient Encounter Prescriptions as of 01/13/2012  Medication Sig Dispense Refill  . ALPRAZolam (XANAX) 0.5 MG tablet Take 1 tablet (0.5 mg total) by mouth at bedtime as needed.  90 tablet  0  . esomeprazole (NEXIUM) 40 MG capsule Take 1 capsule (40 mg total) by mouth daily.  30 capsule  12  . Garlic (GARLIQUE) 400 MG TBEC Take 1 tablet by mouth daily.        . halobetasol (ULTRAVATE) 0.05 % cream Apply 1 application topically daily as needed.       [name] HYDROcodone-acetaminophen (VICODIN) 5-500 MG per tablet Take 1 tablet by mouth every 6 (six) hours as needed.  60 tablet  0  . ipratropium-albuterol (DUONEB) 0.5-2.5 (3) MG/3ML SOLN Take 3 mLs by nebulization 4 (four) times daily.  360 mL  6  . losartan (COZAAR) 100 MG tablet Take 1 tablet (100 mg total) by mouth daily.  30 tablet  6  . potassium chloride (MICRO-K) 10 MEQ CR capsule Take 10 mEq by mouth daily as needed.      . predniSONE (DELTASONE) 10 MG tablet Take 1 tablet (10 mg total) by mouth daily.  30 tablet  0  . rosuvastatin (CRESTOR) 10 MG tablet Take 1 tablet (10 mg total) by mouth daily.  30 tablet  3  . furosemide (LASIX) 20 MG tablet Take 20 mg by mouth daily as needed.       Facility-Administered Encounter Medications as of 01/13/2012  Medication Dose Route Frequency Provider Last Rate Last Dose  . methylPREDNISolone acetate (DEPO-MEDROL) injection 120 mg  120 mg Intramuscular Once Storm Frisk, MD   120 mg at 01/13/12 1505

## 2012-01-16 ENCOUNTER — Other Ambulatory Visit: Payer: Self-pay | Admitting: Family Medicine

## 2012-01-21 ENCOUNTER — Other Ambulatory Visit: Payer: Self-pay | Admitting: *Deleted

## 2012-01-21 MED ORDER — ALPRAZOLAM 0.5 MG PO TABS: 0.5000 mg | ORAL_TABLET | Freq: Every evening | ORAL | Status: DC | PRN

## 2012-01-21 NOTE — Telephone Encounter (Signed)
Received faxed refill request from pharmacy. Last fill date 12/27/11. Last office visit 11/04/11; acute visit. Is it okay to refill medication?

## 2012-01-21 NOTE — Telephone Encounter (Signed)
Medicine called [facility]. 

## 2012-02-02 ENCOUNTER — Telehealth: Payer: Self-pay | Admitting: Critical Care Medicine

## 2012-02-02 NOTE — Telephone Encounter (Signed)
ATC, Line was busy, WCB 

## 2012-02-03 MED ORDER — IPRATROPIUM-ALBUTEROL 0.5-2.5 (3) MG/3ML IN SOLN: 3.0000 mL | Freq: Four times a day (QID) | RESPIRATORY_TRACT | Status: DC

## 2012-02-03 NOTE — Telephone Encounter (Signed)
I spoke with pt and made her aware RX has been sent. She voiced her understanding and needed nothing further

## 2012-02-03 NOTE — Telephone Encounter (Signed)
Pt returned triage's call.  Holly D Pryor ° °

## 2012-02-03 NOTE — Telephone Encounter (Signed)
Pt last seen by PW 7.30.13.  Rx last sent 4.5.13 to [facility].  Per pt's chart, she can only have the combo med > does not tolerate the 2 medications separately.  Rx sent.  LMOM TCB x1 to inform pt.

## 2012-02-19 ENCOUNTER — Ambulatory Visit (INDEPENDENT_AMBULATORY_CARE_PROVIDER_SITE_OTHER): Payer: Medicare HMO | Admitting: Family Medicine

## 2012-02-19 ENCOUNTER — Encounter: Payer: Self-pay | Admitting: Family Medicine

## 2012-02-19 ENCOUNTER — Telehealth: Payer: Self-pay

## 2012-02-19 VITALS — BP 126/80 | HR 88 | Temp 98.3°F | Wt 139.0 lb

## 2012-02-19 DIAGNOSIS — J449 Chronic obstructive pulmonary disease, unspecified: Secondary | ICD-10-CM

## 2012-02-19 MED ORDER — AZITHROMYCIN 250 MG PO TABS: ORAL_TABLET | ORAL | Status: AC

## 2012-02-19 MED ORDER — METHYLPREDNISOLONE ACETATE 40 MG/ML IJ SUSP
40.0000 mg | Freq: Once | INTRAMUSCULAR | Status: AC
  Administered 2012-02-19: 40 mg via INTRAMUSCULAR

## 2012-02-19 MED ORDER — OXYCODONE-ACETAMINOPHEN 5-325 MG PO TABS
1.0000 | ORAL_TABLET | ORAL | Status: DC | PRN
Start: 2012-02-19 — End: 2012-03-17

## 2012-02-19 NOTE — Telephone Encounter (Signed)
Pts husband went to [facility] and z pack was not there. I called [facility] and called z pack as instructed; med ready for pick up in 15 mins. Pt notified while on phone. Spoke with Marisue Ivan at [facility] and cancelled z pack request.

## 2012-02-19 NOTE — Progress Notes (Signed)
78  yo with h/o COPD well known to me here with inc in cough and change/inc in sputum over past 3 days.   No fevers but chills last night.     Some relief of cough with SABA.  Using SABA more than normal.  Subjective chills last night.  Tolerates zpack.   Afebrile.  Coughed so hard last night, had pain in left back rib area.  Some sinus pressure.  Patient Active Problem List  Diagnosis  . SHINGLES  . ADRENAL MASS  . HYPERLIPIDEMIA  . GLAUCOMA  . CERUMEN IMPACTION, LEFT  . HYPERTENSION  . ABDOMINAL AORTIC ANEURYSM  . HEMORRHOIDS, INTERNAL  . GASTRITIS  . DUODENITIS, WITHOUT HEMORRHAGE  . HIATAL HERNIA  . PSORIASIS  . SEBORRHEIC KERATOSIS  . OSTEOARTHRITIS  . HIP PAIN, RIGHT  . BACK PAIN, LUMBAR  . OSTEOPOROSIS  . SCOLIOSIS  . FATIGUE  . RIB PAIN, LEFT SIDED  . DYSPHAGIA UNSPECIFIED  . DYSPHAGIA  . CHANGE IN BOWELS  . GROIN PAIN  . Laceration of lower leg  . COPD (chronic obstructive pulmonary disease)  . GERD (gastroesophageal reflux disease)  . HTN (hypertension)  . Hyperlipemia  . Chronic respiratory failure  . Epistaxis  . Right shoulder pain   Past Medical History  Diagnosis Date  . History of colonic polyps   . COPD (chronic obstructive pulmonary disease)   . Diverticulitis of colon   . GERD (gastroesophageal reflux disease)   . HTN (hypertension)   . Osteoarthritis   . Hyperlipemia   . Aortic aneurysm, abdominal   . Ovarian cyst, left   . Psoriasis   . Chronic respiratory failure     Uses home oxygen at 2 L  . ADRENAL MASS 05/04/2007  . GLAUCOMA 03/30/2007  . HEMORRHOIDS, INTERNAL 03/23/2008  . OSTEOPOROSIS 03/30/2007  . SCOLIOSIS 03/30/2007  . PONV (postoperative nausea and vomiting)    Past Surgical History  Procedure Date  . Kidney stone surgery   . Appendectomy   . Tonsillectomy   . Hemiarthroplasty shoulder fracture   . Cataract extraction, bilateral   . Abdominal aortic aneurysm repair   . Abdominal hysterectomy    History    Substance Use Topics  . Smoking status: Former Smoker -- 0.5 packs/day for 40 years    Types: Cigarettes    Quit date: 06/16/2009  . Smokeless tobacco: Never Used  . Alcohol Use: No   Family History  Problem Relation Age of Onset  . Heart attack Mother   . Heart attack Father   . Deep vein thrombosis Father   . Cancer Brother     Pancreas and colon   Allergies  Allergen Reactions  . Clarithromycin     REACTION: abd pain  . Hydrocodone-Guaifenesin     REACTION: feels funny  . Iohexol      Desc: pt reports dyspnea and throat swelling about age 55 w/ IV contrast reaction when kidneys were being checked; 07/25/08 SLG   . Moxifloxacin     REACTION: hallucinations  . Prednisone   . Sulfonamide Derivatives     REACTION: unknown   Current Outpatient Prescriptions on File Prior to Visit  Medication Sig Dispense Refill  . ALPRAZolam (XANAX) 0.5 MG tablet Take 1 tablet (0.5 mg total) by mouth at bedtime as needed.  90 tablet  0  . esomeprazole (NEXIUM) 40 MG capsule Take 1 capsule (40 mg total) by mouth daily.  30 capsule  12  . furosemide (LASIX) 20 MG tablet Take  20 mg by mouth daily as needed.      . Garlic (GARLIQUE) 400 MG TBEC Take 1 tablet by mouth daily.        . halobetasol (ULTRAVATE) 0.05 % cream Apply 1 application topically daily as needed.       [name] HYDROcodone-acetaminophen (VICODIN) 5-500 MG per tablet Take 1 tablet by mouth every 6 (six) hours as needed.  60 tablet  0  . ipratropium-albuterol (DUONEB) 0.5-2.5 (3) MG/3ML SOLN Take 3 mLs by nebulization 4 (four) times daily.  360 mL  6  . losartan (COZAAR) 100 MG tablet Take 1 tablet (100 mg total) by mouth daily.  30 tablet  6  . losartan (COZAAR) 100 MG tablet TAKE 1 TABLET (100 MG TOTAL) BY MOUTH DAILY.  30 tablet  11  . potassium chloride (MICRO-K) 10 MEQ CR capsule Take 10 mEq by mouth daily as needed.      . rosuvastatin (CRESTOR) 10 MG tablet Take 1 tablet (10 mg total) by mouth daily.  30 tablet  3   The PMH,  PSH, Social History, Family History, Medications, and allergies have been reviewed in [facility], and have been updated if relevant.   Review of Systems       See HPI General:  Complains of chills; denies fever. CV:  Denies chest pain or discomfort. Resp:  Complains of cough, shortness of breath, sputum productive, and wheezing.  Physical Exam BP 126/80  Pulse 88  Temp 98.3 F (36.8 C)  Wt 139 lb (63.05 kg)  SpO2 94%  General:  GEN: nad, alert and oriented HEENT: mucous membranes moist, TM wnl, no OP exudates NECK: supple w/o LA CV: rrr.   PULM:  no inc wob but scattered exp wheeze and pseudowheeze with forces expiration. ABD: soft, +bs EXT: no edema SKIN: no acute rash   Assessment and Plan:  1. COPD (chronic obstructive pulmonary disease)    Deteriorated. Zpack. IM depomedrol 40 mg x 1 given in office today. Continue nebs. The patient indicates understanding of these issues and agrees with the plan.

## 2012-02-19 NOTE — Addendum Note (Signed)
Addended by: Eliezer Bottom on: 02/19/2012 01:15 PM   Modules accepted: Orders

## 2012-02-23 ENCOUNTER — Encounter: Payer: Self-pay | Admitting: Vascular Surgery

## 2012-02-24 ENCOUNTER — Ambulatory Visit (INDEPENDENT_AMBULATORY_CARE_PROVIDER_SITE_OTHER): Payer: Medicare HMO | Admitting: Vascular Surgery

## 2012-02-24 ENCOUNTER — Encounter: Payer: Self-pay | Admitting: Vascular Surgery

## 2012-02-24 ENCOUNTER — Encounter (INDEPENDENT_AMBULATORY_CARE_PROVIDER_SITE_OTHER): Payer: Medicare HMO

## 2012-02-24 VITALS — BP 179/81 | HR 74 | Resp 20 | Ht 60.0 in | Wt 139.0 lb

## 2012-02-24 DIAGNOSIS — Z48812 Encounter for surgical aftercare following surgery on the circulatory system: Secondary | ICD-10-CM

## 2012-02-24 DIAGNOSIS — I714 Abdominal aortic aneurysm, without rupture, unspecified: Secondary | ICD-10-CM

## 2012-02-24 NOTE — Progress Notes (Signed)
Subjective:     Patient ID: [name], female   DOB: [birth year], 78 y.o.   MRN: [id number]  HPI this 78 year old female returns for continued followup regarding her aortic stent graft repair of an abdominal aortic aneurysm performed in May of 2010 using aGore Excluder graft. Patient has done very well. She does have severe COPD and is on nasal oxygen continuously. This has been stable recently. He denies any new abdominal or back symptoms. She ambulates short distances with some dyspnea.  Past Medical History  Diagnosis Date  . History of colonic polyps   . COPD (chronic obstructive pulmonary disease)   . Diverticulitis of colon   . GERD (gastroesophageal reflux disease)   . HTN (hypertension)   . Osteoarthritis   . Hyperlipemia   . Aortic aneurysm, abdominal   . Ovarian cyst, left   . Psoriasis   . Chronic respiratory failure     Uses home oxygen at 2 L  . ADRENAL MASS 05/04/2007  . GLAUCOMA 03/30/2007  . HEMORRHOIDS, INTERNAL 03/23/2008  . OSTEOPOROSIS 03/30/2007  . SCOLIOSIS 03/30/2007  . PONV (postoperative nausea and vomiting)     History  Substance Use Topics  . Smoking status: Former Smoker -- 0.5 packs/day for 40 years    Types: Cigarettes    Quit date: 06/16/2009  . Smokeless tobacco: Never Used  . Alcohol Use: No    Family History  Problem Relation Age of Onset  . Heart attack Mother   . Heart attack Father   . Deep vein thrombosis Father   . Cancer Brother     Pancreas and colon    Allergies  Allergen Reactions  . Clarithromycin     REACTION: abd pain  . Hydrocodone-Guaifenesin     REACTION: feels funny  . Iohexol      Desc: pt reports dyspnea and throat swelling about age 31 w/ IV contrast reaction when kidneys were being checked; 07/25/08 SLG   . Moxifloxacin     REACTION: hallucinations  . Prednisone   . Sulfonamide Derivatives     REACTION: unknown    Current outpatient prescriptions:ALPRAZolam (XANAX) 0.5 MG tablet, Take 1 tablet (0.5 mg  total) by mouth at bedtime as needed., Disp: 90 tablet, Rfl: 0;  azithromycin (ZITHROMAX Z-PAK) 250 MG tablet, 2 tabs by mouth x 1 days, then 1 tab by mouth days 2-5, Disp: 6 each, Rfl: 0;  esomeprazole (NEXIUM) 40 MG capsule, Take 1 capsule (40 mg total) by mouth daily., Disp: 30 capsule, Rfl: 12 furosemide (LASIX) 20 MG tablet, Take 20 mg by mouth daily as needed., Disp: , Rfl: ;  Garlic (GARLIQUE) 400 MG TBEC, Take 1 tablet by mouth daily.  , Disp: , Rfl: ;  halobetasol (ULTRAVATE) 0.05 % cream, Apply 1 application topically daily as needed. , Disp: , Rfl: ;  HYDROcodone-acetaminophen (VICODIN) 5-500 MG per tablet, Take 1 tablet by mouth every 6 (six) hours as needed., Disp: 60 tablet, Rfl: 0 ipratropium-albuterol (DUONEB) 0.5-2.5 (3) MG/3ML SOLN, Take 3 mLs by nebulization 4 (four) times daily., Disp: 360 mL, Rfl: 6;  losartan (COZAAR) 100 MG tablet, Take 1 tablet (100 mg total) by mouth daily., Disp: 30 tablet, Rfl: 6;  losartan (COZAAR) 100 MG tablet, TAKE 1 TABLET (100 MG TOTAL) BY MOUTH DAILY., Disp: 30 tablet, Rfl: 11 oxyCODONE-acetaminophen (PERCOCET/ROXICET) 5-325 MG per tablet, Take 1 tablet by mouth every 4 (four) hours as needed. For pain., Disp: 30 tablet, Rfl: 0;  potassium chloride (MICRO-K) 10 MEQ CR capsule,  Take 10 mEq by mouth daily as needed., Disp: , Rfl: ;  rosuvastatin (CRESTOR) 10 MG tablet, Take 1 tablet (10 mg total) by mouth daily., Disp: 30 tablet, Rfl: 3  BP 179/81  Pulse 74  Resp 20  Ht 5' (1.524 m)  Wt 139 lb (63.05 kg)  BMI 27.15 kg/m2  Body mass index is 27.15 kg/(m^2).           Review of Systems has chest discomfort related to COPD. Also complains of weakness and imbalance in legs, pain in left foot, skin rashes, weakness in the arms at times, wheezing, orthopnea, and dyspnea on exertion. Other systems are negative and complete review of systems    Objective:   Physical Exam blood pressure 179/81 heart rate 74 respirations 20 Gen.-alert and oriented x3  in no apparent distress HEENT normal for age Lungs no rhonchi -bilateral expiratory wheezing-patient on nasal oxygen Cardiovascular regular rhythm no murmurs carotid pulses 3+ palpable no bruits audible Abdomen soft nontender no palpable masses Musculoskeletal free of  major deformities Skin clear -no rashes Neurologic normal Lower extremities 3+ femoral pulses bilaterally. Both feet well perfused.  Today I ordered a duplex scan of the abdominal aorta which are reviewed and interpreted. There is no evidence of endoleak. The stent graft appears to be in good position with no significant changes. Maximum diameter of the aneurysm is essentially unchanged-3.98 x 4.2 cm      Assessment:     Doing well 3 years post aortic stent graft for infrarenal aortic aneurysm    Plan:     Return in one year with CT angiogram for continued followup

## 2012-02-24 NOTE — Addendum Note (Signed)
Addended by: Sharee Pimple on: 02/24/2012 12:55 PM   Modules accepted: Orders

## 2012-03-17 ENCOUNTER — Ambulatory Visit (INDEPENDENT_AMBULATORY_CARE_PROVIDER_SITE_OTHER): Payer: Medicare HMO | Admitting: Family Medicine

## 2012-03-17 ENCOUNTER — Encounter: Payer: Self-pay | Admitting: Family Medicine

## 2012-03-17 ENCOUNTER — Telehealth: Payer: Self-pay | Admitting: *Deleted

## 2012-03-17 VITALS — BP 140/80 | HR 72 | Temp 97.9°F | Wt 142.0 lb

## 2012-03-17 DIAGNOSIS — K5289 Other specified noninfective gastroenteritis and colitis: Secondary | ICD-10-CM

## 2012-03-17 DIAGNOSIS — K529 Noninfective gastroenteritis and colitis, unspecified: Secondary | ICD-10-CM

## 2012-03-17 DIAGNOSIS — Z23 Encounter for immunization: Secondary | ICD-10-CM

## 2012-03-17 MED ORDER — HYDROCODONE-ACETAMINOPHEN 5-500 MG PO TABS
1.0000 | ORAL_TABLET | Freq: Four times a day (QID) | ORAL | Status: DC | PRN
Start: 2012-03-17 — End: 2012-04-27

## 2012-03-17 NOTE — Addendum Note (Signed)
Addended by: Eliezer Bottom on: 03/17/2012 12:34 PM   Modules accepted: Orders

## 2012-03-17 NOTE — Progress Notes (Signed)
(S) [name] is a 78 y.o. female with complaint of gastrointestinal symptoms of watery diarrhea, lower abdominal cramps, nausea, belching for 3 days. No blood in stool. Started shortly after she ate at [employer].  No fevers. No sensation that food is getting stuck- she is s/p esophageal dilation for stricture in 2009.  Patient Active Problem List  Diagnosis  . SHINGLES  . ADRENAL MASS  . HYPERLIPIDEMIA  . GLAUCOMA  . CERUMEN IMPACTION, LEFT  . HYPERTENSION  . ABDOMINAL AORTIC ANEURYSM  . HEMORRHOIDS, INTERNAL  . GASTRITIS  . DUODENITIS, WITHOUT HEMORRHAGE  . HIATAL HERNIA  . PSORIASIS  . SEBORRHEIC KERATOSIS  . OSTEOARTHRITIS  . HIP PAIN, RIGHT  . BACK PAIN, LUMBAR  . OSTEOPOROSIS  . SCOLIOSIS  . FATIGUE  . RIB PAIN, LEFT SIDED  . DYSPHAGIA UNSPECIFIED  . DYSPHAGIA  . CHANGE IN BOWELS  . GROIN PAIN  . Laceration of lower leg  . COPD (chronic obstructive pulmonary disease)  . GERD (gastroesophageal reflux disease)  . HTN (hypertension)  . Hyperlipemia  . Chronic respiratory failure  . Epistaxis  . Right shoulder pain   Past Medical History  Diagnosis Date  . History of colonic polyps   . COPD (chronic obstructive pulmonary disease)   . Diverticulitis of colon   . GERD (gastroesophageal reflux disease)   . HTN (hypertension)   . Osteoarthritis   . Hyperlipemia   . Aortic aneurysm, abdominal   . Ovarian cyst, left   . Psoriasis   . Chronic respiratory failure     Uses home oxygen at 2 L  . ADRENAL MASS 05/04/2007  . GLAUCOMA 03/30/2007  . HEMORRHOIDS, INTERNAL 03/23/2008  . OSTEOPOROSIS 03/30/2007  . SCOLIOSIS 03/30/2007  . PONV (postoperative nausea and vomiting)    Past Surgical History  Procedure Date  . Kidney stone surgery   . Appendectomy   . Tonsillectomy   . Hemiarthroplasty shoulder fracture   . Cataract extraction, bilateral   . Abdominal aortic aneurysm repair   . Abdominal hysterectomy    History  Substance Use Topics  . Smoking  status: Former Smoker -- 0.5 packs/day for 40 years    Types: Cigarettes    Quit date: 06/16/2009  . Smokeless tobacco: Never Used  . Alcohol Use: No   Family History  Problem Relation Age of Onset  . Heart attack Mother   . Heart attack Father   . Deep vein thrombosis Father   . Cancer Brother     Pancreas and colon   Allergies  Allergen Reactions  . Clarithromycin     REACTION: abd pain  . Hydrocodone-Guaifenesin     REACTION: feels funny  . Iohexol      Desc: pt reports dyspnea and throat swelling about age 55 w/ IV contrast reaction when kidneys were being checked; 07/25/08 SLG   . Moxifloxacin     REACTION: hallucinations  . Prednisone   . Sulfonamide Derivatives     REACTION: unknown   Current Outpatient Prescriptions on File Prior to Visit  Medication Sig Dispense Refill  . ALPRAZolam (XANAX) 0.5 MG tablet Take 1 tablet (0.5 mg total) by mouth at bedtime as needed.  90 tablet  0  . esomeprazole (NEXIUM) 40 MG capsule Take 1 capsule (40 mg total) by mouth daily.  30 capsule  12  . furosemide (LASIX) 20 MG tablet Take 20 mg by mouth daily as needed.      . Garlic (GARLIQUE) 400 MG TBEC Take  1 tablet by mouth daily.        . halobetasol (ULTRAVATE) 0.05 % cream Apply 1 application topically daily as needed.       [name] ipratropium-albuterol (DUONEB) 0.5-2.5 (3) MG/3ML SOLN Take 3 mLs by nebulization 4 (four) times daily.  360 mL  6  . losartan (COZAAR) 100 MG tablet Take 1 tablet (100 mg total) by mouth daily.  30 tablet  6  . losartan (COZAAR) 100 MG tablet TAKE 1 TABLET (100 MG TOTAL) BY MOUTH DAILY.  30 tablet  11  . potassium chloride (MICRO-K) 10 MEQ CR capsule Take 10 mEq by mouth daily as needed.      . rosuvastatin (CRESTOR) 10 MG tablet Take 1 tablet (10 mg total) by mouth daily.  30 tablet  3   The PMH, PSH, Social History, Family History, Medications, and allergies have been reviewed in [facility], and have been updated if relevant.  (O)  BP 140/80  Pulse 72  Temp  97.9 F (36.6 C)  Wt 142 lb (64.411 kg)  Physical exam reveals the patient appears well. Hydration status: well hydrated. Abdomen: abdomen is soft without significant tenderness, masses, organomegaly or guarding..  (A) Viral Gastroenteritis  (P) I have recommended small amounts clear fluids frequently, soups, juices, water and advance diet as tolerated. Return office visit if symptoms persist or worsen; I have alerted the patient to call if high fever, dehydration, marked weakness, fainting, increased abdominal pain, blood in stool or vomit.

## 2012-03-17 NOTE — Telephone Encounter (Signed)
Pt was given a pneumovax in 1996 and she is asking if she needs another one.  Says a nurse at the hospital told her she did, the last time she was there. Please advise.

## 2012-03-17 NOTE — Patient Instructions (Addendum)
Good to see you. I think you have a little stomach bug. Please use gas x or beano as needed for the bloating. Eat foods that easy on the stomach- soft foods, crackers, bananas.

## 2012-03-18 ENCOUNTER — Ambulatory Visit: Payer: Medicare HMO

## 2012-03-18 NOTE — Telephone Encounter (Signed)
Advised patient, appt scheduled. 

## 2012-03-18 NOTE — Telephone Encounter (Signed)
Yes ok for her to have another one.

## 2012-03-23 ENCOUNTER — Ambulatory Visit: Payer: Medicare HMO

## 2012-03-29 ENCOUNTER — Encounter: Payer: Self-pay | Admitting: Pulmonary Disease

## 2012-03-29 ENCOUNTER — Ambulatory Visit (INDEPENDENT_AMBULATORY_CARE_PROVIDER_SITE_OTHER): Payer: Medicare HMO | Admitting: Pulmonary Disease

## 2012-03-29 ENCOUNTER — Telehealth: Payer: Self-pay | Admitting: Critical Care Medicine

## 2012-03-29 ENCOUNTER — Ambulatory Visit (INDEPENDENT_AMBULATORY_CARE_PROVIDER_SITE_OTHER)
Admission: RE | Admit: 2012-03-29 | Discharge: 2012-03-29 | Disposition: A | Discharge status: Home / Self Care | Payer: Medicare HMO | Source: Ambulatory Visit | Attending: Pulmonary Disease | Admitting: Pulmonary Disease

## 2012-03-29 VITALS — BP 124/70 | HR 111 | Temp 98.1°F | Ht 60.0 in | Wt 147.4 lb

## 2012-03-29 DIAGNOSIS — J9610 Chronic respiratory failure, unspecified whether with hypoxia or hypercapnia: Secondary | ICD-10-CM

## 2012-03-29 DIAGNOSIS — J449 Chronic obstructive pulmonary disease, unspecified: Secondary | ICD-10-CM

## 2012-03-29 MED ORDER — METHYLPREDNISOLONE ACETATE 80 MG/ML IJ SUSP
120.0000 mg | Freq: Once | INTRAMUSCULAR | Status: AC
  Administered 2012-03-29: 120 mg via INTRAMUSCULAR

## 2012-03-29 MED ORDER — PREDNISONE 10 MG PO TABS: ORAL_TABLET | ORAL | Status: DC

## 2012-03-29 MED ORDER — AZITHROMYCIN 1 G PO PACK: 1.0000 | PACK | Freq: Once | ORAL | Status: DC

## 2012-03-29 NOTE — Patient Instructions (Addendum)
Solumedrol 120 mg IM now Prednisone 10 mg tabs Take 4 tabs  daily with food x 4 days, then 3 tabs daily x 4 days, then 2 tabs daily x 4 days, then 1 tab daily x4 days then stop. #40 Take nebs 4 times daily until better  Z-pak Chest xray today Call if no better in 1-2 days

## 2012-03-29 NOTE — Telephone Encounter (Signed)
I spoke with pt and she c/o increase SOB and wanted to be seen today. I scheduled pt to come in to be seen today at 1:45. Nothing further was needed

## 2012-03-29 NOTE — Assessment & Plan Note (Signed)
OK to increase to 3 L  until flare resolved

## 2012-03-29 NOTE — Assessment & Plan Note (Signed)
Acute flare - Solumedrol 120 mg IM now Prednisone 10 mg tabs Take 4 tabs  daily with food x 4 days, then 3 tabs daily x 4 days, then 2 tabs daily x 4 days, then 1 tab daily x4 days then stop. #40 Take nebs 4 times daily until better  Z-pak Chest xray today

## 2012-03-29 NOTE — Progress Notes (Signed)
  Subjective:    Patient ID: [name], female    DOB: 11-Jun-1931, 78 y.o.   MRN: [id number]  HPI 78 y.o. WF Copd IV Golds. Oxygen dependent.  Adm 2/13      01/13/2012  >> solumedrol depot  03/29/2012 - Acute visit PW pt. c/o wheezing, chest tx, increase SOB, slight cough w/ white phlem, left side rib pain, left back pain.  Woke up at 2am , took neb , again sob while moving around this am [name] may have been burning something in their backyard Only on duonebs, quit using advair due to sore throat, budesonide was offered, pt could not afford the meds.  CXR -no new infiltrate   Past Medical History  Diagnosis Date  . History of colonic polyps   . COPD (chronic obstructive pulmonary disease)   . Diverticulitis of colon   . GERD (gastroesophageal reflux disease)   . HTN (hypertension)   . Osteoarthritis   . Hyperlipemia   . Aortic aneurysm, abdominal   . Ovarian cyst, left   . Psoriasis   . Chronic respiratory failure     Uses home oxygen at 2 L  . ADRENAL MASS 05/04/2007  . GLAUCOMA 03/30/2007  . HEMORRHOIDS, INTERNAL 03/23/2008  . OSTEOPOROSIS 03/30/2007  . SCOLIOSIS 03/30/2007  . PONV (postoperative nausea and vomiting)       Review of Systems neg for any significant sore throat, dysphagia, itching, sneezing, nasal congestion or excess/ purulent secretions, fever, chills, sweats, unintended wt loss, pleuritic or exertional cp, hempoptysis, orthopnea pnd or change in chronic leg swelling. Also denies presyncope, palpitations, heartburn, abdominal pain, nausea, vomiting, diarrhea or change in bowel or urinary habits, dysuria,hematuria, rash, arthralgias, visual complaints, headache, numbness weakness or ataxia.     Objective:   Physical Exam  Gen. Pleasant, well-nourished, in no distress, normal affect ENT - no lesions, no post nasal drip Neck: No JVD, no thyromegaly, no carotid bruits Lungs: no use of accessory muscles, no dullness to percussion, diffuse BL  rhonchi  Cardiovascular: Rhythm regular, heart sounds  normal, no murmurs or gallops, no peripheral edema Abdomen: soft and non-tender, no hepatosplenomegaly, BS normal. Musculoskeletal: No deformities, no cyanosis or clubbing Neuro:  alert, non focal        Assessment & Plan:

## 2012-04-09 ENCOUNTER — Other Ambulatory Visit: Payer: Self-pay | Admitting: Family Medicine

## 2012-04-09 NOTE — Telephone Encounter (Signed)
Medicine called to pharmacy. 

## 2012-04-12 ENCOUNTER — Ambulatory Visit: Payer: Medicare HMO | Admitting: Adult Health

## 2012-04-27 ENCOUNTER — Inpatient Hospital Stay (HOSPITAL_COMMUNITY)
Admission: EM | Admit: 2012-04-27 | Discharge: 2012-04-30 | DRG: 191 | Disposition: A | Discharge status: Home Health | Payer: Medicare HMO | Attending: Internal Medicine | Admitting: Internal Medicine

## 2012-04-27 ENCOUNTER — Encounter (HOSPITAL_COMMUNITY): Payer: Self-pay | Admitting: *Deleted

## 2012-04-27 ENCOUNTER — Emergency Department (HOSPITAL_COMMUNITY): Payer: Medicare HMO

## 2012-04-27 DIAGNOSIS — B029 Zoster without complications: Secondary | ICD-10-CM

## 2012-04-27 DIAGNOSIS — K648 Other hemorrhoids: Secondary | ICD-10-CM

## 2012-04-27 DIAGNOSIS — M1990 Unspecified osteoarthritis, unspecified site: Secondary | ICD-10-CM

## 2012-04-27 DIAGNOSIS — J441 Chronic obstructive pulmonary disease with (acute) exacerbation: Principal | ICD-10-CM | POA: Diagnosis present

## 2012-04-27 DIAGNOSIS — H409 Unspecified glaucoma: Secondary | ICD-10-CM

## 2012-04-27 DIAGNOSIS — E785 Hyperlipidemia, unspecified: Secondary | ICD-10-CM | POA: Diagnosis present

## 2012-04-27 DIAGNOSIS — J449 Chronic obstructive pulmonary disease, unspecified: Secondary | ICD-10-CM | POA: Diagnosis present

## 2012-04-27 DIAGNOSIS — R6889 Other general symptoms and signs: Secondary | ICD-10-CM

## 2012-04-27 DIAGNOSIS — R1310 Dysphagia, unspecified: Secondary | ICD-10-CM

## 2012-04-27 DIAGNOSIS — Z66 Do not resuscitate: Secondary | ICD-10-CM | POA: Diagnosis present

## 2012-04-27 DIAGNOSIS — F411 Generalized anxiety disorder: Secondary | ICD-10-CM | POA: Diagnosis present

## 2012-04-27 DIAGNOSIS — R5383 Other fatigue: Secondary | ICD-10-CM

## 2012-04-27 DIAGNOSIS — I714 Abdominal aortic aneurysm, without rupture: Secondary | ICD-10-CM

## 2012-04-27 DIAGNOSIS — M25511 Pain in right shoulder: Secondary | ICD-10-CM

## 2012-04-27 DIAGNOSIS — M4120 Other idiopathic scoliosis, site unspecified: Secondary | ICD-10-CM

## 2012-04-27 DIAGNOSIS — R198 Other specified symptoms and signs involving the digestive system and abdomen: Secondary | ICD-10-CM

## 2012-04-27 DIAGNOSIS — R109 Unspecified abdominal pain: Secondary | ICD-10-CM

## 2012-04-27 DIAGNOSIS — R040 Epistaxis: Secondary | ICD-10-CM

## 2012-04-27 DIAGNOSIS — K219 Gastro-esophageal reflux disease without esophagitis: Secondary | ICD-10-CM | POA: Diagnosis present

## 2012-04-27 DIAGNOSIS — R079 Chest pain, unspecified: Secondary | ICD-10-CM

## 2012-04-27 DIAGNOSIS — K449 Diaphragmatic hernia without obstruction or gangrene: Secondary | ICD-10-CM

## 2012-04-27 DIAGNOSIS — E278 Other specified disorders of adrenal gland: Secondary | ICD-10-CM

## 2012-04-27 DIAGNOSIS — K2970 Gastritis, unspecified, without bleeding: Secondary | ICD-10-CM

## 2012-04-27 DIAGNOSIS — J111 Influenza due to unidentified influenza virus with other respiratory manifestations: Secondary | ICD-10-CM

## 2012-04-27 DIAGNOSIS — L408 Other psoriasis: Secondary | ICD-10-CM

## 2012-04-27 DIAGNOSIS — L821 Other seborrheic keratosis: Secondary | ICD-10-CM

## 2012-04-27 DIAGNOSIS — H6120 Impacted cerumen, unspecified ear: Secondary | ICD-10-CM

## 2012-04-27 DIAGNOSIS — S81819A Laceration without foreign body, unspecified lower leg, initial encounter: Secondary | ICD-10-CM

## 2012-04-27 DIAGNOSIS — M545 Low back pain: Secondary | ICD-10-CM

## 2012-04-27 DIAGNOSIS — K2980 Duodenitis without bleeding: Secondary | ICD-10-CM

## 2012-04-27 DIAGNOSIS — M25559 Pain in unspecified hip: Secondary | ICD-10-CM

## 2012-04-27 DIAGNOSIS — I10 Essential (primary) hypertension: Secondary | ICD-10-CM | POA: Diagnosis present

## 2012-04-27 DIAGNOSIS — J9610 Chronic respiratory failure, unspecified whether with hypoxia or hypercapnia: Secondary | ICD-10-CM | POA: Diagnosis present

## 2012-04-27 DIAGNOSIS — R1319 Other dysphagia: Secondary | ICD-10-CM

## 2012-04-27 DIAGNOSIS — Z9981 Dependence on supplemental oxygen: Secondary | ICD-10-CM

## 2012-04-27 DIAGNOSIS — M810 Age-related osteoporosis without current pathological fracture: Secondary | ICD-10-CM

## 2012-04-27 LAB — BLOOD GAS, ARTERIAL
Bicarbonate: 25.8 mEq/L — ABNORMAL HIGH (ref 20.0–24.0)
TCO2: 23.9 mmol/L (ref 0–100)
pCO2 arterial: 45.1 mmHg — ABNORMAL HIGH (ref 35.0–45.0)
pH, Arterial: 7.376 (ref 7.350–7.450)

## 2012-04-27 LAB — BASIC METABOLIC PANEL
CO2: 28 mEq/L (ref 19–32)
Calcium: 9.6 mg/dL (ref 8.4–10.5)
Creatinine, Ser: 0.62 mg/dL (ref 0.50–1.10)
GFR calc Af Amer: >90 mL/min (ref >90)

## 2012-04-27 LAB — CBC
MCV: 93 fL (ref 78.0–100.0)
Platelets: 244 10*3/uL (ref 150–400)
RDW: 12 % (ref 11.5–15.5)
WBC: 5.6 10*3/uL (ref 4.0–10.5)

## 2012-04-27 LAB — POCT I-STAT TROPONIN I: Troponin i, poc: 0 ng/mL (ref 0.00–0.08)

## 2012-04-27 MED ORDER — ALPRAZOLAM 0.5 MG PO TABS
0.5000 mg | ORAL_TABLET | Freq: Every evening | ORAL | Status: DC | PRN
  Administered 2012-04-27: 0.5 mg via ORAL
  Filled 2012-04-27: qty 1

## 2012-04-27 MED ORDER — ONDANSETRON HCL 4 MG PO TABS: 4.0000 mg | ORAL_TABLET | Freq: Four times a day (QID) | ORAL | Status: DC | PRN

## 2012-04-27 MED ORDER — SODIUM CHLORIDE 0.9 % IV SOLN: INTRAVENOUS | Status: DC

## 2012-04-27 MED ORDER — METHYLPREDNISOLONE SODIUM SUCC 125 MG IJ SOLR
80.0000 mg | Freq: Four times a day (QID) | INTRAMUSCULAR | Status: DC
  Administered 2012-04-27 – 2012-04-28 (×5): 80 mg via INTRAVENOUS
  Administered 2012-04-28: 13:00:00 via INTRAVENOUS
  Filled 2012-04-27 (×11): qty 1.28

## 2012-04-27 MED ORDER — METHYLPREDNISOLONE SODIUM SUCC 125 MG IJ SOLR
125.0000 mg | Freq: Once | INTRAMUSCULAR | Status: AC
  Administered 2012-04-27: 125 mg via INTRAVENOUS
  Filled 2012-04-27: qty 2

## 2012-04-27 MED ORDER — LOSARTAN POTASSIUM 50 MG PO TABS
100.0000 mg | ORAL_TABLET | Freq: Every day | ORAL | Status: DC
  Administered 2012-04-28 – 2012-04-30 (×3): 100 mg via ORAL
  Filled 2012-04-27 (×4): qty 2

## 2012-04-27 MED ORDER — PANTOPRAZOLE SODIUM 40 MG PO TBEC
40.0000 mg | DELAYED_RELEASE_TABLET | Freq: Every day | ORAL | Status: DC
  Administered 2012-04-28: 40 mg via ORAL
  Filled 2012-04-27 (×3): qty 1

## 2012-04-27 MED ORDER — ALBUTEROL SULFATE (5 MG/ML) 0.5% IN NEBU
2.5000 mg | INHALATION_SOLUTION | RESPIRATORY_TRACT | Status: DC
  Administered 2012-04-27 – 2012-04-30 (×14): 2.5 mg via RESPIRATORY_TRACT
  Filled 2012-04-27 (×13): qty 0.5

## 2012-04-27 MED ORDER — HYDROMORPHONE HCL PF 1 MG/ML IJ SOLN: 0.5000 mg | INTRAMUSCULAR | Status: DC | PRN

## 2012-04-27 MED ORDER — ONDANSETRON HCL 4 MG/2ML IJ SOLN: 4.0000 mg | Freq: Four times a day (QID) | INTRAMUSCULAR | Status: DC | PRN

## 2012-04-27 MED ORDER — ALUM & MAG HYDROXIDE-SIMETH 200-200-20 MG/5ML PO SUSP: 30.0000 mL | Freq: Four times a day (QID) | ORAL | Status: DC | PRN

## 2012-04-27 MED ORDER — IPRATROPIUM-ALBUTEROL 0.5-2.5 (3) MG/3ML IN SOLN: 3.0000 mL | Freq: Four times a day (QID) | RESPIRATORY_TRACT | Status: DC

## 2012-04-27 MED ORDER — LOSARTAN POTASSIUM 50 MG PO TABS: 100.0000 mg | ORAL_TABLET | Freq: Every day | ORAL | Status: DC

## 2012-04-27 MED ORDER — ALBUTEROL SULFATE (5 MG/ML) 0.5% IN NEBU: 5.0000 mg | INHALATION_SOLUTION | RESPIRATORY_TRACT | Status: DC

## 2012-04-27 MED ORDER — ALBUTEROL SULFATE (5 MG/ML) 0.5% IN NEBU: 2.5000 mg | INHALATION_SOLUTION | RESPIRATORY_TRACT | Status: DC | PRN

## 2012-04-27 MED ORDER — ONDANSETRON HCL 4 MG/2ML IJ SOLN: 4.0000 mg | Freq: Three times a day (TID) | INTRAMUSCULAR | Status: DC | PRN

## 2012-04-27 MED ORDER — FUROSEMIDE 20 MG PO TABS
20.0000 mg | ORAL_TABLET | Freq: Every day | ORAL | Status: DC | PRN
  Filled 2012-04-27: qty 1

## 2012-04-27 MED ORDER — ACETAMINOPHEN 650 MG RE SUPP: 650.0000 mg | Freq: Four times a day (QID) | RECTAL | Status: DC | PRN

## 2012-04-27 MED ORDER — IPRATROPIUM BROMIDE 0.02 % IN SOLN
0.5000 mg | Freq: Four times a day (QID) | RESPIRATORY_TRACT | Status: DC
  Administered 2012-04-28 – 2012-04-30 (×10): 0.5 mg via RESPIRATORY_TRACT
  Filled 2012-04-27 (×9): qty 2.5

## 2012-04-27 MED ORDER — DOXYCYCLINE HYCLATE 100 MG PO TABS
100.0000 mg | ORAL_TABLET | Freq: Two times a day (BID) | ORAL | Status: DC
  Administered 2012-04-27 – 2012-04-30 (×6): 100 mg via ORAL
  Filled 2012-04-27 (×7): qty 1

## 2012-04-27 MED ORDER — HYDROCODONE-ACETAMINOPHEN 5-325 MG PO TABS
1.0000 | ORAL_TABLET | ORAL | Status: DC | PRN
  Administered 2012-04-27 – 2012-04-30 (×5): 1 via ORAL
  Filled 2012-04-27 (×5): qty 1

## 2012-04-27 MED ORDER — ALBUTEROL SULFATE (5 MG/ML) 0.5% IN NEBU
5.0000 mg | INHALATION_SOLUTION | RESPIRATORY_TRACT | Status: DC
  Administered 2012-04-27: 2.5 mg via RESPIRATORY_TRACT
  Administered 2012-04-27 (×2): 5 mg via RESPIRATORY_TRACT
  Filled 2012-04-27 (×2): qty 1
  Filled 2012-04-27: qty 0.5

## 2012-04-27 MED ORDER — ACETAMINOPHEN 325 MG PO TABS: 650.0000 mg | ORAL_TABLET | Freq: Four times a day (QID) | ORAL | Status: DC | PRN

## 2012-04-27 MED ORDER — DOCUSATE SODIUM 100 MG PO CAPS
100.0000 mg | ORAL_CAPSULE | Freq: Two times a day (BID) | ORAL | Status: DC
  Administered 2012-04-27 – 2012-04-29 (×4): 100 mg via ORAL
  Filled 2012-04-27 (×7): qty 1

## 2012-04-27 MED ORDER — ACETAMINOPHEN 325 MG PO TABS
650.0000 mg | ORAL_TABLET | Freq: Once | ORAL | Status: AC
  Administered 2012-04-27: 650 mg via ORAL
  Filled 2012-04-27: qty 2

## 2012-04-27 MED ORDER — IPRATROPIUM BROMIDE 0.02 % IN SOLN
0.5000 mg | RESPIRATORY_TRACT | Status: DC
  Administered 2012-04-27 (×3): 0.5 mg via RESPIRATORY_TRACT
  Filled 2012-04-27 (×3): qty 2.5

## 2012-04-27 MED ORDER — ALBUTEROL SULFATE (5 MG/ML) 0.5% IN NEBU
5.0000 mg | INHALATION_SOLUTION | Freq: Once | RESPIRATORY_TRACT | Status: AC
  Administered 2012-04-27: 5 mg via RESPIRATORY_TRACT
  Filled 2012-04-27 (×2): qty 1

## 2012-04-27 MED ORDER — PANTOPRAZOLE SODIUM 40 MG PO TBEC: 40.0000 mg | DELAYED_RELEASE_TABLET | Freq: Every day | ORAL | Status: DC

## 2012-04-27 MED ORDER — ATORVASTATIN CALCIUM 20 MG PO TABS
20.0000 mg | ORAL_TABLET | Freq: Every day | ORAL | Status: DC
  Administered 2012-04-27 – 2012-04-29 (×3): 20 mg via ORAL
  Filled 2012-04-27 (×4): qty 1

## 2012-04-27 MED ORDER — ENOXAPARIN SODIUM 40 MG/0.4ML ~~LOC~~ SOLN
40.0000 mg | SUBCUTANEOUS | Status: DC
  Administered 2012-04-27 – 2012-04-29 (×3): 40 mg via SUBCUTANEOUS
  Filled 2012-04-27 (×4): qty 0.4

## 2012-04-27 NOTE — ED Notes (Signed)
Pt reports generalized body aches 10/10. Hx of copd, wears oxygen Savage 3 L continuously at home. has been using her nebulizer every 3 hours because of difficulty breathing. Denies cough, reports night chills.

## 2012-04-27 NOTE — H&P (Signed)
History and Physical  [name] ZOX:[id number] DOB: [birth year] DOA: 04/27/2012  Referring physician: ED PCP: [name], MD   Chief Complaint: Ache all over  HPI:  Patient is a 78 YO WHITE female with past medical history significant for COPD she is O2-dependent on 3 L of nasal cannula at home.  Patient stated that for the past 6 days she's been not feeling well. She complains of diffuse body ache all over. She also complains of chills and subjective fever. She states that she does not feel like she is more short of breath than usual she's not been coughing or she does not feel congested. She does not have any nausea or vomiting. In the emergency room she had a chest x-ray done that did not show any pneumonia but on standing the patient up it with her oxygen at 3 L her oxygen fell into the low 80s. We were asked to admit patient for possible COPD exacerbation.  Chart Review:  Records reviewed  Review of Systems:  10 point review of System negative otherwise stated in the HPI   Past Medical History  Diagnosis Date  . History of colonic polyps   . COPD (chronic obstructive pulmonary disease)   . Diverticulitis of colon   . GERD (gastroesophageal reflux disease)   . HTN (hypertension)   . Osteoarthritis   . Hyperlipemia   . Aortic aneurysm, abdominal   . Ovarian cyst, left   . Psoriasis   . Chronic respiratory failure     Uses home oxygen at 2 L  . ADRENAL MASS 05/04/2007  . GLAUCOMA 03/30/2007  . HEMORRHOIDS, INTERNAL 03/23/2008  . OSTEOPOROSIS 03/30/2007  . SCOLIOSIS 03/30/2007  . PONV (postoperative nausea and vomiting)     Past Surgical History  Procedure Date  . Kidney stone surgery   . Appendectomy   . Tonsillectomy   . Hemiarthroplasty shoulder fracture   . Cataract extraction, bilateral   . Abdominal aortic aneurysm repair   . Abdominal hysterectomy     Social History:  Reports that she quit smoking about 2 years ago.   She has a 20 pack-year smoking  history. She has never used smokeless tobacco. She reports that she does not drink alcohol or use illicit drugs.  She is married and lives with her husband.  She has two children.  Allergies  Allergen Reactions  . Clarithromycin     REACTION: abd pain  . Hydrocodone-Guaifenesin     REACTION: feels funny  . Iohexol      Desc: pt reports dyspnea and throat swelling about age 20 w/ IV contrast reaction when kidneys were being checked; 07/25/08 SLG   . Moxifloxacin     REACTION: hallucinations  . Prednisone   . Sulfonamide Derivatives     REACTION: unknown    Family History  Problem Relation Age of Onset  . Heart attack Mother   . Heart attack Father   . Deep vein thrombosis Father   . Cancer Brother     Pancreas and colon     Prior to Admission medications   Medication Sig Start Date End Date Taking? Authorizing Provider  ALPRAZolam Prudy Feeler) 0.5 MG tablet Take 0.5 mg by mouth at bedtime as needed. sleep   Yes Historical Provider, MD  Aspirin-Acetaminophen-Caffeine (GOODY HEADACHE PO) Take 1 packet by mouth every 4 (four) hours as needed. pain   Yes Historical Provider, MD  esomeprazole (NEXIUM) 40 MG capsule Take 1 capsule (40 mg total) by mouth daily. 05/23/11  Yes Dianne Dun, MD  esomeprazole (NEXIUM) 40 MG capsule Take 40 mg by mouth daily before breakfast.   Yes Historical Provider, MD  furosemide (LASIX) 20 MG tablet Take 20 mg by mouth daily as needed. 07/30/11 07/29/12 Yes Storm Frisk, MD  Garlic (GARLIQUE) 400 MG TBEC Take 1 tablet by mouth daily.     Yes Historical Provider, MD  HYDROcodone-acetaminophen (VICODIN) 5-500 MG per tablet Take 1 tablet by mouth every 6 (six) hours as needed. pain   Yes Historical Provider, MD  ipratropium-albuterol (DUONEB) 0.5-2.5 (3) MG/3ML SOLN Take 3 mLs by nebulization 4 (four) times daily.   Yes Historical Provider, MD  losartan (COZAAR) 100 MG tablet Take 100 mg by mouth daily.   Yes Historical Provider, MD  potassium chloride  (MICRO-K) 10 MEQ CR capsule Take 10 mEq by mouth daily as needed. 07/30/11 07/29/12 Yes Storm Frisk, MD  predniSONE (DELTASONE) 10 MG tablet Take 4 tabs daily with food x 4 days, then 3 tabs daily x 4 days, then 2 tabs daily x 4 days,1 tab daily x4 days and stop 03/29/12  Yes Oretha Milch, MD  rosuvastatin (CRESTOR) 10 MG tablet Take 10 mg by mouth daily.   Yes Historical Provider, MD   Physical Exam: Filed Vitals:   04/27/12 1110 04/27/12 1259 04/27/12 1600 04/27/12 1614  BP: 112/60 125/55  112/48  Pulse: 88 92 93   Temp:  98.4 F (36.9 C)  98.6 F (37 C)  TempSrc:  Oral  Oral  Resp: 14 17 14    SpO2: 99% 100% 98%      General: Patient is sitting up on stretcher. She did not does not seem to be in any acute distress. No accessory muscle use.   HEENT: Head normocephalic atraumatic pupils reactive to light throat is without erythema  Cardiovascular: Regular rate rhythm   Respiratory: Minimal rhonchi. No wheezing no rales  Abdomen: Soft nontender nondistended positive bowel   Skin: Dry and intact  Musculoskeletal: Strength 5/5 throughout   Psychiatric: Normal mood  Neurologic: Nonfocal  Wt Readings from Last 3 Encounters:  03/29/12 66.86 kg (147 lb 6.4 oz)  03/17/12 64.411 kg (142 lb)  02/24/12 63.05 kg (139 lb)    Labs on Admission:  Basic Metabolic Panel:  Lab 04/27/12 1914  NA 138  K 3.5  CL 98  CO2 28  GLUCOSE 99  BUN 22  CREATININE 0.62  CALCIUM 9.6  MG --  PHOS --     CBC:  Lab 04/27/12 1130  WBC 5.6  NEUTROABS --  HGB 10.9*  HCT 34.3*  MCV 93.0  PLT 244    Troponin (Point of Care Test)  Basename 04/27/12 1140  TROPIPOC 0.00     Radiological Exams on Admission: Dg Chest 2 View (if Patient Has Fever And/or Copd)  04/27/2012  *RADIOLOGY REPORT*  Clinical Data: Fever, chills, body  CHEST - 2 VIEW  Comparison: 03/29/2012  Findings: Cardiomediastinal silhouette is stable.  Mild hyperinflation again noted.  Stable chronic mild  interstitial prominence.  Again noted tenting of the left posterior diaphragm. Left humeral prosthesis again noted.  No focal infiltrate or pulmonary edema.  IMPRESSION: No active disease.  Hyperinflation again noted.  Stable tenting of the left hemidiaphragm.   Original Report Authenticated By: Natasha Mead, M.D.     EKG: Independently reviewed. No acute ST elevation or depression    Assessment/Plan  COPD (chronic obstructive pulmonary disease) Patient is here possible with COPD exacerbation. She has  minimal rhonchi in her lungs she's not wheezing she does have oxygen level dropping when she stands up on her 3 L of home oxygen. Because of her body aches will get influenza testing. Will start her on Solu-Medrol and by mouth doxycycline as she is allergic to his azithromycin and also Levaquin. If no improvement in her symptoms and will defer to the rounding physician on further testing. Will check ABG.  She has no chest pain and she does not really feel short of breath more than usual.  Since her symptoms have been going on for more than 6 days and she is not toxic will not start her on Tamiflu.  GERD (gastroesophageal reflux disease) Continue her PPI   HTN (hypertension) Continue her home antihypertensive medications.  Hold for systolic less than 110   Hyperlipemia Continue her cholesterol medication  Code Status: DNR Family Communication: Discussed with Patient and her Husband. Disposition Plan/Anticipated LOS:2-3 days  Time spent: 70 minutes  Molli Posey, MD  [facility] Team 2 Pager [phone]. If 7PM-7AM, please contact night-coverage at [email or web address], password [facility] 04/27/2012, 5:16 PM

## 2012-04-27 NOTE — ED Provider Notes (Signed)
History     CSN: [id number]  Arrival date & time 04/27/12  4098   First MD Initiated Contact with Patient 04/27/12 1111      Chief Complaint  Patient presents with  . Shortness of Breath    (Consider location/radiation/quality/duration/timing/severity/associated sxs/prior treatment) HPI [name] is a 78 y.o. female with a history of severe COPD on 3 L of home oxygen presenting with 3 days history of intermittent fevers, chills occasional cough, shortness of breath and generalized myalgias he described as achy and 10/10. She has not taken anything for them.  Past Medical History  Diagnosis Date  . History of colonic polyps   . COPD (chronic obstructive pulmonary disease)   . Diverticulitis of colon   . GERD (gastroesophageal reflux disease)   . HTN (hypertension)   . Osteoarthritis   . Hyperlipemia   . Aortic aneurysm, abdominal   . Ovarian cyst, left   . Psoriasis   . Chronic respiratory failure     Uses home oxygen at 2 L  . ADRENAL MASS 05/04/2007  . GLAUCOMA 03/30/2007  . HEMORRHOIDS, INTERNAL 03/23/2008  . OSTEOPOROSIS 03/30/2007  . SCOLIOSIS 03/30/2007  . PONV (postoperative nausea and vomiting)     Past Surgical History  Procedure Date  . Kidney stone surgery   . Appendectomy   . Tonsillectomy   . Hemiarthroplasty shoulder fracture   . Cataract extraction, bilateral   . Abdominal aortic aneurysm repair   . Abdominal hysterectomy     Family History  Problem Relation Age of Onset  . Heart attack Mother   . Heart attack Father   . Deep vein thrombosis Father   . Cancer Brother     Pancreas and colon    History  Substance Use Topics  . Smoking status: Former Smoker -- 0.5 packs/day for 40 years    Types: Cigarettes    Quit date: 06/16/2009  . Smokeless tobacco: Never Used  . Alcohol Use: No    OB History    Grav Para Term Preterm Abortions TAB SAB Ect Mult Living                  Review of Systems At least 10pt or greater review of  systems completed and are negative except where specified in the HPI.  Allergies  Clarithromycin; Hydrocodone-guaifenesin; Iohexol; Moxifloxacin; Prednisone; and Sulfonamide derivatives  Home Medications   Current Outpatient Rx  Name  Route  Sig  Dispense  Refill  . ALPRAZOLAM 0.5 MG PO TABS   Oral   Take 0.5 mg by mouth at bedtime as needed. sleep         . GOODY HEADACHE PO   Oral   Take 1 packet by mouth every 4 (four) hours as needed. pain         . ESOMEPRAZOLE MAGNESIUM 40 MG PO CPDR   Oral   Take 1 capsule (40 mg total) by mouth daily.   30 capsule   12   . ESOMEPRAZOLE MAGNESIUM 40 MG PO CPDR   Oral   Take 40 mg by mouth daily before breakfast.         . FUROSEMIDE 20 MG PO TABS   Oral   Take 20 mg by mouth daily as needed.         [name] GARLIC 400 MG PO TBEC   Oral   Take 1 tablet by mouth daily.           [name] HYDROCODONE-ACETAMINOPHEN 5-500 MG PO  TABS   Oral   Take 1 tablet by mouth every 6 (six) hours as needed. pain         . IPRATROPIUM-ALBUTEROL 0.5-2.5 (3) MG/3ML IN SOLN   Nebulization   Take 3 mLs by nebulization 4 (four) times daily.         [name] LOSARTAN POTASSIUM 100 MG PO TABS   Oral   Take 100 mg by mouth daily.         [name] POTASSIUM CHLORIDE ER 10 MEQ PO CPCR   Oral   Take 10 mEq by mouth daily as needed.         [name] PREDNISONE 10 MG PO TABS      Take 4 tabs daily with food x 4 days, then 3 tabs daily x 4 days, then 2 tabs daily x 4 days,1 tab daily x4 days and stop   40 tablet   0   . ROSUVASTATIN CALCIUM 10 MG PO TABS   Oral   Take 10 mg by mouth daily.           BP 119/68  Pulse 100  Temp 98 F (36.7 C) (Oral)  Resp 16  SpO2 90%  Physical Exam  Nursing notes reviewed.  Electronic medical record reviewed. VITAL SIGNS:   Filed Vitals:   04/27/12 1259 04/27/12 1600 04/27/12 1614 04/27/12 1717  BP: 125/55  112/48 146/72  Pulse: 92 93  101  Temp: 98.4 F (36.9 C)  98.6 F (37 C) 97.4 F (36.3 C)  TempSrc:  Oral  Oral Oral  Resp: 17 14  20   Height:    5' (1.524 m)  Weight:    138 lb 3.7 oz (62.7 kg)  SpO2: 100% 98%  100%   CONSTITUTIONAL: Awake, oriented, appears non-toxic HENT: Atraumatic, normocephalic, oral mucosa pink and moist, airway patent. Nares patent without drainage. External ears normal. EYES: Conjunctiva clear, EOMI, PERRLA NECK: Trachea midline, non-tender, supple CARDIOVASCULAR: Normal heart rate, Normal rhythm, No murmurs, rubs, gallops PULMONARY/CHEST: Poor air movement bilaterally, wheezing bilaterally, expiratory. No areas of consolidation, no rales or rhonchi. ABDOMINAL: Non-distended, soft, non-tender - no rebound or guarding.  BS normal. NEUROLOGIC: Non-focal, moving all four extremities, no gross sensory or motor deficits. EXTREMITIES: No clubbing, cyanosis, or edema SKIN: Warm, Dry, No erythema, No rash  ED Course  Procedures (including critical care time)  Labs Reviewed  BASIC METABOLIC PANEL - Abnormal; Notable for the following:    GFR calc non Af Amer 84 (*)     All other components within normal limits  CBC - Abnormal; Notable for the following:    RBC 3.69 (*)     Hemoglobin 10.9 (*)     HCT 34.3 (*)     All other components within normal limits  POCT I-STAT TROPONIN I  INFLUENZA PANEL BY PCR  CBC  CREATININE, SERUM  BLOOD GAS, ARTERIAL  BASIC METABOLIC PANEL  CBC   Dg Chest 2 View (if Patient Has Fever And/or Copd)  04/27/2012  *RADIOLOGY REPORT*  Clinical Data: Fever, chills, body  CHEST - 2 VIEW  Comparison: 03/29/2012  Findings: Cardiomediastinal silhouette is stable.  Mild hyperinflation again noted.  Stable chronic mild interstitial prominence.  Again noted tenting of the left posterior diaphragm. Left humeral prosthesis again noted.  No focal infiltrate or pulmonary edema.  IMPRESSION: No active disease.  Hyperinflation again noted.  Stable tenting of the left hemidiaphragm.   Original Report Authenticated By: Natasha Mead, M.D.    1. COPD  exacerbation   2. Flu-like symptoms   3. GERD (gastroesophageal reflux disease)   4. HTN (hypertension)   5. Hyperlipemia       MDM  [name] is a 78 y.o. female complaining with body aches, and overall flulike syndrome - think this is triggered a COPD exacerbation. She's been using her nebulizer frequently, is using her 3 L at home however she Riso short of breath and has a cough that is nonproductive. She is having some subjective fevers and chills. Patients treated with Solu-Medrol as well as nebs to open her up-patient Odaniel wheezing and moving very small amount of air. Cardiac and the patient on 3 L and she desaturated almost immediately out about 3 or 4 steps to 84%.  Lab patient admitted for COPD exacerbation. Do not think she's got a pneumonia this time. In discussion with hospitalist-will screen the patient for influenza - patient placed in droplet precautions due to to the same. Patient did receive a shot this year.        Jones Skene, MD 04/27/12 1734

## 2012-04-28 LAB — CBC
HCT: 33.3 % — ABNORMAL LOW (ref 36.0–46.0)
MCV: 90.7 fL (ref 78.0–100.0)
Platelets: 254 10*3/uL (ref 150–400)
RBC: 3.67 MIL/uL — ABNORMAL LOW (ref 3.87–5.11)
WBC: 3.3 10*3/uL — ABNORMAL LOW (ref 4.0–10.5)

## 2012-04-28 LAB — BASIC METABOLIC PANEL
CO2: 27 mEq/L (ref 19–32)
Chloride: 100 mEq/L (ref 96–112)
Potassium: 3.2 mEq/L — ABNORMAL LOW (ref 3.5–5.1)
Sodium: 140 mEq/L (ref 135–145)

## 2012-04-28 MED ORDER — ALPRAZOLAM 0.5 MG PO TABS
0.5000 mg | ORAL_TABLET | Freq: Three times a day (TID) | ORAL | Status: DC | PRN
  Administered 2012-04-28 – 2012-04-30 (×6): 0.5 mg via ORAL
  Filled 2012-04-28 (×7): qty 1

## 2012-04-28 MED ORDER — POTASSIUM CHLORIDE CRYS ER 20 MEQ PO TBCR
40.0000 meq | EXTENDED_RELEASE_TABLET | ORAL | Status: AC
  Administered 2012-04-28 (×2): 40 meq via ORAL
  Filled 2012-04-28 (×2): qty 2

## 2012-04-28 NOTE — Progress Notes (Signed)
Ambulated pt in hallway about 80 feet. Pt tolerated ambulation well but did become dyspneic and tired towards the end of the walk. Pt's oxygen sats maintained from 92-96% on 3L oxygen, except for about a minute they dropped to 88% and HR was 150. Oxygen sats increased quickly with breathing and HR decreased to 130's. Pt returned to bed and HR continued to decrease. Will continue to monitor pt.   Arta Bruce [facility]

## 2012-04-28 NOTE — Care Management Note (Signed)
    Page 1 of 2   04/30/2012     2:00:18 PM   CARE MANAGEMENT NOTE 04/30/2012  Patient:  [name], [name]   Account Number:  [id number]  Date Initiated:  04/28/2012  Documentation initiated by:  [facility]  Subjective/Objective Assessment:   ADMITTED W/COPD.     Action/Plan:   FROM HOME W/SPOUSE.HAS HOME 02.   Anticipated DC Date:  04/30/2012   Anticipated DC Plan:  HOME W HOME HEALTH SERVICES      DC Planning Services  CM consult      Choice offered to / List presented to:  C-1 Patient   DME arranged  WALKER - ROLLING  WHEELCHAIR - MANUAL  SHOWER STOOL  TUB BENCH      DME agency  [facility]     HH arranged  HH-1 RN  HH-2 PT  HH-3 OT      [facility] agency  [facility]   Status of service:  Completed, signed off Medicare Important Message given?   (If response is "NO", the following Medicare IM given date fields will be blank) Date Medicare IM given:   Date Additional Medicare IM given:    Discharge Disposition:  HOME W HOME HEALTH SERVICES  Per UR Regulation:  Reviewed for med. necessity/level of care/duration of stay  If discussed at Long Length of Stay Meetings, dates discussed:    Comments:  04/30/12 Benicio Manna RN,BSN NCM [phone] [facility] CHOSEN FOR HH.[facility] RECEIVED FAXED DME ORDERS,H&P,D/C SUMMARY W/CONFIRMATION.[facility] RECOMMENDED FAMILY TO STOP BY OFFICE IN [place] TO PICK UP DME.INFORMED PATIENT WHO SAYS HER SON WILL BE ABLE TO PICK UP DME.  04/28/12 Dontario Evetts RN,BSN NCM [phone] HAS PCP,PHARMACY,HOME 02.AWAIT PT RECOMMENDATIONS.

## 2012-04-28 NOTE — Progress Notes (Signed)
While bathing, pt's HR briefly sustained at 154. Pt dyspneic with respirations at 34 during this time of activity. Pt asymptomatic and had no complaints at this time. HR returned to 109 with a brief resting period. MD made aware and no new orders received at this time. Will continue to monitor pt.  Arta Bruce [facility]

## 2012-04-28 NOTE — Progress Notes (Signed)
Patient seen and examined by me.  Patient has been on 10 mg prednisone daily but did not think that helped her.  She has also been given steroid shots by Dr. Delford Field and that seemed to improve her symptoms.  Patient is very anxious and stressed about husband's impending surgery and his debility as he does all the grocery shopping for the family.  Marlin Canary DO

## 2012-04-28 NOTE — Progress Notes (Signed)
[facility] PROGRESS NOTE  [name] ZOX:[id number] DOB: Nov 24, 1930 DOA: 04/27/2012 PCP: [name], MD  Assessment/Plan:  COPD (chronic obstructive pulmonary disease)  Slight improvement. Reports not at baseline yet. Very poor air movement. Very faint end expiratory wheeze upper left lobe. No rhonchi. Continue  Solu-Medrol and by mouth doxycycline as she is allergic to his azithromycin and also Levaquin. Flu test negative. ABG with 02 at 75 and c02 at 45 both marginally worse than baseline per chart review. Reports seeing pulmonology NP 3 weeks ago due to worsening SOB and she was given solumedrol shot and given prednisone taper. She reports getting better during that time. The taper ended 1 week ago.    GERD (gastroesophageal reflux disease)  Continue her PPI   HTN (hypertension)   controlled. Continue her home antihypertensive medications. Hold for systolic less than 110   Hyperlipemia  Continue her cholesterol medication   Anxiety: worse than usual. Pt husband with recurrent cancer and having kidney removed soon, pt concerned over ability to care for him. Discussed relaxation breathing/pursed lip breathing. Will provide prn xanax.  Code Status: DNR Family Communication: husband at bedside Disposition Plan: home when ready. Hopefully tomorrow   Consultants:  none  Procedures:  none  Antibiotics:  Doxy 04/27/12 >>>>  HPI/Subjective: Awake alert somewhat anxious appearing, teary. Denies pain/discomfort. Reports slight improvement with breathing.   Objective: Filed Vitals:   04/27/12 1717 04/27/12 2204 04/28/12 0445 04/28/12 0755  BP: 146/72 119/61 169/84   Pulse: 101 83 113   Temp: 97.4 F (36.3 C) 97.6 F (36.4 C) 97.6 F (36.4 C)   TempSrc: Oral Oral Oral   Resp: 20 18 18    Height: 5' (1.524 m)     Weight: 62.7 kg (138 lb 3.7 oz)  62.869 kg (138 lb 9.6 oz)   SpO2: 100% 98% 99% 99%    Intake/Output Summary (Last 24 hours) at 04/28/12 1004 Last  data filed at 04/28/12 0600  Gross per 24 hour  Intake    360 ml  Output    600 ml  Net   -240 ml   Filed Weights   04/27/12 1717 04/28/12 0445  Weight: 62.7 kg (138 lb 3.7 oz) 62.869 kg (138 lb 9.6 oz)    Exam:   General:  Awake alert NAD  Cardiovascular: RRR No MGR No LEE PPP  Respiratory: moderate increased work of breathing. Very poor air movement. Very faint expiratory wheeze on upper left  Abdomen: soft +Bs non-tender to palpation  Data Reviewed: Basic Metabolic Panel:  Lab 04/28/12 8119 04/27/12 1130  NA 140 138  K 3.2* 3.5  CL 100 98  CO2 27 28  GLUCOSE 201* 99  BUN 21 22  CREATININE 0.58 0.62  CALCIUM 9.6 9.6  MG -- --  PHOS -- --   Liver Function Tests: No results found for this basename: AST:5,ALT:5,ALKPHOS:5,BILITOT:5,PROT:5,ALBUMIN:5 in the last 168 hours No results found for this basename: LIPASE:5,AMYLASE:5 in the last 168 hours No results found for this basename: AMMONIA:5 in the last 168 hours CBC:  Lab 04/28/12 0505 04/27/12 1130  WBC 3.3* 5.6  NEUTROABS -- --  HGB 10.8* 10.9*  HCT 33.3* 34.3*  MCV 90.7 93.0  PLT 254 244   Cardiac Enzymes: No results found for this basename: CKTOTAL:5,CKMB:5,CKMBINDEX:5,TROPONINI:5 in the last 168 hours BNP (last 3 results) No results found for this basename: PROBNP:3 in the last 8760 hours CBG: No results found for this basename: GLUCAP:5 in the last 168 hours  No  results found for this or any previous visit (from the past 240 hour(s)).   Studies: Dg Chest 2 View (if Patient Has Fever And/or Copd)  04/27/2012  *RADIOLOGY REPORT*  Clinical Data: Fever, chills, body  CHEST - 2 VIEW  Comparison: 03/29/2012  Findings: Cardiomediastinal silhouette is stable.  Mild hyperinflation again noted.  Stable chronic mild interstitial prominence.  Again noted tenting of the left posterior diaphragm. Left humeral prosthesis again noted.  No focal infiltrate or pulmonary edema.  IMPRESSION: No active disease.   Hyperinflation again noted.  Stable tenting of the left hemidiaphragm.   Original Report Authenticated By: Natasha Mead, M.D.     Scheduled Meds:   . [COMPLETED] acetaminophen  650 mg Oral Once  . ipratropium  0.5 mg Nebulization QID   And  . albuterol  2.5 mg Nebulization Q4H WA  . [COMPLETED] albuterol  5 mg Nebulization Once  . atorvastatin  20 mg Oral q1800  . docusate sodium  100 mg Oral BID  . doxycycline  100 mg Oral Q12H  . enoxaparin (LOVENOX) injection  40 mg Subcutaneous Q24H  . losartan  100 mg Oral Daily  . [COMPLETED] methylPREDNISolone (SOLU-MEDROL) injection  125 mg Intravenous Once  . methylPREDNISolone (SOLU-MEDROL) injection  80 mg Intravenous Q6H  . pantoprazole  40 mg Oral Daily  . potassium chloride  40 mEq Oral Q4H  . [DISCONTINUED] albuterol  5 mg Nebulization Q4H  . [DISCONTINUED] albuterol  5 mg Nebulization Q4H  . [DISCONTINUED] ipratropium  0.5 mg Nebulization Q4H  . [DISCONTINUED] ipratropium-albuterol  3 mL Nebulization QID  . [DISCONTINUED] losartan  100 mg Oral Daily  . [DISCONTINUED] pantoprazole  40 mg Oral Daily   Continuous Infusions:   . sodium chloride      Active Problems:  COPD (chronic obstructive pulmonary disease)  GERD (gastroesophageal reflux disease)  HTN (hypertension)  Hyperlipemia    Time spent: 40 minutes   [facility] M NP [facility]  If 8PM-8AM, please contact night-coverage at [email or web address], password [facility] 04/28/2012, 10:04 AM  LOS: 1 day

## 2012-04-29 DIAGNOSIS — J9610 Chronic respiratory failure, unspecified whether with hypoxia or hypercapnia: Secondary | ICD-10-CM

## 2012-04-29 LAB — CBC
HCT: 33 % — ABNORMAL LOW (ref 36.0–46.0)
MCV: 91.7 fL (ref 78.0–100.0)
RDW: 12.2 % (ref 11.5–15.5)
WBC: 10.8 10*3/uL — ABNORMAL HIGH (ref 4.0–10.5)

## 2012-04-29 LAB — BASIC METABOLIC PANEL
BUN: 30 mg/dL — ABNORMAL HIGH (ref 6–23)
CO2: 27 mEq/L (ref 19–32)
Chloride: 105 mEq/L (ref 96–112)
Creatinine, Ser: 0.72 mg/dL (ref 0.50–1.10)

## 2012-04-29 MED ORDER — METHYLPREDNISOLONE SODIUM SUCC 125 MG IJ SOLR
60.0000 mg | Freq: Four times a day (QID) | INTRAMUSCULAR | Status: DC
  Administered 2012-04-29 – 2012-04-30 (×4): 60 mg via INTRAVENOUS
  Filled 2012-04-29 (×8): qty 0.96

## 2012-04-29 MED ORDER — PANTOPRAZOLE SODIUM 40 MG PO TBEC
40.0000 mg | DELAYED_RELEASE_TABLET | Freq: Two times a day (BID) | ORAL | Status: DC
  Administered 2012-04-29 – 2012-04-30 (×2): 40 mg via ORAL
  Filled 2012-04-29 (×3): qty 1

## 2012-04-29 MED ORDER — HYDROCOD POLST-CHLORPHEN POLST 10-8 MG/5ML PO LQCR
5.0000 mL | Freq: Two times a day (BID) | ORAL | Status: DC | PRN
  Administered 2012-04-29 – 2012-04-30 (×3): 5 mL via ORAL
  Filled 2012-04-29 (×3): qty 5

## 2012-04-29 MED ORDER — ALBUTEROL SULFATE (5 MG/ML) 0.5% IN NEBU: 2.5000 mg | INHALATION_SOLUTION | RESPIRATORY_TRACT | Status: DC | PRN

## 2012-04-29 MED ORDER — IPRATROPIUM BROMIDE 0.02 % IN SOLN: 0.5000 mg | RESPIRATORY_TRACT | Status: DC | PRN

## 2012-04-29 NOTE — Progress Notes (Signed)
[facility] PROGRESS NOTE  [name] WUJ:[id number] DOB: 03-12-1931 DOA: 04/27/2012 PCP: [name], MD  Assessment/Plan: COPD (chronic obstructive pulmonary disease)  Improved air movement. BS coarse with rhonchi/wheeze. Coughing thick sputum. Will provide prn and scheduled nebs. Continue Solu-Medrol but decrease dose to 60mg  and by mouth doxycycline day #3. Maintained sats with walk yesterday on baseline 3L. Feeling weak from constant coughing. Will provide tussinex.    GERD (gastroesophageal reflux disease)  Continue her PPI but increase to BID as pt complains of "burning"  HTN (hypertension)  controlled. Continue her home antihypertensive medications. Hold for systolic less than 110   Hyperlipemia  Continue her cholesterol medication   Anxiety:  Remains anxious which worsens respiratory status.  Discussed relaxation breathing/pursed lip breathing. Will provide prn xanax and discuss with RN's to assess for anxiety.    Code Status: DNR Family Communication: husband at bedside Disposition Plan: home when medically stable. Hopefully tomorrow   Consultants:  none  Procedures:  none  Antibiotics: Doxy 04/27/12 >>>>   HPI/Subjective: Awake alert anxious. Face slightly flushed.   Objective: Filed Vitals:   04/28/12 2106 04/28/12 2130 04/29/12 0506 04/29/12 0750  BP:  142/81 166/91   Pulse: 93 99 86   Temp:  98.6 F (37 C) 97.5 F (36.4 C)   TempSrc:  Oral Oral   Resp: 20 20 20    Height:      Weight:   63.05 kg (139 lb)   SpO2: 99% 99% 95% 94%    Intake/Output Summary (Last 24 hours) at 04/29/12 0944 Last data filed at 04/28/12 1731  Gross per 24 hour  Intake    300 ml  Output    600 ml  Net   -300 ml   Filed Weights   04/27/12 1717 04/28/12 0445 04/29/12 0506  Weight: 62.7 kg (138 lb 3.7 oz) 62.869 kg (138 lb 9.6 oz) 63.05 kg (139 lb)    Exam:   General:  Awake alert somewhat anxous  Cardiovascular: RRR No MGR No LEE  Respiratory:  moderate increased work of breathing. Improved air movement. BS with diffuse rhonchi/wheeze/tightness  Abdomen: soft +BS non-tender to palpation  Data Reviewed: Basic Metabolic Panel:  Lab 04/29/12 9562 04/28/12 0505 04/27/12 1130  NA 143 140 138  K 4.3 3.2* 3.5  CL 105 100 98  CO2 27 27 28   GLUCOSE 141* 201* 99  BUN 30* 21 22  CREATININE 0.72 0.58 0.62  CALCIUM 10.1 9.6 9.6  MG -- -- --  PHOS -- -- --   Liver Function Tests: No results found for this basename: AST:5,ALT:5,ALKPHOS:5,BILITOT:5,PROT:5,ALBUMIN:5 in the last 168 hours No results found for this basename: LIPASE:5,AMYLASE:5 in the last 168 hours No results found for this basename: AMMONIA:5 in the last 168 hours CBC:  Lab 04/28/12 0505 04/27/12 1130  WBC 3.3* 5.6  NEUTROABS -- --  HGB 10.8* 10.9*  HCT 33.3* 34.3*  MCV 90.7 93.0  PLT 254 244   Cardiac Enzymes: No results found for this basename: CKTOTAL:5,CKMB:5,CKMBINDEX:5,TROPONINI:5 in the last 168 hours BNP (last 3 results) No results found for this basename: PROBNP:3 in the last 8760 hours CBG: No results found for this basename: GLUCAP:5 in the last 168 hours  No results found for this or any previous visit (from the past 240 hour(s)).   Studies: Dg Chest 2 View (if Patient Has Fever And/or Copd)  04/27/2012  *RADIOLOGY REPORT*  Clinical Data: Fever, chills, body  CHEST - 2 VIEW  Comparison: 03/29/2012  Findings: Cardiomediastinal  silhouette is stable.  Mild hyperinflation again noted.  Stable chronic mild interstitial prominence.  Again noted tenting of the left posterior diaphragm. Left humeral prosthesis again noted.  No focal infiltrate or pulmonary edema.  IMPRESSION: No active disease.  Hyperinflation again noted.  Stable tenting of the left hemidiaphragm.   Original Report Authenticated By: Natasha Mead, M.D.     Scheduled Meds:   . ipratropium  0.5 mg Nebulization QID   And  . albuterol  2.5 mg Nebulization Q4H WA  . atorvastatin  20 mg Oral  q1800  . docusate sodium  100 mg Oral BID  . doxycycline  100 mg Oral Q12H  . enoxaparin (LOVENOX) injection  40 mg Subcutaneous Q24H  . losartan  100 mg Oral Daily  . methylPREDNISolone (SOLU-MEDROL) injection  60 mg Intravenous Q6H  . pantoprazole  40 mg Oral Daily  . [COMPLETED] potassium chloride  40 mEq Oral Q4H  . [DISCONTINUED] methylPREDNISolone (SOLU-MEDROL) injection  80 mg Intravenous Q6H   Continuous Infusions:   . sodium chloride      Active Problems:  COPD (chronic obstructive pulmonary disease)  GERD (gastroesophageal reflux disease)  HTN (hypertension)  Hyperlipemia    Time spent: 30 minutes    [facility] M  [facility]  If 8PM-8AM, please contact night-coverage at [email or web address], password [facility] 04/29/2012, 9:44 AM  LOS: 2 days

## 2012-04-29 NOTE — Evaluation (Signed)
Physical Therapy Evaluation Patient Details Name: [name] MRN: [id number] DOB: 1930-11-14 Today's Date: 04/29/2012 Time: 4098-1191 PT Time Calculation (min): 26 min  PT Assessment / Plan / Recommendation Clinical Impression  78 y.o. female admitted with COPD exacerbation. Pt ambulated 95' with RW, SaO2 dropped to 77% on 3L O2, HR 135. Pt would benefit from acute PT to maximize safety and independence with mobility. WC, shower stool, RW recommended for home for energy conservation.     PT Assessment  Patient needs continued PT services    Follow Up Recommendations  Home health PT    Does the patient have the potential to tolerate intense rehabilitation      Barriers to Discharge        Equipment Recommendations  Tub/shower seat;Rolling walker with 5" wheels;Wheelchair cushion (measurements)    Recommendations for Other Services OT consult   Frequency Min 3X/week    Precautions / Restrictions Precautions Precaution Comments: monitor O2 sats   Pertinent Vitals/Pain *SaO2 77% on 3L O2 with walking, HR 135 RN notified**      Mobility  Bed Mobility Bed Mobility: Supine to Sit Supine to Sit: 6: Modified independent (Device/Increase time);HOB elevated Transfers Transfers: Sit to Stand;Stand to Sit Sit to Stand: 5: Supervision;From bed;With upper extremity assist Stand to Sit: 5: Supervision;To chair/3-in-1;With upper extremity assist;With armrests Ambulation/Gait Ambulation/Gait Assistance: 4: Min guard Ambulation Distance (Feet): 95 Feet Assistive device: Rolling walker Gait Pattern: Decreased step length - right;Decreased step length - left;Within Functional Limits General Gait Details: SaO2 dropped to 77% on 3L O2 [place], HR up to 135 while walking; SaO2 97% on 3L after 2 min rest, HR 122    Shoulder Instructions     Exercises     PT Diagnosis: Difficulty walking  PT Problem List: Decreased activity tolerance;Cardiopulmonary status limiting activity PT  Treatment Interventions: Gait training;Stair training;Therapeutic exercise;Functional mobility training   PT Goals Acute Rehab PT Goals PT Goal Formulation: With patient Time For Goal Achievement: 04/29/12 Potential to Achieve Goals: Fair Pt will Ambulate: 51 - 150 feet;with modified independence;with rolling walker PT Goal: Ambulate - Progress: Goal set today Pt will Go Up / Down Stairs: 3-5 stairs;with min assist;with rail(s) PT Goal: Up/Down Stairs - Progress: Goal set today Pt will Perform Home Exercise Program: Independently PT Goal: Perform Home Exercise Program - Progress: Goal set today  Visit Information  Last PT Received On: 04/29/12 Assistance Needed: +1 Reason Eval/Treat Not Completed: Pain limiting ability to participate (pt has bad headache, RN notified, will re-attempt later toda)    Subjective Data  Subjective: Can I walk again later today? Patient Stated Goal: to get stronger   Prior Functioning  Home Living Lives With: Spouse Available Help at Discharge: Family Type of Home: House Home Access: Stairs to enter [employer] of Steps: 4 Entrance Stairs-Rails: Right Home Layout: One level Home Adaptive Equipment: Walker - standard;Bedside commode/3-in-1 Prior Function Level of Independence: Independent with assistive device(s) Able to Take Stairs?: Yes Communication Communication: No difficulties    Cognition  Overall Cognitive Status: Appears within functional limits for tasks assessed/performed Arousal/Alertness: Awake/alert Orientation Level: Appears intact for tasks assessed Behavior During Session: [facility] for tasks performed    Extremity/Trunk Assessment Right Upper Extremity Assessment RUE ROM/Strength/Tone: [facility] for tasks assessed Left Upper Extremity Assessment LUE ROM/Strength/Tone: WFL for tasks assessed Right Lower Extremity Assessment RLE ROM/Strength/Tone: Within functional levels RLE Sensation: WFL - Light Touch RLE Coordination:  WFL - gross/fine motor Left Lower Extremity Assessment LLE ROM/Strength/Tone: Within functional  levels LLE Sensation: WFL - Light Touch LLE Coordination: WFL - gross/fine motor Trunk Assessment Trunk Assessment: Normal   Balance Balance Balance Assessed: Yes Static Sitting Balance Static Sitting - Balance Support: No upper extremity supported;Feet supported Static Sitting - Level of Assistance: 6: Modified independent (Device/Increase time) Static Sitting - Comment/# of Minutes: 2  End of Session PT - End of Session Equipment Utilized During Treatment: Oxygen Activity Tolerance: Treatment limited secondary to medical complications (Comment);Patient limited by fatigue (SaO2 77% on 3L O2 walking, HR up to 135) Patient left: in chair;with call bell/phone within reach;with family/visitor present Nurse Communication: Mobility status  GP     [name] 04/29/2012, 2:06 PM  [phone]

## 2012-04-29 NOTE — Progress Notes (Signed)
Patient seen and examined by me.  Patient needs another day but seems to be improving.  Started a benzo for anxiety. Hope for d/c tomm  Marlin Canary DO [phone]

## 2012-04-29 NOTE — Progress Notes (Signed)
PT Cancellation Note  Patient Details Name: [name] MRN: [id number] DOB: Jan 21, 1931   Cancelled Treatment:    Pt not feeling well, she had a bad headache. Will re-attempt later today.    [name] 04/29/2012, 10:53 AM [phone]

## 2012-04-30 DIAGNOSIS — I714 Abdominal aortic aneurysm, without rupture: Secondary | ICD-10-CM

## 2012-04-30 LAB — CBC
Hemoglobin: 10.5 g/dL — ABNORMAL LOW (ref 12.0–15.0)
MCH: 29.1 pg (ref 26.0–34.0)
MCHC: 31.8 g/dL (ref 30.0–36.0)
Platelets: 289 10*3/uL (ref 150–400)
RDW: 12.3 % (ref 11.5–15.5)

## 2012-04-30 LAB — BASIC METABOLIC PANEL
Calcium: 9.8 mg/dL (ref 8.4–10.5)
GFR calc Af Amer: >90 mL/min (ref >90)
GFR calc non Af Amer: 81 mL/min — ABNORMAL LOW (ref >90)
Glucose, Bld: 163 mg/dL — ABNORMAL HIGH (ref 70–99)
Potassium: 3.8 mEq/L (ref 3.5–5.1)
Sodium: 140 mEq/L (ref 135–145)

## 2012-04-30 MED ORDER — DOXYCYCLINE HYCLATE 100 MG PO TABS: 100.0000 mg | ORAL_TABLET | Freq: Two times a day (BID) | ORAL | Status: DC

## 2012-04-30 MED ORDER — ALPRAZOLAM 0.5 MG PO TABS: 0.5000 mg | ORAL_TABLET | Freq: Three times a day (TID) | ORAL | Status: DC | PRN

## 2012-04-30 MED ORDER — DSS 100 MG PO CAPS: 100.0000 mg | ORAL_CAPSULE | Freq: Two times a day (BID) | ORAL | Status: DC

## 2012-04-30 MED ORDER — ALBUTEROL SULFATE (5 MG/ML) 0.5% IN NEBU: 2.5000 mg | INHALATION_SOLUTION | RESPIRATORY_TRACT | Status: DC | PRN

## 2012-04-30 MED ORDER — PREDNISONE 10 MG PO TABS: ORAL_TABLET | ORAL | Status: DC

## 2012-04-30 MED ORDER — HYDROCOD POLST-CHLORPHEN POLST 10-8 MG/5ML PO LQCR: 5.0000 mL | Freq: Two times a day (BID) | ORAL | Status: DC | PRN

## 2012-04-30 NOTE — Discharge Summary (Signed)
PAtient seen and examined by me.  Plan to d/c patient home with close pulm follow up.  She is probably steroid dependant at this time and we will taper slowly to 10 mg and continue until seen by pulm.  Marlin Canary DO

## 2012-04-30 NOTE — Discharge Summary (Signed)
Physician Discharge Summary  [name] Grounds OZH:[id number] DOB: 03-29-1931 DOA: 04/27/2012  PCP: Amanda Mannan, MD  Admit date: 04/27/2012 Discharge date: 04/30/2012  Time spent: 50 minutes  Recommendations for Outpatient Follow-up:  1. Home with HH PT/OT/RN.  2. Has appointment with Pulmonary NP 05/10/12 at 3:00pm 3. Will call PCP for appointment in 1 week.   Discharge Diagnoses:  Active Problems:  COPD (chronic obstructive pulmonary disease)  GERD (gastroesophageal reflux disease)  HTN (hypertension)  Hyperlipemia   Discharge Condition: Medically stable and ready for discharge to home  Diet recommendation: heart healthy  Filed Weights   04/28/12 0445 04/29/12 0506 04/30/12 0523  Weight: 62.869 kg (138 lb 9.6 oz) 63.05 kg (139 lb) 63.3 kg (139 lb 8.8 oz)    History of present illness:  Patient is a 78 YO WHITE female with past medical history significant for COPD she is O2-dependent on 3 L of nasal cannula at home presented to ED on 04/27/12 with worsening SOB. Patient stated that for previous 6 days she had not been  feeling well. She complained of diffuse body ache all over. She also complained of chills and subjective fever. She stateed that she did not feel like she was more short of breath than usual. She reported that she had not been coughing did not feel congested. She did not have any nausea or vomiting. In the emergency room she had a chest x-ray done that did not show any pneumonia but on standing the patient up with her oxygen at 3 L her oxygen fell into the low 80s. We were asked to admit patient for possible COPD exacerbation   Hospital Course:  COPD (chronic obstructive pulmonary disease) admitted to telemetry and started on IV steroids, nebulizer, and antibiotic. Very poor air movement initially. Gradually improved air movement with worsening cough and increased rhonchi/wheeze. Provided tussinex and began to taper solumedrol. Ambulated in hall on 3L and sat decreased  to 77%. Also became very anxious which made breathing worse. Started on prn xanax with good results. Pt gradually improved. At time of discharge maintaining sats >90 on baseline 3L. Respiratory effort back to baseline. Will discharge with doxy for total 7 days, a very slow prednisone taper to end with 10mg  daily to be continued until re-evaluated by pulmonary on 05/10/12. Will continue home nebs and home o2.    GERD (gastroesophageal reflux disease) stable during this hospitalization.  HTN (hypertension) Remained controlled during this hospitalization   Hyperlipemia : stable during hospitalization. Continue her cholesterol medication   Anxiety: pt with worsening anxiety during this hospitalization. Very anxious about husbands illness and how she will provide care. This hindered patient ability to use the pursed lip breathing discussed. Her sats would drop. Started xanax 0.5mg  q8hr prn for anxiety as episodes of anxiety worsen respiratory status.  Will discharge on same as this proved to be pertinent factor in her treatment.    Procedures:  none  Consultations:  none  Discharge Exam: Filed Vitals:   04/30/12 0006 04/30/12 0420 04/30/12 0523 04/30/12 0812  BP:   161/82   Pulse:   115   Temp:   97.7 F (36.5 C)   TempSrc:   Oral   Resp:   20   Height:      Weight:   63.3 kg (139 lb 8.8 oz)   SpO2: 96% 96% 100% 100%    General: awake alert oriented x3 Cardiovascular: rrr No MGR Respiratory: Normal effort. BS with improved air movement. No wheeze diffuse  rhonchi  Discharge Instructions  Discharge Orders    Future Appointments: Provider: Department: Dept Phone: Center:   05/10/2012 3:15 PM [name], NP [facility] [phone] None   03/01/2013 11:45 AM [name], MD [facility] [phone] [facility]     Future Orders Please Complete By Expires   Diet - low sodium heart healthy      Increase activity slowly      Call MD for:   difficulty breathing, headache or visual disturbances      Call MD for:  persistant dizziness or light-headedness          Medication List     As of 04/30/2012 10:40 AM    TAKE these medications         albuterol (5 MG/ML) 0.5% nebulizer solution   Commonly known as: PROVENTIL   Take 0.5 mLs (2.5 mg total) by nebulization every 2 (two) hours as needed for wheezing or shortness of breath.      ALPRAZolam 0.5 MG tablet   Commonly known as: XANAX   Take 1 tablet (0.5 mg total) by mouth 3 (three) times daily as needed for anxiety or sleep.      chlorpheniramine-HYDROcodone 10-8 MG/5ML Lqcr   Commonly known as: TUSSIONEX   Take 5 mLs by mouth every 12 (twelve) hours as needed.      doxycycline 100 MG tablet   Commonly known as: VIBRA-TABS   Take 1 tablet (100 mg total) by mouth every 12 (twelve) hours.      DSS 100 MG Caps   Take 100 mg by mouth 2 (two) times daily.      esomeprazole 40 MG capsule   Commonly known as: NEXIUM   Take 40 mg by mouth daily before breakfast.      furosemide 20 MG tablet   Commonly known as: LASIX   Take 20 mg by mouth daily as needed.      GARLIQUE 400 MG Tbec   Generic drug: Garlic   Take 1 tablet by mouth daily.      GOODY HEADACHE PO   Take 1 packet by mouth every 4 (four) hours as needed. pain      HYDROcodone-acetaminophen 5-500 MG per tablet   Commonly known as: VICODIN   Take 1 tablet by mouth every 6 (six) hours as needed. pain      ipratropium-albuterol 0.5-2.5 (3) MG/3ML Soln   Commonly known as: DUONEB   Take 3 mLs by nebulization 4 (four) times daily.      losartan 100 MG tablet   Commonly known as: COZAAR   Take 100 mg by mouth daily.      potassium chloride 10 MEQ CR capsule   Commonly known as: MICRO-K   Take 10 mEq by mouth daily as needed.      predniSONE 10 MG tablet   Commonly known as: DELTASONE   Take 6 tabs for 4 days (11/16-11/19) Take 4 tabs for 4 days (11/20-11/23) take 2 tabs for 4 days (11/24-11/27) then  take 1 tab daily.      rosuvastatin 10 MG tablet   Commonly known as: CRESTOR   Take 10 mg by mouth daily.           Follow-up Information    Follow up with Amanda Mannan, MD.   Contact information:   [address] [place], [place] [address] [phone]       Follow up with [facility]  Dayton Martes, MD. Schedule an appointment as soon as possible for a visit in 1 week.   Contact information:   [address] [place] [address] [place], [place] [address] [phone]       Follow up with [facility], NP. On 05/10/2012. (has appointment 3:00pm)    Contact information:   [facility] [address] [place] [address] [phone]           The results of significant diagnostics from this hospitalization (including imaging, microbiology, ancillary and laboratory) are listed below for reference.    Significant Diagnostic Studies: Dg Chest 2 View (if Patient Has Fever And/or Copd)  04/27/2012  *RADIOLOGY REPORT*  Clinical Data: Fever, chills, body  CHEST - 2 VIEW  Comparison: 03/29/2012  Findings: Cardiomediastinal silhouette is stable.  Mild hyperinflation again noted.  Stable chronic mild interstitial prominence.  Again noted tenting of the left posterior diaphragm. Left humeral prosthesis again noted.  No focal infiltrate or pulmonary edema.  IMPRESSION: No active disease.  Hyperinflation again noted.  Stable tenting of the left hemidiaphragm.   Original Report Authenticated By: Natasha Mead, M.D.     Microbiology: No results found for this or any previous visit (from the past 240 hour(s)).   Labs: Basic Metabolic Panel:  Lab 04/30/12 2952 04/29/12 0527 04/28/12 0505 04/27/12 1130  NA 140 143 140 138  K 3.8 4.3 3.2* 3.5  CL 102 105 100 98  CO2 28 27 27 28   GLUCOSE 163* 141* 201* 99  BUN 31* 30* 21 22  CREATININE 0.69 0.72 0.58 0.62  CALCIUM 9.8 10.1 9.6 9.6  MG -- -- -- --  PHOS -- -- -- --   Liver Function Tests: No  results found for this basename: AST:5,ALT:5,ALKPHOS:5,BILITOT:5,PROT:5,ALBUMIN:5 in the last 168 hours No results found for this basename: LIPASE:5,AMYLASE:5 in the last 168 hours No results found for this basename: AMMONIA:5 in the last 168 hours CBC:  Lab 04/30/12 0458 04/29/12 0527 04/28/12 0505 04/27/12 1130  WBC 9.8 10.8* 3.3* 5.6  NEUTROABS -- -- -- --  HGB 10.5* 10.8* 10.8* 10.9*  HCT 33.0* 33.0* 33.3* 34.3*  MCV 91.4 91.7 90.7 93.0  PLT 289 313 254 244   Cardiac Enzymes: No results found for this basename: CKTOTAL:5,CKMB:5,CKMBINDEX:5,TROPONINI:5 in the last 168 hours BNP: BNP (last 3 results) No results found for this basename: PROBNP:3 in the last 8760 hours CBG: No results found for this basename: GLUCAP:5 in the last 168 hours     Signed:  Gwenyth Bender NP [facility] 04/30/2012, 10:40 AM

## 2012-05-07 ENCOUNTER — Other Ambulatory Visit: Payer: Self-pay | Admitting: Critical Care Medicine

## 2012-05-07 NOTE — Telephone Encounter (Signed)
Lasix 20 mg rx last sent on 07/30/11 for #10 as take 1 po qd.  Per instructions from OV on 07/30/11, pt was to take lasix and K x 10 days then stop.  I see no mention that pt was to cont with these medications.  However, Lasix 20 mg is on pt's current med list for qd prn.  Dr. Delford Field, pls advise if ok to refill lasix or if you would prefer to defer to PCP to manage.  Pls advise.  Thank you.

## 2012-05-10 ENCOUNTER — Encounter: Payer: Self-pay | Admitting: Adult Health

## 2012-05-10 ENCOUNTER — Ambulatory Visit (INDEPENDENT_AMBULATORY_CARE_PROVIDER_SITE_OTHER): Payer: Medicare HMO | Admitting: Adult Health

## 2012-05-10 VITALS — BP 142/72 | HR 95 | Temp 97.6°F | Ht 60.0 in | Wt 140.0 lb

## 2012-05-10 DIAGNOSIS — J449 Chronic obstructive pulmonary disease, unspecified: Secondary | ICD-10-CM

## 2012-05-10 MED ORDER — BECLOMETHASONE DIPROPIONATE 80 MCG/ACT IN AERS: 2.0000 | INHALATION_SPRAY | Freq: Two times a day (BID) | RESPIRATORY_TRACT | Status: DC

## 2012-05-10 MED ORDER — AEROCHAMBER MV MISC: Status: DC

## 2012-05-10 NOTE — Progress Notes (Signed)
  Subjective:    Patient ID: [name], female    DOB: 1931/03/16, 78 y.o.   MRN: [id number]  HPI  78 y.o. WF Copd IV Golds. Oxygen dependent.  Adm 2/13   Adm 11/13   05/10/2012 Post hospital follow up  Patient returns for a post hospital followup. Admitted November 12 - November 15, for acute COPD, exacerbation. She was treated with IV antibiotics, steroids, and nebulized bronchodilators. She was discharged on a total of 7 days of doxycycline along with a prednisone taper Currently, she is on prednisone 20mg  daily  Since discharge. Patient reports she is improved with decreased cough, shortness of breath. She feels that she is near her baseline. O2 saturations are adequate today on 4 L of pulse and oxygen      Past Medical History  Diagnosis Date  . History of colonic polyps   . COPD (chronic obstructive pulmonary disease)   . Diverticulitis of colon   . GERD (gastroesophageal reflux disease)   . HTN (hypertension)   . Osteoarthritis   . Hyperlipemia   . Aortic aneurysm, abdominal   . Ovarian cyst, left   . Psoriasis   . Chronic respiratory failure     Uses home oxygen at 2 L  . ADRENAL MASS 05/04/2007  . GLAUCOMA 03/30/2007  . HEMORRHOIDS, INTERNAL 03/23/2008  . OSTEOPOROSIS 03/30/2007  . SCOLIOSIS 03/30/2007  . PONV (postoperative nausea and vomiting)       Review of Systems  neg for any significant sore throat, dysphagia, itching, sneezing, nasal congestion or excess/ purulent secretions, fever, chills, sweats, unintended wt loss, pleuritic or exertional cp, hempoptysis, orthopnea pnd or change in chronic leg swelling. Also denies presyncope, palpitations, heartburn, abdominal pain, nausea, vomiting, diarrhea or change in bowel or urinary habits, dysuria,hematuria, rash, arthralgias, visual complaints, headache, numbness weakness or ataxia.     Objective:   Physical Exam   Gen. Pleasant, well-nourished, in no distress, normal affect ENT - no lesions, no  post nasal drip Neck: No JVD, no thyromegaly, no carotid bruits Lungs: no use of accessory muscles, no dullness to percussion, CTA , diminished BS in bases  Cardiovascular: Rhythm regular, heart sounds  normal, no murmurs or gallops, no peripheral edema Abdomen: soft and non-tender, no hepatosplenomegaly, BS normal. Musculoskeletal: No deformities, no cyanosis or clubbing Neuro:  alert, non focal        Assessment & Plan:

## 2012-05-10 NOTE — Assessment & Plan Note (Addendum)
Recent exacerbation w/ hospitalization  Improving on abx and steroid taper  Will add ICS    Plan  Decrease prednisone 20mg  daily x 1 week then 10mg  daily -hold at this dose.  Begin QVAR 2 puffs Twice daily  -brush/rinse/gargle after use.  Continue on Duoneb Four times a day   follow up Dr. Delford Field  In 4 weeks and As needed   Please contact office for sooner follow up if symptoms do not improve or worsen or seek emergency care

## 2012-05-10 NOTE — Addendum Note (Signed)
Addended by: Boone Master E on: 05/10/2012 04:26 PM   Modules accepted: Orders

## 2012-05-10 NOTE — Patient Instructions (Addendum)
Decrease prednisone 20mg  daily x 1 week then 10mg  daily -hold at this dose.  Begin QVAR 2 puffs Twice daily  -brush/rinse/gargle after use.  Continue on Duoneb Four times a day   follow up Dr. Delford Field  In 4 weeks and As needed   Please contact office for sooner follow up if symptoms do not improve or worsen or seek emergency care

## 2012-05-11 ENCOUNTER — Telehealth: Payer: Self-pay | Admitting: Family Medicine

## 2012-05-11 MED ORDER — BECLOMETHASONE DIPROPIONATE 80 MCG/ACT IN AERS: 2.0000 | INHALATION_SPRAY | Freq: Two times a day (BID) | RESPIRATORY_TRACT | Status: DC

## 2012-05-11 NOTE — Addendum Note (Signed)
Addended by: Boone Master E on: 05/11/2012 11:38 AM   Modules accepted: Orders

## 2012-05-11 NOTE — Telephone Encounter (Signed)
Caller: Kelly/; Phone: [phone]; Reason for Call: Tresa Endo from [facility] is calling to leave a message for Dr Dayton Martes.  Pt is going to miss PT this week.  Pt refused therapy.

## 2012-05-12 NOTE — Telephone Encounter (Signed)
Noted  

## 2012-05-19 ENCOUNTER — Telehealth: Payer: Self-pay | Admitting: *Deleted

## 2012-05-19 NOTE — Telephone Encounter (Signed)
I did tell the nurse when I spoke with her but she said pt was refusing to go and she was not in acute distress.  She said the patient's husband assured her that he would take her to ER if she got worse.

## 2012-05-19 NOTE — Telephone Encounter (Signed)
If she has SOB and her HR is 120, she absolutely needs to go to ER.  Please advise pt and HH nurse.

## 2012-05-19 NOTE — Telephone Encounter (Signed)
Home health nurse is with patient- she is complaining of shortness or breath and increased heart rate.  She just recently got out of the hospital and has a follow up visit scheduled for tomorrow.  She refuses to go to ER. Vitals are good, other than HR of 120 after walking to the bathroom, but nurse says she runs around 96 at rest.   Nurse is asking if you want to order any labs today.  Nurse also said pt is "fed up" with pulmonologist.  Please advise.

## 2012-05-19 NOTE — Telephone Encounter (Signed)
Noted. Thank you.  We can draw any necessary labs tomorrow.

## 2012-05-20 ENCOUNTER — Inpatient Hospital Stay (HOSPITAL_COMMUNITY): Payer: Medicare HMO

## 2012-05-20 ENCOUNTER — Encounter: Payer: Self-pay | Admitting: Family Medicine

## 2012-05-20 ENCOUNTER — Ambulatory Visit (INDEPENDENT_AMBULATORY_CARE_PROVIDER_SITE_OTHER): Payer: Medicare HMO | Admitting: Family Medicine

## 2012-05-20 ENCOUNTER — Inpatient Hospital Stay (HOSPITAL_COMMUNITY)
Admission: EM | Admit: 2012-05-20 | Discharge: 2012-05-25 | DRG: 189 | Disposition: A | Discharge status: Home Health | Payer: Medicare HMO | Attending: Internal Medicine | Admitting: Internal Medicine

## 2012-05-20 ENCOUNTER — Encounter (HOSPITAL_COMMUNITY): Payer: Self-pay | Admitting: Emergency Medicine

## 2012-05-20 ENCOUNTER — Emergency Department (HOSPITAL_COMMUNITY): Payer: Medicare HMO

## 2012-05-20 VITALS — BP 170/88 | HR 150 | Temp 98.1°F | Wt 133.0 lb

## 2012-05-20 DIAGNOSIS — M545 Low back pain, unspecified: Secondary | ICD-10-CM

## 2012-05-20 DIAGNOSIS — Z66 Do not resuscitate: Secondary | ICD-10-CM | POA: Diagnosis present

## 2012-05-20 DIAGNOSIS — R198 Other specified symptoms and signs involving the digestive system and abdomen: Secondary | ICD-10-CM

## 2012-05-20 DIAGNOSIS — E278 Other specified disorders of adrenal gland: Secondary | ICD-10-CM

## 2012-05-20 DIAGNOSIS — B029 Zoster without complications: Secondary | ICD-10-CM

## 2012-05-20 DIAGNOSIS — E785 Hyperlipidemia, unspecified: Secondary | ICD-10-CM

## 2012-05-20 DIAGNOSIS — M25559 Pain in unspecified hip: Secondary | ICD-10-CM

## 2012-05-20 DIAGNOSIS — K648 Other hemorrhoids: Secondary | ICD-10-CM

## 2012-05-20 DIAGNOSIS — F419 Anxiety disorder, unspecified: Secondary | ICD-10-CM

## 2012-05-20 DIAGNOSIS — J4489 Other specified chronic obstructive pulmonary disease: Secondary | ICD-10-CM

## 2012-05-20 DIAGNOSIS — S81819A Laceration without foreign body, unspecified lower leg, initial encounter: Secondary | ICD-10-CM

## 2012-05-20 DIAGNOSIS — M25511 Pain in right shoulder: Secondary | ICD-10-CM

## 2012-05-20 DIAGNOSIS — I714 Abdominal aortic aneurysm, without rupture, unspecified: Secondary | ICD-10-CM

## 2012-05-20 DIAGNOSIS — I10 Essential (primary) hypertension: Secondary | ICD-10-CM

## 2012-05-20 DIAGNOSIS — H6120 Impacted cerumen, unspecified ear: Secondary | ICD-10-CM

## 2012-05-20 DIAGNOSIS — R040 Epistaxis: Secondary | ICD-10-CM

## 2012-05-20 DIAGNOSIS — K2980 Duodenitis without bleeding: Secondary | ICD-10-CM

## 2012-05-20 DIAGNOSIS — Z9981 Dependence on supplemental oxygen: Secondary | ICD-10-CM

## 2012-05-20 DIAGNOSIS — R0600 Dyspnea, unspecified: Secondary | ICD-10-CM

## 2012-05-20 DIAGNOSIS — F411 Generalized anxiety disorder: Secondary | ICD-10-CM | POA: Diagnosis present

## 2012-05-20 DIAGNOSIS — L821 Other seborrheic keratosis: Secondary | ICD-10-CM

## 2012-05-20 DIAGNOSIS — R109 Unspecified abdominal pain: Secondary | ICD-10-CM

## 2012-05-20 DIAGNOSIS — H409 Unspecified glaucoma: Secondary | ICD-10-CM

## 2012-05-20 DIAGNOSIS — J449 Chronic obstructive pulmonary disease, unspecified: Secondary | ICD-10-CM

## 2012-05-20 DIAGNOSIS — J9620 Acute and chronic respiratory failure, unspecified whether with hypoxia or hypercapnia: Principal | ICD-10-CM | POA: Diagnosis present

## 2012-05-20 DIAGNOSIS — K219 Gastro-esophageal reflux disease without esophagitis: Secondary | ICD-10-CM

## 2012-05-20 DIAGNOSIS — R1310 Dysphagia, unspecified: Secondary | ICD-10-CM

## 2012-05-20 DIAGNOSIS — R5383 Other fatigue: Secondary | ICD-10-CM

## 2012-05-20 DIAGNOSIS — R5381 Other malaise: Secondary | ICD-10-CM

## 2012-05-20 DIAGNOSIS — K2990 Gastroduodenitis, unspecified, without bleeding: Secondary | ICD-10-CM

## 2012-05-20 DIAGNOSIS — M1990 Unspecified osteoarthritis, unspecified site: Secondary | ICD-10-CM

## 2012-05-20 DIAGNOSIS — K449 Diaphragmatic hernia without obstruction or gangrene: Secondary | ICD-10-CM

## 2012-05-20 DIAGNOSIS — R1319 Other dysphagia: Secondary | ICD-10-CM

## 2012-05-20 DIAGNOSIS — J441 Chronic obstructive pulmonary disease with (acute) exacerbation: Secondary | ICD-10-CM

## 2012-05-20 DIAGNOSIS — J9612 Chronic respiratory failure with hypercapnia: Secondary | ICD-10-CM

## 2012-05-20 DIAGNOSIS — L408 Other psoriasis: Secondary | ICD-10-CM

## 2012-05-20 DIAGNOSIS — R079 Chest pain, unspecified: Secondary | ICD-10-CM

## 2012-05-20 DIAGNOSIS — M810 Age-related osteoporosis without current pathological fracture: Secondary | ICD-10-CM

## 2012-05-20 DIAGNOSIS — M4120 Other idiopathic scoliosis, site unspecified: Secondary | ICD-10-CM

## 2012-05-20 DIAGNOSIS — J9610 Chronic respiratory failure, unspecified whether with hypoxia or hypercapnia: Secondary | ICD-10-CM

## 2012-05-20 LAB — BASIC METABOLIC PANEL
BUN: 17 mg/dL (ref 6–23)
Calcium: 9.7 mg/dL (ref 8.4–10.5)
Chloride: 104 mEq/L (ref 96–112)
Creatinine, Ser: 0.72 mg/dL (ref 0.50–1.10)
GFR calc Af Amer: >90 mL/min (ref >90)
GFR calc non Af Amer: 80 mL/min — ABNORMAL LOW (ref >90)

## 2012-05-20 LAB — CBC
HCT: 37.1 % (ref 36.0–46.0)
MCHC: 31.3 g/dL (ref 30.0–36.0)
Platelets: 267 10*3/uL (ref 150–400)
RDW: 12.4 % (ref 11.5–15.5)

## 2012-05-20 LAB — POCT I-STAT TROPONIN I: Troponin i, poc: 0 ng/mL (ref 0.00–0.08)

## 2012-05-20 MED ORDER — SODIUM CHLORIDE 0.9 % IV SOLN: 250.0000 mL | INTRAVENOUS | Status: DC | PRN

## 2012-05-20 MED ORDER — METHYLPREDNISOLONE SODIUM SUCC 125 MG IJ SOLR
125.0000 mg | Freq: Once | INTRAMUSCULAR | Status: AC
  Administered 2012-05-20: 125 mg via INTRAVENOUS
  Filled 2012-05-20: qty 2

## 2012-05-20 MED ORDER — IPRATROPIUM BROMIDE 0.02 % IN SOLN
0.5000 mg | Freq: Four times a day (QID) | RESPIRATORY_TRACT | Status: DC
  Administered 2012-05-20 – 2012-05-21 (×4): 0.5 mg via RESPIRATORY_TRACT
  Filled 2012-05-20 (×5): qty 2.5

## 2012-05-20 MED ORDER — ATORVASTATIN CALCIUM 10 MG PO TABS
10.0000 mg | ORAL_TABLET | Freq: Every day | ORAL | Status: DC
  Administered 2012-05-21 – 2012-05-24 (×4): 10 mg via ORAL
  Filled 2012-05-20 (×6): qty 1

## 2012-05-20 MED ORDER — ALBUTEROL SULFATE (5 MG/ML) 0.5% IN NEBU
2.5000 mg | INHALATION_SOLUTION | Freq: Four times a day (QID) | RESPIRATORY_TRACT | Status: DC
  Administered 2012-05-20 – 2012-05-21 (×4): 2.5 mg via RESPIRATORY_TRACT
  Filled 2012-05-20 (×4): qty 0.5

## 2012-05-20 MED ORDER — POTASSIUM CHLORIDE CRYS ER 20 MEQ PO TBCR
20.0000 meq | EXTENDED_RELEASE_TABLET | Freq: Every day | ORAL | Status: DC
  Administered 2012-05-21 – 2012-05-25 (×5): 20 meq via ORAL
  Filled 2012-05-20 (×5): qty 1

## 2012-05-20 MED ORDER — ACETAMINOPHEN 325 MG PO TABS
650.0000 mg | ORAL_TABLET | Freq: Once | ORAL | Status: AC
  Administered 2012-05-20: 650 mg via ORAL
  Filled 2012-05-20: qty 2

## 2012-05-20 MED ORDER — LEVOFLOXACIN 500 MG PO TABS
500.0000 mg | ORAL_TABLET | Freq: Every day | ORAL | Status: AC
  Administered 2012-05-21 – 2012-05-24 (×4): 500 mg via ORAL
  Filled 2012-05-20 (×4): qty 1

## 2012-05-20 MED ORDER — TECHNETIUM TC 99M DIETHYLENETRIAME-PENTAACETIC ACID: 45.0000 | Freq: Once | INTRAVENOUS | Status: AC | PRN

## 2012-05-20 MED ORDER — LOSARTAN POTASSIUM 50 MG PO TABS
100.0000 mg | ORAL_TABLET | Freq: Every day | ORAL | Status: DC
  Administered 2012-05-21 – 2012-05-25 (×5): 100 mg via ORAL
  Filled 2012-05-20 (×5): qty 2

## 2012-05-20 MED ORDER — ONDANSETRON HCL 4 MG PO TABS: 4.0000 mg | ORAL_TABLET | Freq: Four times a day (QID) | ORAL | Status: DC | PRN

## 2012-05-20 MED ORDER — METHYLPREDNISOLONE SODIUM SUCC 40 MG IJ SOLR
40.0000 mg | Freq: Four times a day (QID) | INTRAMUSCULAR | Status: DC
  Administered 2012-05-20 – 2012-05-23 (×11): 40 mg via INTRAVENOUS
  Filled 2012-05-20 (×15): qty 1

## 2012-05-20 MED ORDER — HEPARIN SODIUM (PORCINE) 5000 UNIT/ML IJ SOLN
5000.0000 [IU] | Freq: Three times a day (TID) | INTRAMUSCULAR | Status: DC
  Administered 2012-05-20 – 2012-05-25 (×14): 5000 [IU] via SUBCUTANEOUS
  Filled 2012-05-20 (×18): qty 1

## 2012-05-20 MED ORDER — RANITIDINE HCL 150 MG/10ML PO SYRP
150.0000 mg | ORAL_SOLUTION | Freq: Two times a day (BID) | ORAL | Status: DC | PRN
  Administered 2012-05-21: 150 mg via ORAL
  Filled 2012-05-20: qty 10

## 2012-05-20 MED ORDER — DOCUSATE SODIUM 100 MG PO CAPS
100.0000 mg | ORAL_CAPSULE | Freq: Two times a day (BID) | ORAL | Status: DC
  Administered 2012-05-20 – 2012-05-25 (×10): 100 mg via ORAL
  Filled 2012-05-20 (×11): qty 1

## 2012-05-20 MED ORDER — ALBUTEROL SULFATE (5 MG/ML) 0.5% IN NEBU: 2.5000 mg | INHALATION_SOLUTION | RESPIRATORY_TRACT | Status: DC | PRN

## 2012-05-20 MED ORDER — SODIUM CHLORIDE 0.9 % IJ SOLN
3.0000 mL | Freq: Two times a day (BID) | INTRAMUSCULAR | Status: DC
  Administered 2012-05-20 – 2012-05-25 (×10): 3 mL via INTRAVENOUS

## 2012-05-20 MED ORDER — PANTOPRAZOLE SODIUM 40 MG PO TBEC
40.0000 mg | DELAYED_RELEASE_TABLET | Freq: Every day | ORAL | Status: DC
  Administered 2012-05-21 – 2012-05-22 (×2): 40 mg via ORAL
  Filled 2012-05-20 (×2): qty 1

## 2012-05-20 MED ORDER — DSS 100 MG PO CAPS: 100.0000 mg | ORAL_CAPSULE | Freq: Two times a day (BID) | ORAL | Status: DC

## 2012-05-20 MED ORDER — ONDANSETRON HCL 4 MG/2ML IJ SOLN: 4.0000 mg | Freq: Four times a day (QID) | INTRAMUSCULAR | Status: DC | PRN

## 2012-05-20 MED ORDER — ALUM & MAG HYDROXIDE-SIMETH 200-200-20 MG/5ML PO SUSP
15.0000 mL | Freq: Four times a day (QID) | ORAL | Status: DC | PRN
  Administered 2012-05-20: 15 mL via ORAL
  Filled 2012-05-20: qty 30

## 2012-05-20 MED ORDER — FUROSEMIDE 20 MG PO TABS
20.0000 mg | ORAL_TABLET | Freq: Two times a day (BID) | ORAL | Status: DC
  Administered 2012-05-21 – 2012-05-25 (×10): 20 mg via ORAL
  Filled 2012-05-20 (×12): qty 1

## 2012-05-20 MED ORDER — HYDROCOD POLST-CHLORPHEN POLST 10-8 MG/5ML PO LQCR: 5.0000 mL | Freq: Two times a day (BID) | ORAL | Status: DC | PRN

## 2012-05-20 MED ORDER — IPRATROPIUM BROMIDE 0.02 % IN SOLN
0.5000 mg | RESPIRATORY_TRACT | Status: DC | PRN
  Filled 2012-05-20 (×4): qty 2.5

## 2012-05-20 MED ORDER — LEVALBUTEROL HCL 1.25 MG/0.5ML IN NEBU
1.2500 mg | INHALATION_SOLUTION | Freq: Once | RESPIRATORY_TRACT | Status: AC
  Administered 2012-05-20: 1.25 mg via RESPIRATORY_TRACT
  Filled 2012-05-20: qty 0.5

## 2012-05-20 MED ORDER — ACETAMINOPHEN 650 MG RE SUPP: 650.0000 mg | Freq: Four times a day (QID) | RECTAL | Status: DC | PRN

## 2012-05-20 MED ORDER — MORPHINE SULFATE 2 MG/ML IJ SOLN: 2.0000 mg | INTRAMUSCULAR | Status: DC | PRN

## 2012-05-20 MED ORDER — TECHNETIUM TO 99M ALBUMIN AGGREGATED
6.0000 | Freq: Once | INTRAVENOUS | Status: AC | PRN
  Administered 2012-05-20: 6 via INTRAVENOUS

## 2012-05-20 MED ORDER — LEVALBUTEROL HCL 1.25 MG/3ML IN NEBU: 1.2500 mg | INHALATION_SOLUTION | Freq: Once | RESPIRATORY_TRACT | Status: DC

## 2012-05-20 MED ORDER — SODIUM CHLORIDE 0.9 % IJ SOLN
3.0000 mL | Freq: Two times a day (BID) | INTRAMUSCULAR | Status: DC
  Administered 2012-05-22 – 2012-05-23 (×3): 3 mL via INTRAVENOUS

## 2012-05-20 MED ORDER — ACETAMINOPHEN 325 MG PO TABS: 650.0000 mg | ORAL_TABLET | Freq: Four times a day (QID) | ORAL | Status: DC | PRN

## 2012-05-20 MED ORDER — ALBUTEROL SULFATE (5 MG/ML) 0.5% IN NEBU
5.0000 mg | INHALATION_SOLUTION | Freq: Once | RESPIRATORY_TRACT | Status: DC
  Filled 2012-05-20: qty 1

## 2012-05-20 MED ORDER — ALPRAZOLAM 0.5 MG PO TABS
0.5000 mg | ORAL_TABLET | Freq: Three times a day (TID) | ORAL | Status: DC | PRN
  Administered 2012-05-20 – 2012-05-22 (×4): 0.5 mg via ORAL
  Filled 2012-05-20 (×4): qty 1

## 2012-05-20 MED ORDER — HYDROCODONE-ACETAMINOPHEN 5-325 MG PO TABS
1.0000 | ORAL_TABLET | Freq: Four times a day (QID) | ORAL | Status: DC | PRN
  Administered 2012-05-20 – 2012-05-25 (×6): 1 via ORAL
  Filled 2012-05-20 (×7): qty 1

## 2012-05-20 MED ORDER — ALBUTEROL SULFATE (5 MG/ML) 0.5% IN NEBU
5.0000 mg | INHALATION_SOLUTION | Freq: Once | RESPIRATORY_TRACT | Status: AC
  Administered 2012-05-20: 5 mg via RESPIRATORY_TRACT
  Filled 2012-05-20: qty 1

## 2012-05-20 MED ORDER — SODIUM CHLORIDE 0.9 % IJ SOLN: 3.0000 mL | INTRAMUSCULAR | Status: DC | PRN

## 2012-05-20 NOTE — ED Provider Notes (Signed)
History     CSN: [id number]  Arrival date & time 05/20/12  1146   First MD Initiated Contact with Patient 05/20/12 1153      Chief Complaint  Patient presents with  . Shortness of Breath    (Consider location/radiation/quality/duration/timing/severity/associated sxs/prior treatment) Patient is a 78 y.o. female presenting with shortness of breath. The history is provided by the patient.  Shortness of Breath  Associated symptoms include shortness of breath.   patient here with shortness of breath similar to her COPD exacerbations. Was admitted to the hospital please ago for same. Has had increasing cough with no fever. Patient does use home oxygen. Went to her doctor's office today and was to come here for evaluation. Just finished a prednisone taper 2 days ago. No vomiting or diarrhea. No urinary symptoms. Some transient relief with her general. Denies any anginal type chest pain  Past Medical History  Diagnosis Date  . History of colonic polyps   . COPD (chronic obstructive pulmonary disease)   . Diverticulitis of colon   . GERD (gastroesophageal reflux disease)   . HTN (hypertension)   . Osteoarthritis   . Hyperlipemia   . Aortic aneurysm, abdominal   . Ovarian cyst, left   . Psoriasis   . Chronic respiratory failure     Uses home oxygen at 2 L  . ADRENAL MASS 05/04/2007  . GLAUCOMA 03/30/2007  . HEMORRHOIDS, INTERNAL 03/23/2008  . OSTEOPOROSIS 03/30/2007  . SCOLIOSIS 03/30/2007  . PONV (postoperative nausea and vomiting)     Past Surgical History  Procedure Date  . Kidney stone surgery   . Appendectomy   . Tonsillectomy   . Hemiarthroplasty shoulder fracture   . Cataract extraction, bilateral   . Abdominal aortic aneurysm repair   . Abdominal hysterectomy     Family History  Problem Relation Age of Onset  . Heart attack Mother   . Heart attack Father   . Deep vein thrombosis Father   . Cancer Brother     Pancreas and colon    History  Substance Use  Topics  . Smoking status: Former Smoker -- 0.5 packs/day for 40 years    Types: Cigarettes    Quit date: 06/16/2009  . Smokeless tobacco: Never Used  . Alcohol Use: No    OB History    Grav Para Term Preterm Abortions TAB SAB Ect Mult Living                  Review of Systems  Respiratory: Positive for shortness of breath.   All other systems reviewed and are negative.    Allergies  Clarithromycin; Hydrocodone-guaifenesin; Iohexol; Moxifloxacin; Prednisone; and Sulfonamide derivatives  Home Medications   Current Outpatient Rx  Name  Route  Sig  Dispense  Refill  . ALBUTEROL SULFATE (5 MG/ML) 0.5% IN NEBU   Nebulization   Take 0.5 mLs (2.5 mg total) by nebulization every 2 (two) hours as needed for wheezing or shortness of breath.   20 mL   0   . ALPRAZOLAM 0.5 MG PO TABS   Oral   Take 1 tablet (0.5 mg total) by mouth 3 (three) times daily as needed for anxiety or sleep.   60 tablet   0   . BECLOMETHASONE DIPROPIONATE 80 MCG/ACT IN AERS   Inhalation   Inhale 2 puffs into the lungs 2 (two) times daily.   1 Inhaler   5   . BECLOMETHASONE DIPROPIONATE 80 MCG/ACT IN AERS   Inhalation  Inhale 2 puffs into the lungs 2 (two) times daily.   1 Inhaler   0   . HYDROCOD POLST-CPM POLST ER 10-8 MG/5ML PO LQCR   Oral   Take 5 mLs by mouth every 12 (twelve) hours as needed.   140 mL   0   . DSS 100 MG PO CAPS   Oral   Take 100 mg by mouth 2 (two) times daily.   10 capsule      . ESOMEPRAZOLE MAGNESIUM 40 MG PO CPDR   Oral   Take 40 mg by mouth daily before breakfast.         . FUROSEMIDE 20 MG PO TABS      TAKE 1 TABLET (20 MG TOTAL) BY MOUTH DAILY.   10 tablet   0   . GARLIC 400 MG PO TBEC   Oral   Take 1 tablet by mouth daily.           [name] HYDROCODONE-ACETAMINOPHEN 5-500 MG PO TABS   Oral   Take 1 tablet by mouth every 6 (six) hours as needed. pain         . IPRATROPIUM-ALBUTEROL 0.5-2.5 (3) MG/3ML IN SOLN   Nebulization   Take 3 mLs by  nebulization 4 (four) times daily.         [name] LOSARTAN POTASSIUM 100 MG PO TABS   Oral   Take 100 mg by mouth daily.         [name] POTASSIUM CHLORIDE ER 10 MEQ PO CPCR   Oral   Take 10 mEq by mouth daily as needed.         [name] PREDNISONE 10 MG PO TABS      Take 6 tabs for 4 days (11/16-11/19) Take 4 tabs for 4 days (11/20-11/23) take 2 tabs for 4 days (11/24-11/27) then take 1 tab daily.   90 tablet   0   . ROSUVASTATIN CALCIUM 10 MG PO TABS   Oral   Take 10 mg by mouth daily.         . AEROCHAMBER MV MISC      Use as instructed   1 each   0     BP 143/96  Pulse 115  Temp 97.8 F (36.6 C) (Oral)  Resp 24  SpO2 100%  Physical Exam  Nursing note and vitals reviewed. Constitutional: She is oriented to person, place, and time. She appears well-developed and well-nourished.  Non-toxic appearance. No distress.  HENT:  Head: Normocephalic and atraumatic.  Eyes: Conjunctivae normal, EOM and lids are normal. Pupils are equal, round, and reactive to light.  Neck: Normal range of motion. Neck supple. No tracheal deviation present. No mass present.  Cardiovascular: Regular rhythm and normal heart sounds.  Tachycardia present.  Exam reveals no gallop.   No murmur heard. Pulmonary/Chest: Effort normal. No stridor. No respiratory distress. She has decreased breath sounds. She has wheezes. She has no rhonchi. She has no rales.  Abdominal: Soft. Normal appearance and bowel sounds are normal. She exhibits no distension. There is no tenderness. There is no rebound and no CVA tenderness.  Musculoskeletal: Normal range of motion. She exhibits no edema and no tenderness.  Neurological: She is alert and oriented to person, place, and time. She has normal strength. No cranial nerve deficit or sensory deficit. GCS eye subscore is 4. GCS verbal subscore is 5. GCS motor subscore is 6.  Skin: Skin is warm and dry. No abrasion and no rash noted.  Psychiatric: She  has a normal mood and affect.  Her speech is normal and behavior is normal.    ED Course  Procedures (including critical care time)   Labs Reviewed  BASIC METABOLIC PANEL  CBC  D-DIMER, QUANTITATIVE   No results found.   No diagnosis found.    MDM  Patient given albuterol treatment here x2 along with Xopenex. Also given salmeterol. On exam remains with wheezes. Reassessed multiple times and continues to be dyspneic. Will be admitted by triad hospitalist  CRITICAL CARE Performed by: Toy Baker   Total critical care time: 60  Critical care time was exclusive of separately billable procedures and treating other patients.  Critical care was necessary to treat or prevent imminent or life-threatening deterioration.  Critical care was time spent personally by me on the following activities: development of treatment plan with patient and/or surrogate as well as nursing, discussions with consultants, evaluation of patient's response to treatment, examination of patient, obtaining history from patient or surrogate, ordering and performing treatments and interventions, ordering and review of laboratory studies, ordering and review of radiographic studies, pulse oximetry and re-evaluation of patient's condition.         Toy Baker, MD 05/20/12 1430

## 2012-05-20 NOTE — H&P (Signed)
PCP:   [name], MD   Chief Complaint:  SOB/Wheeze.   HPI: This is a 78 year old female, with know history of COPD/Chronic respiratory failure, on home oxygen at 3L/Min, HTN, GERD, diverticulosis, OA, osteoporosis, scoliosis, dyslipidemia, AAA s/p repair, Psoriasis, adrenal mass, Glaucoma, s/p bilateral cataract surgery, s/p TAH, urolithiasis, s/p surgery, s/p appendectomy, s/p tonsillectomy, presenting with progressive SOB.wheeze. Patient is s/p hospitalization 04/27/12-04/30/12 for similar symptoms, and completed her steroid taper about 2 days ago. According to patient, since discharge, she was never quite back to baseline respiratory status. She has become progressively SOB and wheezy, worse in the last one week, with a dry cough, but no chest pain. Her effort tolerance has markedly decreased, so that she has been unable to participate in home physiotherapy. She was scheduled for a follow up appointment with her PMD on 05/02/12, but missed this appointment, so an RN from her PMD's office showed up at her home on 05/19/12, and arranged an appointment for today. Omn arrival at her PMD's office this AM she was evaluated and referred to the ED. This is her 3rd ED visit this year, for COPD exacerbation.   Allergies:   Allergies  Allergen Reactions  . Clarithromycin     REACTION: abd pain  . Hydrocodone-Guaifenesin     REACTION: feels funny  . Iohexol      Desc: pt reports dyspnea and throat swelling about age 69 w/ IV contrast reaction when kidneys were being checked; 07/25/08 [facility]   . Moxifloxacin     REACTION: hallucinations  . Prednisone   . Sulfonamide Derivatives     REACTION: unknown      Past Medical History  Diagnosis Date  . History of colonic polyps   . COPD (chronic obstructive pulmonary disease)   . Diverticulitis of colon   . GERD (gastroesophageal reflux disease)   . HTN (hypertension)   . Osteoarthritis   . Hyperlipemia   . Aortic aneurysm, abdominal   . Ovarian  cyst, left   . Psoriasis   . Chronic respiratory failure     Uses home oxygen at 2 L  . ADRENAL MASS 05/04/2007  . GLAUCOMA 03/30/2007  . HEMORRHOIDS, INTERNAL 03/23/2008  . OSTEOPOROSIS 03/30/2007  . SCOLIOSIS 03/30/2007  . PONV (postoperative nausea and vomiting)     Past Surgical History  Procedure Date  . Kidney stone surgery   . Appendectomy   . Tonsillectomy   . Hemiarthroplasty shoulder fracture   . Cataract extraction, bilateral   . Abdominal aortic aneurysm repair   . Abdominal hysterectomy     Prior to Admission medications   Medication Sig Start Date End Date Taking? Authorizing Provider  albuterol (PROVENTIL) (5 MG/ML) 0.5% nebulizer solution Take 0.5 mLs (2.5 mg total) by nebulization every 2 (two) hours as needed for wheezing or shortness of breath. 04/30/12  Yes Lesle Sanjit Mcmichael Black, NP  ALPRAZolam Prudy Feeler) 0.5 MG tablet Take 1 tablet (0.5 mg total) by mouth 3 (three) times daily as needed for anxiety or sleep. 04/30/12  Yes Lesle Fidel Caggiano Black, NP  beclomethasone (QVAR) 80 MCG/ACT inhaler Inhale 2 puffs into the lungs 2 (two) times daily. 05/10/12  Yes Tammy S Parrett, NP  beclomethasone (QVAR) 80 MCG/ACT inhaler Inhale 2 puffs into the lungs 2 (two) times daily. 05/11/12  Yes Tammy S Parrett, NP  chlorpheniramine-HYDROcodone (TUSSIONEX) 10-8 MG/5ML LQCR Take 5 mLs by mouth every 12 (twelve) hours as needed. 04/30/12  Yes Gwenyth Bender, NP  Docusate Sodium (DSS) 100  MG CAPS Take 100 mg by mouth 2 (two) times daily. 04/30/12  Yes Lesle Voyd Groft Black, NP  esomeprazole (NEXIUM) 40 MG capsule Take 40 mg by mouth 2 (two) times daily.    Yes Historical Provider, MD  furosemide (LASIX) 20 MG tablet Take 20 mg by mouth 2 (two) times daily.   Yes Historical Provider, MD  furosemide (LASIX) 20 MG tablet  05/07/12 05/20/12 Yes Storm Frisk, MD  Garlic (GARLIQUE) 400 MG TBEC Take 1 tablet by mouth daily.    Yes Historical Provider, MD  HYDROcodone-acetaminophen (VICODIN) 5-500 MG per tablet Take  1 tablet by mouth every 6 (six) hours as needed. pain   Yes Historical Provider, MD  ipratropium-albuterol (DUONEB) 0.5-2.5 (3) MG/3ML SOLN Take 3 mLs by nebulization 4 (four) times daily.   Yes Historical Provider, MD  losartan (COZAAR) 100 MG tablet Take 100 mg by mouth daily.   Yes Historical Provider, MD  potassium chloride (MICRO-K) 10 MEQ CR capsule Take 10 mEq by mouth daily as needed. 07/30/11 07/29/12 Yes Storm Frisk, MD  predniSONE (DELTASONE) 10 MG tablet Take 6 tabs for 4 days (11/16-11/19) Take 4 tabs for 4 days (11/20-11/23) take 2 tabs for 4 days (11/24-11/27) then take 1 tab daily. 04/30/12  Yes Lesle Troi Florendo Black, NP  rosuvastatin (CRESTOR) 10 MG tablet Take 10 mg by mouth daily.   Yes Historical Provider, MD  Spacer/Aero-Holding Chambers (AEROCHAMBER MV) inhaler Use as instructed 05/10/12  Yes Tammy Rogers Seeds, NP    Social History: Patient reports that she quit smoking about 2 years ago. Her smoking use included Cigarettes. She has a 20 pack-year smoking history. She has never used smokeless tobacco. She reports that she does not drink alcohol or use illicit drugs.  Family History  Problem Relation Age of Onset  . Heart attack Mother   . Heart attack Father   . Deep vein thrombosis Father   . Cancer Brother     Pancreas and colon    Review of Systems:  As per HPI and chief complaint. Patent denies fatigue, diminished appetite, weight loss, fever, chills, headache, blurred vision, difficulty in speaking, dysphagia, chest pain, orthopnea, paroxysmal nocturnal dyspnea, nausea, diaphoresis, abdominal pain, vomiting, diarrhea, belching, heartburn, hematemesis, melena, dysuria, nocturia, urinary frequency, hematochezia, lower extremity swelling, pain, or redness. The rest of the systems review is negative.  Physical Exam:  General:  Patient does not appear to be in obvious acute respiratory distress. Alert, communicative, fully oriented, talking in complete sentences, not short of  breath at rest, although she is somewhat short of breath on minimal exertion.  HEENT:  Mild clinical pallor, no jaundice, no conjunctival injection or discharge. NECK:  Supple, JVP not seen, no carotid bruits, no palpable lymphadenopathy, no palpable goiter. CHEST:  Bilateral expiratory wheezes, no crackles. HEART:  Sounds 1 and 2 heard, normal, regular, no murmurs. ABDOMEN:  Moderately obese, soft, non-tender, no palpable organomegaly, no palpable masses, normal bowel sounds. GENITALIA:  Not examined. LOWER EXTREMITIES:  No pitting edema, palpable peripheral pulses. MUSCULOSKELETAL SYSTEM:  Generalized osteoarthritic changes, otherwise, normal. CENTRAL NERVOUS SYSTEM:  No focal neurologic deficit on gross examination.  Labs on Admission:  Results for orders placed during the hospital encounter of 05/20/12 (from the past 48 hour(s))  BASIC METABOLIC PANEL     Status: Abnormal   Collection Time   05/20/12 12:04 PM      Component Value Range Comment   Sodium 143  135 - 145 mEq/L    Potassium  4.0  3.5 - 5.1 mEq/L    Chloride 104  96 - 112 mEq/L    CO2 30  19 - 32 mEq/L    Glucose, Bld 106 (*) 70 - 99 mg/dL    BUN 17  6 - 23 mg/dL    Creatinine, Ser 1.61  0.50 - 1.10 mg/dL    Calcium 9.7  8.4 - 09.6 mg/dL    GFR calc non Af Amer 80 (*) >90 mL/min    GFR calc Af Amer >90  >90 mL/min   CBC     Status: Abnormal   Collection Time   05/20/12 12:04 PM      Component Value Range Comment   WBC 5.2  4.0 - 10.5 K/uL    RBC 4.04  3.87 - 5.11 MIL/uL    Hemoglobin 11.6 (*) 12.0 - 15.0 g/dL    HCT 04.5  40.9 - 81.1 %    MCV 91.8  78.0 - 100.0 fL    MCH 28.7  26.0 - 34.0 pg    MCHC 31.3  30.0 - 36.0 g/dL    RDW 91.4  78.2 - 95.6 %    Platelets 267  150 - 400 K/uL   POCT I-STAT TROPONIN I     Status: Normal   Collection Time   05/20/12 12:17 PM      Component Value Range Comment   Troponin i, poc 0.00  0.00 - 0.08 ng/mL    Comment 3            D-DIMER, QUANTITATIVE     Status: Abnormal    Collection Time   05/20/12 12:20 PM      Component Value Range Comment   D-Dimer, Quant 1.01 (*) 0.00 - 0.48 ug/mL-FEU     Radiological Exams on Admission: *RADIOLOGY REPORT*  Clinical Data: Short of breath. COPD. Emphysema.  CHEST - 2 VIEW  Comparison: 04/27/2012.  Findings: Emphysema is present. The cardiopericardial silhouette  appears within normal limits. Bilateral pleural apical scarring.  No airspace disease. No effusion. Hyperinflation. Stable  appearance of the costophrenic angles and lung bases posteriorly on  the lateral view. Partially visualized abdominal aortic stent  graft.  IMPRESSION:  Emphysema without active cardiopulmonary disease.  Original Report Authenticated By: Andreas Newport, M.D.   Assessment/Plan Active Problems:   1. COPD exacerbation: Patient presents with progressive wheeze, dry cough and dyspnea, over a one-week period. CXR is devoid of acute findings. Flu-test is negative. This is consistent with acute exacerbation of COPD. Will manage with parenteral steroids, antibiotics, nebulizers and oxygen supplementation. This is patient's 3rd hospitalization for COPD this year, so she qualifies for the COPD-Gold program, This will be activated, in an attempt to reduce re-admissions.  2. Dyspnea/Chronic respiratory failure with hypercapnia: Patient appears to have acute on chronic type-2 respiratory failure, due to #1 above. O2 saturation was 79% at presentation, but has improved to 100%, on 4L, settling at 98% on 3L after nebulizer treatment. D-Dimer was elevated at 1.01. CTA is not feasible, due to history of iv dye allergy, so V/Q scan will be arranged to exclude PE.  3. Dyslipidemia: On statin, which will be continued.  4. GERD: Asymptomatic at this time, on PPI.  5. HTN: Controlled on pre-admission anti-hypertensives, which will be continued.   Further management will depend on clinical course.  Comment: Patient is DNR/DNI.  Note: Main contact is son,  [name], Jani Files; (H) [phone] or (C) [phone].  Time Spent on Admission: 1 hour  Anetta Olvera,CHRISTOPHER  05/20/2012, 3:18 PM

## 2012-05-20 NOTE — ED Notes (Signed)
ZOX:WRUE<AV> Expected date:05/20/12<BR> Expected time:<BR> Means of arrival:<BR> Comments:<BR> Hold for Ms Delawder

## 2012-05-20 NOTE — ED Notes (Signed)
Pt to X Ray.

## 2012-05-20 NOTE — ED Notes (Signed)
Pt returned from Xray, Respiratory at bedside.

## 2012-05-20 NOTE — Progress Notes (Signed)
  Subjective:    Patient ID: [name], female    DOB: 03/15/1931, 78 y.o.   MRN: [id number]  HPI  78 yo female with h/o COPD IV, O2 dependent here for hospital follow up.   Notes reviewed.  Admitted to [facility] 11/12- 04/30/2012 for acute COPD exacerbation. Treated with IV abx, steroids and nebs.   She completed an additional course of po doxycyline as outpt and tapered down her prednisone.   Also sent home with home health PT/OT/RN.    Saw pulmonary NP, Tammy on 11/25.  Note reviewed.  Decreased prednisone to 10mg  daily, started QVAR and advised to continue duonebs.    Presents today in significant distress with her sister.  On 4 L O2 per [place] and is working to breath.    Past Medical History  Diagnosis Date  . History of colonic polyps   . COPD (chronic obstructive pulmonary disease)   . Diverticulitis of colon   . GERD (gastroesophageal reflux disease)   . HTN (hypertension)   . Osteoarthritis   . Hyperlipemia   . Aortic aneurysm, abdominal   . Ovarian cyst, left   . Psoriasis   . Chronic respiratory failure     Uses home oxygen at 2 L  . ADRENAL MASS 05/04/2007  . GLAUCOMA 03/30/2007  . HEMORRHOIDS, INTERNAL 03/23/2008  . OSTEOPOROSIS 03/30/2007  . SCOLIOSIS 03/30/2007  . PONV (postoperative nausea and vomiting)       Review of Systems Neg for fever Pos fatigue     Objective:   Physical Exam BP 170/88  Pulse 150  Temp 98.1 F (36.7 C)  Wt 133 lb (60.328 kg)  SpO2 79%   Gen. Pleasant, well-nourished, in no distress, normal affect ENT - no lesions, no post nasal drip Neck: No JVD, no thyromegaly, no carotid bruits Lungs: +accessory muscle use, unable to take deep breaths Cardiovascular: Rhythm regular, heart sounds  normal, no murmurs or gallops, no peripheral edema Abdomen: soft and non-tender, no hepatosplenomegaly, BS normal. Musculoskeletal: No deformities, no cyanosis or clubbing Neuro:  alert, non focal Psych:  tearful    Assessment & Plan:    1. COPD    Deteriorated.  Discussed with pt and her sister, needs to go straight to ER.  She declined EMS transfer.  Her [facility] with take her [facility]. Called [facility] triage nurse and they will be expecting her.

## 2012-05-20 NOTE — ED Notes (Signed)
Spoke to radiology regarding VQ Scan for pt. On Call VQ paged with information on patient by radiology.

## 2012-05-20 NOTE — ED Notes (Signed)
MD at bedside.Admiting MD at bedside to eval.

## 2012-05-20 NOTE — ED Notes (Signed)
Report given to [facility]. Rn is aware of need for VQ scan. Pt to floor.

## 2012-05-20 NOTE — ED Notes (Signed)
Pt presenting to ed with c/o shortness of breath. Pt states she was discharged x 2 weeks ago for the same thing. Pt states she hasn't been any better since discharge. Pt denies chest pain, nausea and vomiting. Pt states "I feel like I'm smothering due to my shortness of breath"

## 2012-05-21 DIAGNOSIS — K219 Gastro-esophageal reflux disease without esophagitis: Secondary | ICD-10-CM

## 2012-05-21 DIAGNOSIS — J449 Chronic obstructive pulmonary disease, unspecified: Secondary | ICD-10-CM

## 2012-05-21 DIAGNOSIS — K2980 Duodenitis without bleeding: Secondary | ICD-10-CM

## 2012-05-21 DIAGNOSIS — R0989 Other specified symptoms and signs involving the circulatory and respiratory systems: Secondary | ICD-10-CM

## 2012-05-21 LAB — CBC
HCT: 32.9 % — ABNORMAL LOW (ref 36.0–46.0)
MCV: 90.4 fL (ref 78.0–100.0)
Platelets: 242 10*3/uL (ref 150–400)
RBC: 3.64 MIL/uL — ABNORMAL LOW (ref 3.87–5.11)
WBC: 3.2 10*3/uL — ABNORMAL LOW (ref 4.0–10.5)

## 2012-05-21 LAB — BASIC METABOLIC PANEL
CO2: 28 mEq/L (ref 19–32)
Chloride: 102 mEq/L (ref 96–112)
GFR calc Af Amer: >90 mL/min (ref >90)
Sodium: 140 mEq/L (ref 135–145)

## 2012-05-21 MED ORDER — LEVALBUTEROL HCL 0.63 MG/3ML IN NEBU
0.6300 mg | INHALATION_SOLUTION | Freq: Four times a day (QID) | RESPIRATORY_TRACT | Status: DC
  Administered 2012-05-21 – 2012-05-23 (×9): 0.63 mg via RESPIRATORY_TRACT
  Filled 2012-05-21 (×15): qty 3

## 2012-05-21 MED ORDER — IPRATROPIUM BROMIDE 0.02 % IN SOLN
0.5000 mg | Freq: Four times a day (QID) | RESPIRATORY_TRACT | Status: DC
  Administered 2012-05-21 – 2012-05-23 (×9): 0.5 mg via RESPIRATORY_TRACT
  Filled 2012-05-21 (×5): qty 2.5

## 2012-05-21 MED ORDER — BUDESONIDE 0.25 MG/2ML IN SUSP
0.2500 mg | Freq: Two times a day (BID) | RESPIRATORY_TRACT | Status: DC
  Administered 2012-05-21 – 2012-05-25 (×8): 0.25 mg via RESPIRATORY_TRACT
  Filled 2012-05-21 (×11): qty 2

## 2012-05-21 NOTE — Evaluation (Signed)
Physical Therapy Evaluation Patient Details Name: [name] MRN: [id number] DOB: October 04, 1930 Today's Date: 05/21/2012 Time: 1030-1055 PT Time Calculation (min): 25 min  PT Assessment / Plan / Recommendation Clinical Impression  78 y.o. female admitted with COPD exacerbation. Pt reports she was unable to tolerate much activity at home. She ambulated 20' today with RW and 4-6L O2, SaO2 dropped to 71%.  93% on 3L O2.  Pt reports her husband is undergoing cancer tx and her 2 sisters who had been caregivers are both having health issues. May need ST-SNF.  No DME needed.     PT Assessment  Patient needs continued PT services    Follow Up Recommendations  SNF    Does the patient have the potential to tolerate intense rehabilitation      Barriers to Discharge   spouse is undergoing cancer treatments, 2 sisters also have health problems, may need ST-SNF, pt expressed anxiety about being weak and not being able to do more for herself    Equipment Recommendations  None recommended by PT    Recommendations for Other Services OT consult   Frequency Min 3X/week    Precautions / Restrictions Precautions Precaution Comments: monitor O2 sats Restrictions Weight Bearing Restrictions: No   Pertinent Vitals/Pain *SaO2 71% on 4-6L  O2 while walking SaO2 93% on 3L at rest RN notified**      Mobility  Bed Mobility Bed Mobility: Supine to Sit Supine to Sit: 6: Modified independent (Device/Increase time);HOB elevated Transfers Transfers: Sit to Stand;Stand to Sit Sit to Stand: From bed;With upper extremity assist;4: Min guard Stand to Sit: To chair/3-in-1;With upper extremity assist;With armrests;4: Min guard Details for Transfer Assistance: VCs hand placement Ambulation/Gait Ambulation/Gait Assistance: 4: Min guard Ambulation Distance (Feet): 20 Feet Assistive device: Rolling walker Gait Pattern: Decreased step length - right;Decreased step length - left;Within Functional  Limits General Gait Details: SaO2 dropped to 71% on 4L and then 6L O2, 93% on 3L at rest    Shoulder Instructions     Exercises     PT Diagnosis: Difficulty walking  PT Problem List: Decreased activity tolerance;Cardiopulmonary status limiting activity PT Treatment Interventions: Gait training;Stair training;Therapeutic exercise;Functional mobility training   PT Goals Acute Rehab PT Goals PT Goal Formulation: With patient Time For Goal Achievement: 05/13/12 Potential to Achieve Goals: Fair Pt will Ambulate: with rolling walker;16 - 50 feet;with supervision (SaO2 greater than 90% on O2) PT Goal: Ambulate - Progress: Goal set today Pt will Go Up / Down Stairs: 3-5 stairs;with min assist;with rail(s) PT Goal: Up/Down Stairs - Progress: Goal set today Pt will Perform Home Exercise Program: Independently PT Goal: Perform Home Exercise Program - Progress: Goal set today  Visit Information  Last PT Received On: 05/21/12 Assistance Needed: +1 PT/OT Co-Evaluation/Treatment: Yes    Subjective Data  Subjective: "I want to walk more later today. I need to get my strength back." Patient Stated Goal: to walk farther   Prior Functioning  Home Living Lives With: Spouse Available Help at Discharge: Family Type of Home: House Home Access: Stairs to enter Secretary/administrator of Steps: 4 Entrance Stairs-Rails: Right Home Layout: One level Bathroom Shower/Tub: Health visitor: Standard Home Adaptive Equipment: Bedside commode/3-in-1;Walker - standard;Walker - rolling;Wheelchair - manual Additional Comments: was only walking short distances at home since last hospital admission Prior Function Level of Independence: Independent with assistive device(s) Able to Take Stairs?: Yes Driving: Yes Vocation: Retired Musician: No difficulties Dominant Hand: Right    Cognition  Overall  Cognitive Status: Appears within functional limits for tasks  assessed/performed Arousal/Alertness: Awake/alert Orientation Level: Appears intact for tasks assessed Behavior During Session: [facility] for tasks performed    Extremity/Trunk Assessment Right Upper Extremity Assessment RUE ROM/Strength/Tone: [facility] for tasks assessed Left Upper Extremity Assessment LUE ROM/Strength/Tone: WFL for tasks assessed Right Lower Extremity Assessment RLE ROM/Strength/Tone: Within functional levels RLE Sensation: WFL - Light Touch RLE Coordination: WFL - gross/fine motor Left Lower Extremity Assessment LLE ROM/Strength/Tone: Within functional levels LLE Sensation: WFL - Light Touch LLE Coordination: WFL - gross/fine motor Trunk Assessment Trunk Assessment: Normal   Balance Balance Balance Assessed: Yes Static Sitting Balance Static Sitting - Balance Support: No upper extremity supported;Feet supported Static Sitting - Level of Assistance: 6: Modified independent (Device/Increase time) Static Sitting - Comment/# of Minutes: 3  End of Session PT - End of Session Equipment Utilized During Treatment: Oxygen Activity Tolerance: Treatment limited secondary to medical complications (Comment);Patient limited by fatigue (SaO2 71% on 4L O2 with walking) Patient left: in chair;with call bell/phone within reach;with family/visitor present Nurse Communication: Mobility status  GP     [name] 05/21/2012, 11:11 AM  [phone]

## 2012-05-21 NOTE — Progress Notes (Signed)
Patient ID: [name]  female  ZOX:[id number]    DOB: [birth year]    DOA: 05/20/2012  PCP: [name], MD  Assessment/Plan: Active Problems:  COPD exacerbation:  - Change to Xopenex and Atrovent due to tachycardia, and Pulmicort, continue IV steroids, levofloxacin patient is on 3 L O2 continuous at home   Dyspnea: - I will check a BNP, evaluated I will obtain 2-D echocardiogram, she may have pulmonary hypertension with cor pulmonale secondary to COPD and chronic respiratory failure   Chronic respiratory failure with hypercapnia: - As #1   Dyslipidemia: Continue Lipitor  GERD: cont PPI  DVT Prophylaxis:  Code Status: DNR  Disposition: PT evaluation done today, recommended skilled nursing facility   Subjective: Tinch very short of breath, feels that her lungs "have stopped working"  Objective: Weight change:   Intake/Output Summary (Last 24 hours) at 05/21/12 1524 Last data filed at 05/21/12 1335  Gross per 24 hour  Intake    363 ml  Output   1250 ml  Net   -887 ml   Blood pressure 153/86, pulse 94, temperature 98 F (36.7 C), temperature source Oral, resp. rate 20, height 5' (1.524 m), weight 62.5 kg (137 lb 12.6 oz), SpO2 93.00%.  Physical Exam: General: Alert and awake, oriented x3, not in any acute distress. HEENT: anicteric sclera, pupils reactive to light and accommodation, EOMI CVS: S1-S2 clear, no murmur rubs or gallops Chest: Chest tight with bilateral expiratory wheezing  Abdomen: soft nontender, nondistended, normal bowel sounds, no organomegaly Extremities: no cyanosis, clubbing or edema noted bilaterally Neuro: Cranial nerves II-XII intact, no focal neurological deficits  Lab Results: Basic Metabolic Panel:  Lab 05/21/12 8119 05/20/12 1204  NA 140 143  K 4.3 4.0  CL 102 104  CO2 28 30  GLUCOSE 157* 106*  BUN 17 17  CREATININE 0.54 0.72  CALCIUM 9.3 9.7  MG -- --  PHOS -- --   Liver Function Tests: No results found for this basename:  AST:2,ALT:2,ALKPHOS:2,BILITOT:2,PROT:2,ALBUMIN:2 in the last 168 hours No results found for this basename: LIPASE:2,AMYLASE:2 in the last 168 hours No results found for this basename: AMMONIA:2 in the last 168 hours CBC:  Lab 05/21/12 0506 05/20/12 1204  WBC 3.2* 5.2  NEUTROABS -- --  HGB 10.3* 11.6*  HCT 32.9* 37.1  MCV 90.4 91.8  PLT 242 267    Studies/Results: Dg Chest 2 View (if Patient Has Fever And/or Copd)  05/20/2012  *RADIOLOGY REPORT*  Clinical Data: Short of breath.  COPD.  Emphysema.  CHEST - 2 VIEW  Comparison: 04/27/2012.  Findings: Emphysema is present. The cardiopericardial silhouette appears within normal limits.  Bilateral pleural apical scarring. No airspace disease.  No effusion.  Hyperinflation.  Stable appearance of the costophrenic angles and lung bases posteriorly on the lateral view.  Partially visualized abdominal aortic stent graft.  IMPRESSION: Emphysema without active cardiopulmonary disease.   Original Report Authenticated By: Andreas Newport, M.D.    Dg Chest 2 View (if Patient Has Fever And/or Copd)  04/27/2012  *RADIOLOGY REPORT*  Clinical Data: Fever, chills, body  CHEST - 2 VIEW  Comparison: 03/29/2012  Findings: Cardiomediastinal silhouette is stable.  Mild hyperinflation again noted.  Stable chronic mild interstitial prominence.  Again noted tenting of the left posterior diaphragm. Left humeral prosthesis again noted.  No focal infiltrate or pulmonary edema.  IMPRESSION: No active disease.  Hyperinflation again noted.  Stable tenting of the left hemidiaphragm.   Original Report Authenticated By: Natasha Mead, M.D.  Nm Pulmonary Perf And Vent  05/20/2012  *RADIOLOGY REPORT*  Clinical Data: Shortness of breath  NM PULMONARY VENTILATION AND PERFUSION SCAN  Radiopharmaceutical: CURIE MAA TECHNETIUM TO 90M ALBUMIN AGGREGATED 45 mCi technetium 99 DTPA  Comparison: Chest x-ray from today  Findings: The ventilation phase of the study is markedly suboptimal in  this patient who could not inhale well.  Therefore only the perfusion study is interpretable.  The patient was given 6 mCi of technetium 59m MAA intravenously and images over the lungs were performed in various obliquities.  No perfusion abnormality is seen.  This scan is considered low probability for pulmonary embolism.  IMPRESSION: Very suboptimal ventilation.  However the perfusion is normal and the study is considered low probability for pulmonary embolism.   Original Report Authenticated By: Dwyane Dee, M.D.     Medications: Scheduled Meds:   . atorvastatin  10 mg Oral q1800  . budesonide  0.25 mg Nebulization BID  . docusate sodium  100 mg Oral BID  . furosemide  20 mg Oral BID  . heparin  5,000 Units Subcutaneous Q8H  . levalbuterol  0.63 mg Nebulization Q6H   And  . ipratropium  0.5 mg Nebulization Q6H  . levofloxacin  500 mg Oral Daily  . losartan  100 mg Oral Daily  . methylPREDNISolone (SOLU-MEDROL) injection  40 mg Intravenous Q6H  . pantoprazole  40 mg Oral Daily  . potassium chloride  20 mEq Oral Daily  . sodium chloride  3 mL Intravenous Q12H  . sodium chloride  3 mL Intravenous Q12H  . [DISCONTINUED] albuterol  2.5 mg Nebulization Q6H  . [DISCONTINUED] DSS  100 mg Oral BID  . [DISCONTINUED] ipratropium  0.5 mg Nebulization Q6H      LOS: 1 day   Acen Craun M.D. [facility] 05/21/2012, 3:24 PM Pager: [phone]  If 7PM-7AM, please contact night-coverage [email or web address] Password [email or web address]

## 2012-05-21 NOTE — Evaluation (Deleted)
Physical Therapy Evaluation Patient Details Name: [name] MRN: [id number] DOB: 09-Apr-1931 Today's Date: 05/21/2012 Time: 1030-1055 PT Time Calculation (min): 25 min  PT Assessment / Plan / Recommendation Clinical Impression       PT Assessment  Patient needs continued PT services    Follow Up Recommendations  Home health PT    Does the patient have the potential to tolerate intense rehabilitation      Barriers to Discharge   spouse is undergoing cancer treatments, 2 sisters also have health problems, may need ST-SNF, pt expressed anxiety about being weak and not being able to do more for herself    Equipment Recommendations       Recommendations for Other Services OT consult   Frequency Min 3X/week    Precautions / Restrictions Precautions Precaution Comments: monitor O2 sats Restrictions Weight Bearing Restrictions: No   Pertinent Vitals/Pain **SaO2 71% on 4L and then 6L O2 with walking SaO2 93% on 3L at rest*      Mobility  Bed Mobility Bed Mobility: Supine to Sit Supine to Sit: 6: Modified independent (Device/Increase time);HOB elevated Transfers Transfers: Sit to Stand;Stand to Sit Sit to Stand: From bed;With upper extremity assist;4: Min guard Stand to Sit: To chair/3-in-1;With upper extremity assist;With armrests;4: Min guard Details for Transfer Assistance: VCs hand placement Ambulation/Gait Ambulation/Gait Assistance: 4: Min guard Ambulation Distance (Feet): 20 Feet Assistive device: Rolling walker Gait Pattern: Decreased step length - right;Decreased step length - left;Within Functional Limits General Gait Details: SaO2 dropped to 71% on 4L and then 6L O2, 93% on 3L at rest    Shoulder Instructions     Exercises     PT Diagnosis: Difficulty walking  PT Problem List: Decreased activity tolerance;Cardiopulmonary status limiting activity PT Treatment Interventions: Gait training;Stair training;Therapeutic exercise;Functional mobility training    PT Goals Acute Rehab PT Goals PT Goal Formulation: With patient Time For Goal Achievement: 05/13/12 Potential to Achieve Goals: Fair Pt will Ambulate: with rolling walker;16 - 50 feet;with supervision (SaO2 greater than 90% on O2) PT Goal: Ambulate - Progress: Goal set today Pt will Go Up / Down Stairs: 3-5 stairs;with min assist;with rail(s) PT Goal: Up/Down Stairs - Progress: Goal set today Pt will Perform Home Exercise Program: Independently PT Goal: Perform Home Exercise Program - Progress: Goal set today  Visit Information  Last PT Received On: 05/21/12 Assistance Needed: +1 PT/OT Co-Evaluation/Treatment: Yes    Subjective Data  Subjective: "I want to walk more later today. I need to get my strength back." Patient Stated Goal: to walk farther   Prior Functioning  Home Living Lives With: Spouse Available Help at Discharge: Family Type of Home: House Home Access: Stairs to enter Secretary/administrator of Steps: 4 Entrance Stairs-Rails: Right Home Layout: One level Bathroom Shower/Tub: Health visitor: Standard Home Adaptive Equipment: Bedside commode/3-in-1;Walker - standard;Walker - rolling Additional Comments: was only walking short distances at home since last hospital admission Prior Function Level of Independence: Independent with assistive device(s) Able to Take Stairs?: Yes Driving: Yes Vocation: Retired Musician: No difficulties Dominant Hand: Right    Cognition  Overall Cognitive Status: Appears within functional limits for tasks assessed/performed Arousal/Alertness: Awake/alert Orientation Level: Appears intact for tasks assessed Behavior During Session: [facility] for tasks performed    Extremity/Trunk Assessment Right Upper Extremity Assessment RUE ROM/Strength/Tone: [facility] for tasks assessed Left Upper Extremity Assessment LUE ROM/Strength/Tone: [facility] for tasks assessed Right Lower Extremity Assessment RLE  ROM/Strength/Tone: Within functional levels RLE Sensation: WFL - Light Touch  RLE Coordination: WFL - gross/fine motor Left Lower Extremity Assessment LLE ROM/Strength/Tone: Within functional levels LLE Sensation: WFL - Light Touch LLE Coordination: WFL - gross/fine motor Trunk Assessment Trunk Assessment: Normal   Balance Balance Balance Assessed: Yes Static Sitting Balance Static Sitting - Balance Support: No upper extremity supported;Feet supported Static Sitting - Level of Assistance: 6: Modified independent (Device/Increase time) Static Sitting - Comment/# of Minutes: 3  End of Session PT - End of Session Equipment Utilized During Treatment: Oxygen Activity Tolerance: Treatment limited secondary to medical complications (Comment);Patient limited by fatigue (SaO2 71% on 4L O2 with walking) Patient left: in chair;with call bell/phone within reach;with family/visitor present Nurse Communication: Mobility status  GP     [name] 05/21/2012, 11:06 AM  [phone]

## 2012-05-21 NOTE — Progress Notes (Signed)
INITIAL ADULT NUTRITION ASSESSMENT Date: 05/21/2012   Time: 2:53 PM Reason for Assessment: consult  ASSESSMENT: Female 78 y.o.  Dx: COPD exacerbation  Hx:  Past Medical History  Diagnosis Date  . History of colonic polyps   . COPD (chronic obstructive pulmonary disease)   . Diverticulitis of colon   . GERD (gastroesophageal reflux disease)   . HTN (hypertension)   . Osteoarthritis   . Hyperlipemia   . Aortic aneurysm, abdominal   . Ovarian cyst, left   . Psoriasis   . Chronic respiratory failure     Uses home oxygen at 2 L  . ADRENAL MASS 05/04/2007  . GLAUCOMA 03/30/2007  . HEMORRHOIDS, INTERNAL 03/23/2008  . OSTEOPOROSIS 03/30/2007  . SCOLIOSIS 03/30/2007  . PONV (postoperative nausea and vomiting)    Past Surgical History  Procedure Date  . Kidney stone surgery   . Appendectomy   . Tonsillectomy   . Hemiarthroplasty shoulder fracture   . Cataract extraction, bilateral   . Abdominal aortic aneurysm repair   . Abdominal hysterectomy     Related Meds:  Scheduled Meds:   . ipratropium  0.5 mg Nebulization Q6H   And  . albuterol  2.5 mg Nebulization Q6H  . atorvastatin  10 mg Oral q1800  . docusate sodium  100 mg Oral BID  . furosemide  20 mg Oral BID  . heparin  5,000 Units Subcutaneous Q8H  . levofloxacin  500 mg Oral Daily  . losartan  100 mg Oral Daily  . methylPREDNISolone (SOLU-MEDROL) injection  40 mg Intravenous Q6H  . pantoprazole  40 mg Oral Daily  . potassium chloride  20 mEq Oral Daily  . sodium chloride  3 mL Intravenous Q12H  . sodium chloride  3 mL Intravenous Q12H  . [DISCONTINUED] DSS  100 mg Oral BID   Continuous Infusions:  PRN Meds:.sodium chloride, acetaminophen, acetaminophen, albuterol, ALPRAZolam, alum & mag hydroxide-simeth, chlorpheniramine-HYDROcodone, HYDROcodone-acetaminophen, ipratropium, morphine injection, ondansetron (ZOFRAN) IV, ondansetron, ranitidine, sodium chloride, [COMPLETED] technetium albumin aggregated, [EXPIRED]  technetium TC 59M diethylenetriame-pentaacetic acid, [EXPIRED] technetium TC 59M diethylenetriame-pentaacetic acid   Ht: 5' (152.4 cm)  Wt: 137 lb 12.6 oz (62.5 kg)  Ideal Wt: 45.5 kg % Ideal Wt: 137%  Usual Wt: 135-145 lbs % Usual Wt: 100%  Body mass index is 26.91 kg/(m^2).  Food/Nutrition Related Hx: poor appetite and intake due to illness PTA  Labs:  CMP     Component Value Date/Time   NA 140 05/21/2012 0506   K 4.3 05/21/2012 0506   CL 102 05/21/2012 0506   CO2 28 05/21/2012 0506   GLUCOSE 157* 05/21/2012 0506   BUN 17 05/21/2012 0506   CREATININE 0.54 05/21/2012 0506   CALCIUM 9.3 05/21/2012 0506   PROT 6.8 08/26/2011 0821   ALBUMIN 3.8 08/26/2011 0821   AST 18 08/26/2011 0821   ALT 15 08/26/2011 0821   ALKPHOS 77 08/26/2011 0821   BILITOT 0.2* 08/26/2011 0821   GFRNONAA 87* 05/21/2012 0506   GFRAA >90 05/21/2012 0506    CBC    Component Value Date/Time   WBC 3.2* 05/21/2012 0506   RBC 3.64* 05/21/2012 0506   HGB 10.3* 05/21/2012 0506   HCT 32.9* 05/21/2012 0506   PLT 242 05/21/2012 0506   MCV 90.4 05/21/2012 0506   MCH 28.3 05/21/2012 0506   MCHC 31.3 05/21/2012 0506   RDW 12.3 05/21/2012 0506   LYMPHSABS 1.3 07/18/2011 1355   MONOABS 0.3 07/18/2011 1355   EOSABS 0.2 07/18/2011 1355   BASOSABS  0.0 07/18/2011 1355    Intake: 35-50% Output:   Intake/Output Summary (Last 24 hours) at 05/21/12 1454 Last data filed at 05/21/12 1335  Gross per 24 hour  Intake    363 ml  Output   1250 ml  Net   -887 ml   Last BM (12/5)  Diet Order: Cardiac  Supplements/Tube Feeding: none at this time  IVF:    Estimated Nutritional Needs:   Kcal: 1560-1750 Protein: 62-74g Fluid: ~1.8 L/day  Pt admitted with COPD exacerbation. Pt reports she has been weak and not "eating like I should" at home PTA.  She questions whether or not she is weak because of poor intake. Pt's wt is currently stable (some variation over the past year, currently within range).  Pt not likely benefiting from  therapeutic diet restriction associated with Heart Healthy diet.  Will request diet liberalization. RD assisted pt with dinner and breakfast order.  No supplements desired at this time.  Intake improving.  NUTRITION DIAGNOSIS: -Inadequate oral intake (NI-2.1).  Status: Ongoing  RELATED TO: poor appetite, diet restrictions  AS EVIDENCE BY: pt requesting food not allowed on current diet order, report of poor intake  MONITORING/EVALUATION(Goals): 1.  Food/Beverage; pt averaging 50% of meals to meet >/=90% of estimated needs. 2.  Wt/wt change; deter loss  EDUCATION NEEDS: -Education needs addressed  INTERVENTION: 1.  Modify diet; recommend diet liberalization to Regular.  MD paged. 2.  General healthful diet; encourage intake.  Assisted with dinner and breakfast orders.   DOCUMENTATION CODES Per approved criteria  -Not Applicable   Loyce Dys, MS RD LDN Clinical Inpatient Dietitian Pager: [phone] Weekend/After hours pager: [phone]

## 2012-05-21 NOTE — Care Management Note (Signed)
    Page 1 of 2   05/25/2012     4:18:23 PM   CARE MANAGEMENT NOTE 05/25/2012  Patient:  [name], [name]   Account Number:  [id number]  Date Initiated:  05/21/2012  Documentation initiated by:  [facility]  Subjective/Objective Assessment:   ADMITTED W/SOB.COPD.READMIT 11/12-11/15/13-COPD.     Action/Plan:   FROM HOME W/SPOUSE.HOME 02-[facility].USED [facility]-HH RN/PT/OT ON PRIOR ADMISSION.   Anticipated DC Date:  05/25/2012   Anticipated DC Plan:  HOME W HOME HEALTH SERVICES      DC Planning Services  CM consult      [facility] Choice  HOME HEALTH   Choice offered to / List presented to:  C-6 Parent        HH arranged  HH-1 RN  HH-2 PT      [facility] agency  [facility]   Status of service:  Completed, signed off Medicare Important Message given?   (If response is "NO", the following Medicare IM given date fields will be blank) Date Medicare IM given:   Date Additional Medicare IM given:    Discharge Disposition:  HOME W HOME HEALTH SERVICES  Per UR Regulation:  Reviewed for med. necessity/level of care/duration of stay  If discussed at Long Length of Stay Meetings, dates discussed:    Comments:  05/25/12 Amanda Mcneill RN,BSN NCM [phone] PT/OT-RE-EVAL-HH RECOMMENDED.[facility] HHRN/PT.ALREADY USING [facility] FOR HOME 02.  05/23/12  1540p  Notified by SW that pt refusing SNF and would like HHC.  Spoke w/ pt - very pleasant, discussed SNF and HHC.  Pt and family do not want [name] SNF. They would like HHC and will have family assistance as well.  Pt previously had [facility] for hhc needs but currently has [employer]. Pt stated that insrance is changing to [employer] at the first of the year.  Will follow up w/ [facility] on 05/24/12.  Pts son [name] will be back in town on Monday or Tuesday and is very supportive.  Pt stated that husband has cancer and will be having surgery in January.  Pt appears to have [name] of support from family and friends - per pt.  Pt is to have [name] PT re-eval on  12/9.  [name], RNBSN   05/21/12 Amanda Ahlgrim RN,BSN NCM [phone] PT-SNF.

## 2012-05-21 NOTE — Evaluation (Signed)
Occupational Therapy Evaluation Patient Details Name: AALEAH HIRSCH MRN: [id number] DOB: 1931-03-02 Today's Date: 05/21/2012 Time: 1030-1055 OT Time Calculation (min): 25 min  OT Assessment / Plan / Recommendation Clinical Impression  Pt is a 78 yo female who presents with COPD exacerbation, on home O2. Pt desats very quickly dropping to 71% on 4L of O2. Pt's husband has cancer and is currently receiving radiation. Pt does not have any other family members who can assist her 24/7. Feel st snf placement may be beneficial. Pt very anxious regarding her and her husbands health. Skilled OT recommended to maximize ADL independence and safety for d.c to next venue.    OT Assessment  Patient needs continued OT Services    Follow Up Recommendations  SNF;Supervision/Assistance - 24 hour    Barriers to Discharge Decreased caregiver support    Equipment Recommendations  None recommended by OT    Recommendations for Other Services    Frequency  Min 2X/week    Precautions / Restrictions Precautions Precautions: Fall Precaution Comments: monitor O2 sats Restrictions Weight Bearing Restrictions: No   Pertinent Vitals/Pain Denied pain    ADL  Grooming: Performed;Brushing hair;Set up Where Assessed - Grooming: Unsupported sitting Upper Body Bathing: Simulated;Minimal assistance Where Assessed - Upper Body Bathing: Unsupported sitting Lower Body Bathing: Simulated;Minimal assistance Where Assessed - Lower Body Bathing: Supported sit to stand Upper Body Dressing: Simulated;Minimal assistance Where Assessed - Upper Body Dressing: Unsupported sitting Lower Body Dressing: Simulated;Moderate assistance Where Assessed - Lower Body Dressing: Supported sit to stand Toilet Transfer: Mining engineer Method: Sit to Barista: Other (comment) (to recliner.) Toileting - Clothing Manipulation and Hygiene: Simulated;Minimal assistance Where Assessed -  Engineer, mining and Hygiene: Standing Equipment Used: Rolling walker Transfers/Ambulation Related to ADLs: Pt ambulated a very short distance. O2 sats dropped to 71 on 4L. Even after increasing O2 to 6L. Sats Orr in 63s. ADL Comments: Pt fatigues very quickly. Very shaky. Max time and effort needed for all functional tasks.    OT Diagnosis: Generalized weakness  OT Problem List: Decreased activity tolerance;Decreased safety awareness;Cardiopulmonary status limiting activity;Decreased knowledge of use of DME or AE;Impaired UE functional use OT Treatment Interventions: Self-care/ADL training;Therapeutic activities;Energy conservation;DME and/or AE instruction;Patient/family education   OT Goals Acute Rehab OT Goals OT Goal Formulation: With patient Time For Goal Achievement: 06/04/12 Potential to Achieve Goals: Good ADL Goals Pt Will Perform Grooming: with supervision;Standing at sink;Sitting at sink ADL Goal: Grooming - Progress: Goal set today Pt Will Transfer to Toilet: with supervision;with DME;Ambulation ADL Goal: Toilet Transfer - Progress: Goal set today Pt Will Perform Toileting - Clothing Manipulation: with supervision;Sitting on 3-in-1 or toilet;Standing ADL Goal: Toileting - Clothing Manipulation - Progress: Goal set today Pt Will Perform Toileting - Hygiene: with supervision;Sit to stand from 3-in-1/toilet ADL Goal: Toileting - Hygiene - Progress: Goal set today Additional ADL Goal #1: Pt will complete all aspects of bathing and dressing with supervision initiating prn RBs without prompting. ADL Goal: Additional Goal #1 - Progress: Goal set today  Visit Information  Last OT Received On: 05/21/12 Assistance Needed: +1 PT/OT Co-Evaluation/Treatment: Yes    Subjective Data  Subjective: Can't I walk a little? I'm getting so weak. Patient Stated Goal: Not asked.   Prior Functioning     Home Living Lives With: Spouse Available Help at Discharge:  Family Type of Home: House Home Access: Stairs to enter [employer] of Steps: 4 Entrance Stairs-Rails: Right Home Layout: One level Bathroom Shower/Tub: Walk-in  shower Bathroom Toilet: Standard Home Adaptive Equipment: Bedside commode/3-in-1;Walker - standard;Walker - rolling;Wheelchair - manual Additional Comments: was only walking short distances at home since last hospital admission Prior Function Level of Independence: Independent with assistive device(s) Able to Take Stairs?: Yes Driving: Yes Vocation: Retired Musician: No difficulties Dominant Hand: Right         Vision/Perception     Cognition  Overall Cognitive Status: Appears within functional limits for tasks assessed/performed Arousal/Alertness: Awake/alert Orientation Level: Appears intact for tasks assessed Behavior During Session: [facility] for tasks performed    Extremity/Trunk Assessment Right Upper Extremity Assessment RUE ROM/Strength/Tone: Deficits RUE ROM/Strength/Tone Deficits: 2* RA Left Upper Extremity Assessment LUE ROM/Strength/Tone: Deficits LUE ROM/Strength/Tone Deficits: 2* RA Right Lower Extremity Assessment RLE ROM/Strength/Tone: Within functional levels RLE Sensation: WFL - Light Touch RLE Coordination: WFL - gross/fine motor Left Lower Extremity Assessment LLE ROM/Strength/Tone: Within functional levels LLE Sensation: WFL - Light Touch LLE Coordination: WFL - gross/fine motor Trunk Assessment Trunk Assessment: Normal     Mobility Bed Mobility Bed Mobility: Supine to Sit Supine to Sit: 6: Modified independent (Device/Increase time);HOB elevated Transfers Sit to Stand: From bed;With upper extremity assist;4: Min guard Stand to Sit: To chair/3-in-1;With upper extremity assist;With armrests;4: Min guard Details for Transfer Assistance: VCs hand placement     Shoulder Instructions     Exercise     Balance Balance Balance Assessed: Yes Static Sitting  Balance Static Sitting - Balance Support: No upper extremity supported;Feet supported Static Sitting - Level of Assistance: 6: Modified independent (Device/Increase time) Static Sitting - Comment/# of Minutes: 3   End of Session OT - End of Session Equipment Utilized During Treatment: Gait belt Activity Tolerance: Patient limited by fatigue Patient left: in chair;with call bell/phone within reach;with family/visitor present Nurse Communication: Other (comment) (dropping O2 sats.)  GO     Carmine Carrozza A OTR/L [phone] 05/21/2012, 12:21 PM

## 2012-05-22 DIAGNOSIS — F411 Generalized anxiety disorder: Secondary | ICD-10-CM

## 2012-05-22 DIAGNOSIS — F419 Anxiety disorder, unspecified: Secondary | ICD-10-CM | POA: Diagnosis present

## 2012-05-22 LAB — CBC
Hemoglobin: 10.8 g/dL — ABNORMAL LOW (ref 12.0–15.0)
MCH: 28.8 pg (ref 26.0–34.0)
MCV: 91.5 fL (ref 78.0–100.0)
Platelets: 275 10*3/uL (ref 150–400)
RBC: 3.75 MIL/uL — ABNORMAL LOW (ref 3.87–5.11)
WBC: 9.6 10*3/uL (ref 4.0–10.5)

## 2012-05-22 LAB — BASIC METABOLIC PANEL
CO2: 33 mEq/L — ABNORMAL HIGH (ref 19–32)
Calcium: 9.8 mg/dL (ref 8.4–10.5)
Chloride: 100 mEq/L (ref 96–112)
Glucose, Bld: 145 mg/dL — ABNORMAL HIGH (ref 70–99)
Sodium: 141 mEq/L (ref 135–145)

## 2012-05-22 MED ORDER — ALPRAZOLAM 0.5 MG PO TABS
0.5000 mg | ORAL_TABLET | Freq: Three times a day (TID) | ORAL | Status: DC
  Administered 2012-05-22 – 2012-05-25 (×9): 0.5 mg via ORAL
  Filled 2012-05-22 (×9): qty 1

## 2012-05-22 MED ORDER — PANTOPRAZOLE SODIUM 40 MG PO TBEC
40.0000 mg | DELAYED_RELEASE_TABLET | Freq: Every day | ORAL | Status: DC
  Administered 2012-05-22 – 2012-05-25 (×4): 40 mg via ORAL
  Filled 2012-05-22 (×6): qty 1

## 2012-05-22 NOTE — Progress Notes (Signed)
Patient states today is the first day she felt like the breathing treatment helped.  Crying during conversation because her husband has cancer and she is unable to help him.  Expressed that her son does not want her to go to a SNF until he can talk about it with Md/SW.  He will be in town tomorrow.

## 2012-05-22 NOTE — Progress Notes (Signed)
Provided patient handout on COPD, home oxygen and aspiration precautions

## 2012-05-22 NOTE — Progress Notes (Signed)
Patient ID: [name]  female  WUJ:[id number]    DOB: 1930-10-13    DOA: 05/20/2012  PCP: [name], MD  Assessment/Plan: Active Problems:  COPD exacerbation: improving - Cont Xopenex and Atrovent, Pulmicort, continue IV steroids, levofloxacin, PPI  - patient is on 3 L O2 continuous at home   Dyspnea: likely sec to ac on chr resp failure and COPD exacerbation -BNP 383   Chronic respiratory failure with hypercapnia: - As #1   Dyslipidemia: Continue Lipitor  GERD: cont PPI  Anxiety: Patient appears to be anxious and overwhelmed with new diagnosis of cancer in her husband, she is tearful and wants to be with him rather than in rehab. Per her request, changed Xanax TID PRN to TID.    DVT Prophylaxis:  Code Status: DNR  Disposition: PT evaluation was done yesterday and was recommended skilled nursing facility however patient is somewhat upset about it and wants to go home and be with her husband. Will do repeat PT evaluation on Monday.    Subjective: Shortness of breath improving, however patient is somewhat anxious and tearful about her husband's sickness   Objective: Weight change:   Intake/Output Summary (Last 24 hours) at 05/22/12 1230 Last data filed at 05/22/12 1228  Gross per 24 hour  Intake    203 ml  Output   2200 ml  Net  -1997 ml   Blood pressure 159/88, pulse 92, temperature 98.7 F (37.1 C), temperature source Oral, resp. rate 18, height 5' (1.524 m), weight 62.5 kg (137 lb 12.6 oz), SpO2 97.00%.  Physical Exam: General: Alert and awake, oriented x3, not in any acute distress. HEENT: anicteric sclera, PERLA, EOMI CVS: S1-S2 clear, no murmur rubs or gallops Chest: mild bilateral expiratory wheezing sig improved from yesterday Abdomen: soft nontender, nondistended, normal bowel sounds, no organomegaly Extremities: no cyanosis, clubbing or edema noted bilaterally Neuro:non focal  Lab Results: Basic Metabolic Panel:  Lab 05/22/12 9562 05/21/12 0506  NA  141 140  K 4.6 4.3  CL 100 102  CO2 33* 28  GLUCOSE 145* 157*  BUN 27* 17  CREATININE 0.74 0.54  CALCIUM 9.8 9.3  MG -- --  PHOS -- --   Liver Function Tests: No results found for this basename: AST:2,ALT:2,ALKPHOS:2,BILITOT:2,PROT:2,ALBUMIN:2 in the last 168 hours No results found for this basename: LIPASE:2,AMYLASE:2 in the last 168 hours No results found for this basename: AMMONIA:2 in the last 168 hours CBC:  Lab 05/22/12 0605 05/21/12 0506  WBC 9.6 3.2*  NEUTROABS -- --  HGB 10.8* 10.3*  HCT 34.3* 32.9*  MCV 91.5 90.4  PLT 275 242    Studies/Results: Dg Chest 2 View (if Patient Has Fever And/or Copd)  05/20/2012  *RADIOLOGY REPORT*  Clinical Data: Short of breath.  COPD.  Emphysema.  CHEST - 2 VIEW  Comparison: 04/27/2012.  Findings: Emphysema is present. The cardiopericardial silhouette appears within normal limits.  Bilateral pleural apical scarring. No airspace disease.  No effusion.  Hyperinflation.  Stable appearance of the costophrenic angles and lung bases posteriorly on the lateral view.  Partially visualized abdominal aortic stent graft.  IMPRESSION: Emphysema without active cardiopulmonary disease.   Original Report Authenticated By: Andreas Newport, M.D.    Dg Chest 2 View (if Patient Has Fever And/or Copd)  04/27/2012  *RADIOLOGY REPORT*  Clinical Data: Fever, chills, body  CHEST - 2 VIEW  Comparison: 03/29/2012  Findings: Cardiomediastinal silhouette is stable.  Mild hyperinflation again noted.  Stable chronic mild interstitial prominence.  Again noted  tenting of the left posterior diaphragm. Left humeral prosthesis again noted.  No focal infiltrate or pulmonary edema.  IMPRESSION: No active disease.  Hyperinflation again noted.  Stable tenting of the left hemidiaphragm.   Original Report Authenticated By: Natasha Mead, M.D.    Nm Pulmonary Perf And Vent  05/20/2012  *RADIOLOGY REPORT*  Clinical Data: Shortness of breath  NM PULMONARY VENTILATION AND PERFUSION SCAN   Radiopharmaceutical: CURIE MAA TECHNETIUM TO 74M ALBUMIN AGGREGATED 45 mCi technetium 99 DTPA  Comparison: Chest x-ray from today  Findings: The ventilation phase of the study is markedly suboptimal in this patient who could not inhale well.  Therefore only the perfusion study is interpretable.  The patient was given 6 mCi of technetium 65m MAA intravenously and images over the lungs were performed in various obliquities.  No perfusion abnormality is seen.  This scan is considered low probability for pulmonary embolism.  IMPRESSION: Very suboptimal ventilation.  However the perfusion is normal and the study is considered low probability for pulmonary embolism.   Original Report Authenticated By: Dwyane Dee, M.D.     Medications: Scheduled Meds:    . ALPRAZolam  0.5 mg Oral TID  . atorvastatin  10 mg Oral q1800  . budesonide  0.25 mg Nebulization BID  . docusate sodium  100 mg Oral BID  . furosemide  20 mg Oral BID  . heparin  5,000 Units Subcutaneous Q8H  . levalbuterol  0.63 mg Nebulization Q6H   And  . ipratropium  0.5 mg Nebulization Q6H  . levofloxacin  500 mg Oral Daily  . losartan  100 mg Oral Daily  . methylPREDNISolone (SOLU-MEDROL) injection  40 mg Intravenous Q6H  . pantoprazole  40 mg Oral Daily  . potassium chloride  20 mEq Oral Daily  . sodium chloride  3 mL Intravenous Q12H  . sodium chloride  3 mL Intravenous Q12H  . [DISCONTINUED] albuterol  2.5 mg Nebulization Q6H  . [DISCONTINUED] ipratropium  0.5 mg Nebulization Q6H      LOS: 2 days   RAI,RIPUDEEP M.D. [facility] 05/22/2012, 12:30 PM Pager: [phone]  If 7PM-7AM, please contact night-coverage [email or web address] Password [email or web address]

## 2012-05-23 LAB — BASIC METABOLIC PANEL
BUN: 33 mg/dL — ABNORMAL HIGH (ref 6–23)
CO2: 30 mEq/L (ref 19–32)
Calcium: 9.6 mg/dL (ref 8.4–10.5)
Chloride: 98 mEq/L (ref 96–112)
Creatinine, Ser: 0.9 mg/dL (ref 0.50–1.10)
Glucose, Bld: 150 mg/dL — ABNORMAL HIGH (ref 70–99)

## 2012-05-23 LAB — CBC
HCT: 34.6 % — ABNORMAL LOW (ref 36.0–46.0)
MCH: 28.8 pg (ref 26.0–34.0)
MCV: 91.3 fL (ref 78.0–100.0)
Platelets: 300 10*3/uL (ref 150–400)
RBC: 3.79 MIL/uL — ABNORMAL LOW (ref 3.87–5.11)
WBC: 9.6 10*3/uL (ref 4.0–10.5)

## 2012-05-23 MED ORDER — METHYLPREDNISOLONE SODIUM SUCC 40 MG IJ SOLR
40.0000 mg | Freq: Three times a day (TID) | INTRAMUSCULAR | Status: DC
  Administered 2012-05-23 – 2012-05-25 (×6): 40 mg via INTRAVENOUS
  Filled 2012-05-23 (×9): qty 1

## 2012-05-23 NOTE — Progress Notes (Signed)
Attempted to meet with Pt for d/c planning.  Pt on the phone and asked that CSW come back at a later time.  CSW to continue to follow.  Providence Crosby, LCSWA Clinical Social Work [phone]

## 2012-05-23 NOTE — Progress Notes (Signed)
Clinical Social Work Department BRIEF PSYCHOSOCIAL ASSESSMENT 05/23/2012  Patient:  [name], [name]     Account Number:  [id number]     Admit date:  05/20/2012  Clinical Social Worker:  Doroteo Glassman  Date/Time:  05/23/2012 02:55 PM  Referred by:  Physician  Date Referred:  05/23/2012 Referred for  SNF Placement   Other Referral:   Interview type:  Patient Other interview type:    PSYCHOSOCIAL DATA Living Status:  FACILITY Admitted from facility:   Level of care:   Primary support name:  Richard Wiebelhaus Primary support relationship to patient:  SPOUSE Degree of support available:   adequate    CURRENT CONCERNS Current Concerns  Post-Acute Placement   Other Concerns:    SOCIAL WORK ASSESSMENT / PLAN Met with Pt to discuss d/c plans.    Pt was adamant that she is not going to SNF; she has her husband and children to care for her upon d/c.    Pt requesting HH.    CSW to notify RNCM.    CSW thanked Pt for her time.   Assessment/plan status:  No Further Intervention Required Other assessment/ plan:   Information/referral to community resources:   n/a    PATIENT'S/FAMILY'S RESPONSE TO PLAN OF CARE: Pt thanked CSW for time and assistance.   Providence Crosby, LCSWA Clinical Social Work [phone]

## 2012-05-23 NOTE — Progress Notes (Signed)
Patient ID: [name]  female  [id number]    DOB: 1931/04/02    DOA: 05/20/2012  PCP: [name], MD  Assessment/Plan: Active Problems:  COPD exacerbation: improving - Cont Xopenex and Atrovent, Pulmicort, continue IV steroids (tapered to 40 mg TID, will transition to oral prednisone tomorrow), levofloxacin, PPI  - patient is on 3 L O2 continuous at home   Dyspnea: likely sec to ac on chr resp failure and COPD exacerbation -BNP 383   Chronic respiratory failure with hypercapnia: - As #1   Dyslipidemia: Continue Lipitor  GERD: cont PPI  Anxiety: Continue Xanax, much improved today  DVT Prophylaxis:  Code Status: DNR  Disposition: Patient absolutely does not want to be in skilled nursing facility or rehabilitation, and will repeat PT evaluation tomorrow as patient's respiratory status has significantly improved now and reasses. Discussed with family at bedside.  Subjective: Patient feels a whole lot improved, shortness of breath and wheezing improving  Objective: Weight change:   Intake/Output Summary (Last 24 hours) at 05/23/12 1249 Last data filed at 05/22/12 1834  Gross per 24 hour  Intake    290 ml  Output      0 ml  Net    290 ml   Blood pressure 165/85, pulse 102, temperature 97.9 F (36.6 C), temperature source Oral, resp. rate 19, height 5' (1.524 m), weight 62.5 kg (137 lb 12.6 oz), SpO2 98.00%.  Physical Exam: General: Alert and awake, oriented x3, NAD HEENT: anicteric sclera, PERLA, EOMI CVS: S1-S2 clear, no murmur rubs or gallops Chest: mild bilateral expiratory wheezing significantly improved from admission  Abdomen: soft nontender, nondistended, normal bowel sounds, no organomegaly Extremities: no c/c/e Neuro:non focal  Lab Results: Basic Metabolic Panel:  Lab 05/23/12 8119 05/22/12 0605  NA 138 141  K 3.7 4.6  CL 98 100  CO2 30 33*  GLUCOSE 150* 145*  BUN 33* 27*  CREATININE 0.90 0.74  CALCIUM 9.6 9.8  MG -- --  PHOS -- --   Liver  Function Tests: No results found for this basename: AST:2,ALT:2,ALKPHOS:2,BILITOT:2,PROT:2,ALBUMIN:2 in the last 168 hours No results found for this basename: LIPASE:2,AMYLASE:2 in the last 168 hours No results found for this basename: AMMONIA:2 in the last 168 hours CBC:  Lab 05/23/12 0530 05/22/12 0605  WBC 9.6 9.6  NEUTROABS -- --  HGB 10.9* 10.8*  HCT 34.6* 34.3*  MCV 91.3 91.5  PLT 300 275    Studies/Results: Dg Chest 2 View (if Patient Has Fever And/or Copd)  05/20/2012  *RADIOLOGY REPORT*  Clinical Data: Short of breath.  COPD.  Emphysema.  CHEST - 2 VIEW  Comparison: 04/27/2012.  Findings: Emphysema is present. The cardiopericardial silhouette appears within normal limits.  Bilateral pleural apical scarring. No airspace disease.  No effusion.  Hyperinflation.  Stable appearance of the costophrenic angles and lung bases posteriorly on the lateral view.  Partially visualized abdominal aortic stent graft.  IMPRESSION: Emphysema without active cardiopulmonary disease.   Original Report Authenticated By: Andreas Newport, M.D.    Dg Chest 2 View (if Patient Has Fever And/or Copd)  04/27/2012  *RADIOLOGY REPORT*  Clinical Data: Fever, chills, body  CHEST - 2 VIEW  Comparison: 03/29/2012  Findings: Cardiomediastinal silhouette is stable.  Mild hyperinflation again noted.  Stable chronic mild interstitial prominence.  Again noted tenting of the left posterior diaphragm. Left humeral prosthesis again noted.  No focal infiltrate or pulmonary edema.  IMPRESSION: No active disease.  Hyperinflation again noted.  Stable tenting of the left  hemidiaphragm.   Original Report Authenticated By: Natasha Mead, M.D.    Nm Pulmonary Perf And Vent  05/20/2012  *RADIOLOGY REPORT*  Clinical Data: Shortness of breath  NM PULMONARY VENTILATION AND PERFUSION SCAN  Radiopharmaceutical: CURIE MAA TECHNETIUM TO 66M ALBUMIN AGGREGATED 45 mCi technetium 99 DTPA  Comparison: Chest x-ray from today  Findings: The  ventilation phase of the study is markedly suboptimal in this patient who could not inhale well.  Therefore only the perfusion study is interpretable.  The patient was given 6 mCi of technetium 15m MAA intravenously and images over the lungs were performed in various obliquities.  No perfusion abnormality is seen.  This scan is considered low probability for pulmonary embolism.  IMPRESSION: Very suboptimal ventilation.  However the perfusion is normal and the study is considered low probability for pulmonary embolism.   Original Report Authenticated By: Dwyane Dee, M.D.     Medications: Scheduled Meds:    . ALPRAZolam  0.5 mg Oral TID  . atorvastatin  10 mg Oral q1800  . budesonide  0.25 mg Nebulization BID  . docusate sodium  100 mg Oral BID  . furosemide  20 mg Oral BID  . heparin  5,000 Units Subcutaneous Q8H  . levalbuterol  0.63 mg Nebulization Q6H   And  . ipratropium  0.5 mg Nebulization Q6H  . levofloxacin  500 mg Oral Daily  . losartan  100 mg Oral Daily  . methylPREDNISolone (SOLU-MEDROL) injection  40 mg Intravenous Q6H  . pantoprazole  40 mg Oral QAC breakfast  . potassium chloride  20 mEq Oral Daily  . sodium chloride  3 mL Intravenous Q12H  . sodium chloride  3 mL Intravenous Q12H      LOS: 3 days   RAI,RIPUDEEP M.D. [facility] 05/23/2012, 12:49 PM Pager: [phone]  If 7PM-7AM, please contact night-coverage [email or web address] Password [email or web address]

## 2012-05-24 LAB — BASIC METABOLIC PANEL
BUN: 32 mg/dL — ABNORMAL HIGH (ref 6–23)
Calcium: 9.3 mg/dL (ref 8.4–10.5)
Chloride: 100 mEq/L (ref 96–112)
Creatinine, Ser: 0.87 mg/dL (ref 0.50–1.10)
GFR calc Af Amer: 72 mL/min — ABNORMAL LOW (ref >90)

## 2012-05-24 LAB — CBC
HCT: 33.9 % — ABNORMAL LOW (ref 36.0–46.0)
MCH: 28.9 pg (ref 26.0–34.0)
MCHC: 32.4 g/dL (ref 30.0–36.0)
MCV: 89 fL (ref 78.0–100.0)
Platelets: 296 10*3/uL (ref 150–400)
RDW: 12.5 % (ref 11.5–15.5)
WBC: 8.8 10*3/uL (ref 4.0–10.5)

## 2012-05-24 MED ORDER — ALBUTEROL SULFATE (5 MG/ML) 0.5% IN NEBU
2.5000 mg | INHALATION_SOLUTION | RESPIRATORY_TRACT | Status: DC | PRN
  Administered 2012-05-25: 2.5 mg via RESPIRATORY_TRACT
  Filled 2012-05-24: qty 0.5

## 2012-05-24 MED ORDER — IPRATROPIUM BROMIDE 0.02 % IN SOLN
0.5000 mg | RESPIRATORY_TRACT | Status: DC | PRN
  Administered 2012-05-25: 0.5 mg via RESPIRATORY_TRACT
  Filled 2012-05-24: qty 2.5

## 2012-05-24 MED ORDER — ALBUTEROL SULFATE (5 MG/ML) 0.5% IN NEBU
2.5000 mg | INHALATION_SOLUTION | Freq: Four times a day (QID) | RESPIRATORY_TRACT | Status: DC
  Administered 2012-05-24 – 2012-05-25 (×6): 2.5 mg via RESPIRATORY_TRACT
  Filled 2012-05-24 (×8): qty 0.5

## 2012-05-24 MED ORDER — IPRATROPIUM BROMIDE 0.02 % IN SOLN
0.5000 mg | Freq: Four times a day (QID) | RESPIRATORY_TRACT | Status: DC
  Administered 2012-05-24 – 2012-05-25 (×6): 0.5 mg via RESPIRATORY_TRACT
  Filled 2012-05-24 (×8): qty 2.5

## 2012-05-24 NOTE — Progress Notes (Signed)
Patient ID: [name]  female  ZOX:[id number]    DOB: 1931/06/14    DOA: 05/20/2012  PCP: [name], MD  Assessment/Plan: Active Problems:  COPD exacerbation: improving progressively - Cont Xopenex and Atrovent, Pulmicort, and cont IV steroids (? Allergic to prednisone, will check with patient), levofloxacin, PPI  - patient is on 3 L O2 continuous at home   Dyspnea: likely sec to ac on chr resp failure and COPD exacerbation, improving -BNP 383   Chronic respiratory failure with hypercapnia: - As #1   Dyslipidemia: Continue Lipitor  GERD: cont PPI  Anxiety: Continue Xanax, much improved today  DVT Prophylaxis:  Code Status: DNR  Disposition: Discussed with PT, to reevaluate today. Patient's COPD exacerbation has significantly improved from the time of admission, it will be good time to establish a baseline now. Patient is acceptable to short term SNF, if it comes to that on re-eval, however she prefers to be dc'ed home if she can.  Subjective: S: shortness of breath and wheezing improving, feels depressed about SNF idea  Objective: Weight change:   Intake/Output Summary (Last 24 hours) at 05/24/12 1354 Last data filed at 05/24/12 1145  Gross per 24 hour  Intake    240 ml  Output   1100 ml  Net   -860 ml   Blood pressure 153/81, pulse 87, temperature 97.5 F (36.4 C), temperature source Oral, resp. rate 20, height 5' (1.524 m), weight 62.5 kg (137 lb 12.6 oz), SpO2 98.00%.  Physical Exam: General: Alert and awake, oriented x3, NAD, sad HEENT: anicteric sclera, PERLA, EOMI CVS: S1-S2 clear, no murmur rubs or gallops Chest: wheezing significantly improved from admission  Abdomen: soft nontender, nondistended, normal bowel sounds, no organomegaly Extremities: no c/c/e Neuro:non focal  Lab Results: Basic Metabolic Panel:  Lab 05/24/12 8119 05/23/12 0530  NA 140 138  K 4.3 3.7  CL 100 98  CO2 30 30  GLUCOSE 137* 150*  BUN 32* 33*  CREATININE 0.87 0.90   CALCIUM 9.3 9.6  MG -- --  PHOS -- --   CBC:  Lab 05/24/12 0510 05/23/12 0530  WBC 8.8 9.6  NEUTROABS -- --  HGB 11.0* 10.9*  HCT 33.9* 34.6*  MCV 89.0 91.3  PLT 296 300    Studies/Results: Dg Chest 2 View (if Patient Has Fever And/or Copd)  05/20/2012  *RADIOLOGY REPORT*  Clinical Data: Short of breath.  COPD.  Emphysema.  CHEST - 2 VIEW  Comparison: 04/27/2012.  Findings: Emphysema is present. The cardiopericardial silhouette appears within normal limits.  Bilateral pleural apical scarring. No airspace disease.  No effusion.  Hyperinflation.  Stable appearance of the costophrenic angles and lung bases posteriorly on the lateral view.  Partially visualized abdominal aortic stent graft.  IMPRESSION: Emphysema without active cardiopulmonary disease.   Original Report Authenticated By: Andreas Newport, M.D.    Dg Chest 2 View (if Patient Has Fever And/or Copd)  04/27/2012  *RADIOLOGY REPORT*  Clinical Data: Fever, chills, body  CHEST - 2 VIEW  Comparison: 03/29/2012  Findings: Cardiomediastinal silhouette is stable.  Mild hyperinflation again noted.  Stable chronic mild interstitial prominence.  Again noted tenting of the left posterior diaphragm. Left humeral prosthesis again noted.  No focal infiltrate or pulmonary edema.  IMPRESSION: No active disease.  Hyperinflation again noted.  Stable tenting of the left hemidiaphragm.   Original Report Authenticated By: Natasha Mead, M.D.    Nm Pulmonary Perf And Vent  05/20/2012  *RADIOLOGY REPORT*  Clinical Data: Shortness of  breath  NM PULMONARY VENTILATION AND PERFUSION SCAN  Radiopharmaceutical: CURIE MAA TECHNETIUM TO 49M ALBUMIN AGGREGATED 45 mCi technetium 99 DTPA  Comparison: Chest x-ray from today  Findings: The ventilation phase of the study is markedly suboptimal in this patient who could not inhale well.  Therefore only the perfusion study is interpretable.  The patient was given 6 mCi of technetium 53m MAA intravenously and images  over the lungs were performed in various obliquities.  No perfusion abnormality is seen.  This scan is considered low probability for pulmonary embolism.  IMPRESSION: Very suboptimal ventilation.  However the perfusion is normal and the study is considered low probability for pulmonary embolism.   Original Report Authenticated By: Dwyane Dee, M.D.     Medications: Scheduled Meds:    . albuterol  2.5 mg Nebulization QID  . ALPRAZolam  0.5 mg Oral TID  . atorvastatin  10 mg Oral q1800  . budesonide  0.25 mg Nebulization BID  . docusate sodium  100 mg Oral BID  . furosemide  20 mg Oral BID  . heparin  5,000 Units Subcutaneous Q8H  . ipratropium  0.5 mg Nebulization QID  . levofloxacin  500 mg Oral Daily  . losartan  100 mg Oral Daily  . methylPREDNISolone (SOLU-MEDROL) injection  40 mg Intravenous Q8H  . pantoprazole  40 mg Oral QAC breakfast  . potassium chloride  20 mEq Oral Daily  . sodium chloride  3 mL Intravenous Q12H  . sodium chloride  3 mL Intravenous Q12H  . [DISCONTINUED] ipratropium  0.5 mg Nebulization Q6H  . [DISCONTINUED] levalbuterol  0.63 mg Nebulization Q6H      LOS: 4 days   Elisse Pennick M.D. [facility] 05/24/2012, 1:54 PM Pager: [phone]  If 7PM-7AM, please contact night-coverage [email or web address] Password [email or web address]

## 2012-05-24 NOTE — Progress Notes (Signed)
Occupational Therapy Treatment Patient Details Name: [name] MRN: [id number] DOB: 01/14/1931 Today's Date: 05/24/2012 Time: 0865-7846 OT Time Calculation (min): 21 min  OT Assessment / Plan / Recommendation Comments on Treatment Session Pt's O2 sats remaining in mid 90s with activity although now presenting with increased HR. Feel snf is safest option 2* husbands inability to care for pt at home.    Follow Up Recommendations  SNF;Supervision/Assistance - 24 hour (If pt returns home than HHOT.)    Barriers to Discharge       Equipment Recommendations       Recommendations for Other Services    Frequency Min 2X/week   Plan Discharge plan remains appropriate    Precautions / Restrictions Precautions Precautions: Fall Precaution Comments: monitor O2 sats & HR   Pertinent Vitals/Pain Pt denied pain.    ADL  Grooming: Brushing hair;Performed;Set up Where Assessed - Grooming: Unsupported sitting Upper Body Dressing: Performed;Set up Where Assessed - Upper Body Dressing: Unsupported sitting Lower Body Dressing: Performed;Set up Where Assessed - Lower Body Dressing: Unsupported sitting;Other (comment) (don/doff socks.) Toilet Transfer: Simulated;Min Pension scheme manager Method: Sit to Barista: Other (comment) (recliner) ADL Comments: Pt continues to be very shaky w/HR ranging from 100-149 with activity. Again educated pt on pacing self and taking frequent RBs.    OT Diagnosis:    OT Problem List:   OT Treatment Interventions:     OT Goals ADL Goals ADL Goal: Grooming - Progress: Progressing toward goals ADL Goal: Toilet Transfer - Progress: Progressing toward goals ADL Goal: Additional Goal #1 - Progress: Progressing toward goals  Visit Information  Last OT Received On: 05/24/12 Assistance Needed: +1 PT/OT Co-Evaluation/Treatment: Yes    Subjective Data  Subjective: I guess its up to the doctor where I go from here.   Prior  Functioning       Cognition  Overall Cognitive Status: Appears within functional limits for tasks assessed/performed Arousal/Alertness: Awake/alert Orientation Level: Appears intact for tasks assessed Behavior During Session: [facility] for tasks performed    Mobility  Shoulder Instructions Bed Mobility Supine to Sit: 6: Modified independent (Device/Increase time) Transfers Sit to Stand: 4: Min guard;With upper extremity assist;From bed Stand to Sit: 4: Min guard;With upper extremity assist;To chair/3-in-1;With armrests Details for Transfer Assistance: VCs hand placement        Exercises      Balance     End of Session OT - End of Session Activity Tolerance: Patient limited by fatigue Patient left: in chair;with call bell/phone within reach;with family/visitor present  GO     Saachi Zale A OTR/L [phone] 05/24/2012, 3:39 PM

## 2012-05-24 NOTE — Progress Notes (Signed)
Physical Therapy Treatment Patient Details Name: [name] MRN: [id number] DOB: [birth year] Today's Date: 05/24/2012 Time: 4098-1191 PT Time Calculation (min): 19 min  PT Assessment / Plan / Recommendation Comments on Treatment Session  Pt. really wants to return home. pt. is mobilzing with min to min guard. HR is increased with activity. Pt. has home O2. Pt. states she has family in/out. Spouse is not able to assist pt. Pt. could benefit from SNF but desires DC to home. Recommend HHPT.    Follow Up Recommendations  SNF;Home health PT (if DC home.)     Does the patient have the potential to tolerate intense rehabilitation     Barriers to Discharge        Equipment Recommendations  None recommended by PT    Recommendations for Other Services    Frequency Min 3X/week   Plan Discharge plan remains appropriate;Frequency remains appropriate    Precautions / Restrictions Precautions Precautions: Fall Precaution Comments: monitor O2 sats & HR   Pertinent Vitals/Pain HR up to 144 while ambulating x 100 Ft. sats > 92% 3 l/    Mobility  Bed Mobility Supine to Sit: 6: Modified independent (Device/Increase time) Transfers Sit to Stand: 4: Min guard;With upper extremity assist;From bed Stand to Sit: 4: Min guard;With upper extremity assist;To chair/3-in-1;With armrests Details for Transfer Assistance: VCs hand placement  Ambulation/Gait Ambulation/Gait Assistance: 4: Min assist Ambulation Distance (Feet): 100 Feet Assistive device: Rolling walker Ambulation/Gait Assistance Details: cedreased, cues to stop to talk, not talk and walk, pursed lip breathing instruction. Gait Pattern: Decreased stride length General Gait Details: sats . 92% 3 l. HR 92-144, RNaware,    Exercises     PT Diagnosis:    PT Problem List:   PT Treatment Interventions:     PT Goals Acute Rehab PT Goals Pt will Ambulate: 16 - 50 feet;with supervision (sats > 92% 2 l.)  Visit Information  Last PT  Received On: 05/24/12 Assistance Needed: +1 PT/OT Co-Evaluation/Treatment: Yes    Subjective Data  Subjective: I want to go home. I have plenty of family to help.   Cognition  Overall Cognitive Status: Appears within functional limits for tasks assessed/performed Arousal/Alertness: Awake/alert Orientation Level: Appears intact for tasks assessed Behavior During Session: [facility] for tasks performed    Balance     End of Session PT - End of Session Activity Tolerance: Patient limited by fatigue Patient left: in chair;with call bell/phone within reach;with family/visitor present Nurse Communication: Mobility status (HR to 144)   GP     [name] 05/24/2012, 4:58 PM

## 2012-05-25 DIAGNOSIS — M25519 Pain in unspecified shoulder: Secondary | ICD-10-CM

## 2012-05-25 DIAGNOSIS — J441 Chronic obstructive pulmonary disease with (acute) exacerbation: Secondary | ICD-10-CM

## 2012-05-25 DIAGNOSIS — I10 Essential (primary) hypertension: Secondary | ICD-10-CM

## 2012-05-25 DIAGNOSIS — F341 Dysthymic disorder: Secondary | ICD-10-CM

## 2012-05-25 MED ORDER — PREDNISONE 20 MG PO TABS
40.0000 mg | ORAL_TABLET | Freq: Every day | ORAL | Status: DC
  Administered 2012-05-25: 40 mg via ORAL
  Filled 2012-05-25 (×2): qty 2

## 2012-05-25 MED ORDER — PREDNISONE (PAK) 10 MG PO TABS: ORAL_TABLET | ORAL | Status: DC

## 2012-05-25 MED ORDER — BUDESONIDE 0.25 MG/2ML IN SUSP: 0.2500 mg | Freq: Two times a day (BID) | RESPIRATORY_TRACT | Status: DC

## 2012-05-25 MED ORDER — HYDROCOD POLST-CHLORPHEN POLST 10-8 MG/5ML PO LQCR: 5.0000 mL | Freq: Two times a day (BID) | ORAL | Status: DC | PRN

## 2012-05-25 MED ORDER — LEVOFLOXACIN 500 MG PO TABS: 500.0000 mg | ORAL_TABLET | Freq: Every day | ORAL | Status: DC

## 2012-05-25 NOTE — Progress Notes (Signed)
Patient discharge to home with home O2. No complaints of any pain or discomfort. PIV removed no s/s of swelling or infiltration. D/c instructions and follow up appointments done and given to the patient.

## 2012-05-25 NOTE — Discharge Summary (Signed)
Physician Discharge Summary  Patient ID: [name] MRN: [id number] DOB/AGE: [birth year] 78 y.o.  Admit date: 05/20/2012 Discharge date: 05/25/2012  Primary Care Physician:  [name], MD  Discharge Diagnoses:    . COPD exacerbation . Dyspnea . acute on Chronic respiratory failure with hypercapnia; currently  . Dyslipidemia . Anxiety  Consults:  None  Discharge Medications:   Medication List     As of 05/25/2012  1:18 PM    STOP taking these medications         beclomethasone 80 MCG/ACT inhaler   Commonly known as: QVAR      predniSONE 10 MG tablet   Commonly known as: DELTASONE      TAKE these medications         AEROCHAMBER MV inhaler   Use as instructed      albuterol (5 MG/ML) 0.5% nebulizer solution   Commonly known as: PROVENTIL   Take 0.5 mLs (2.5 mg total) by nebulization every 2 (two) hours as needed for wheezing or shortness of breath.      ALPRAZolam 0.5 MG tablet   Commonly known as: XANAX   Take 1 tablet (0.5 mg total) by mouth 3 (three) times daily as needed for anxiety or sleep.      budesonide 0.25 MG/2ML nebulizer solution   Commonly known as: PULMICORT   Take 2 mLs (0.25 mg total) by nebulization 2 (two) times daily.      chlorpheniramine-HYDROcodone 10-8 MG/5ML Lqcr   Commonly known as: TUSSIONEX   Take 5 mLs by mouth every 12 (twelve) hours as needed.      DSS 100 MG Caps   Take 100 mg by mouth 2 (two) times daily.      esomeprazole 40 MG capsule   Commonly known as: NEXIUM   Take 40 mg by mouth 2 (two) times daily.      furosemide 20 MG tablet   Commonly known as: LASIX   Take 20 mg by mouth 2 (two) times daily.      GARLIQUE 400 MG Tbec   Generic drug: Garlic   Take 1 tablet by mouth daily.      HYDROcodone-acetaminophen 5-500 MG per tablet   Commonly known as: VICODIN   Take 1 tablet by mouth every 6 (six) hours as needed. pain      ipratropium-albuterol 0.5-2.5 (3) MG/3ML Soln   Commonly known as: DUONEB   Take 3  mLs by nebulization 4 (four) times daily.      levofloxacin 500 MG tablet   Commonly known as: LEVAQUIN   Take 1 tablet (500 mg total) by mouth daily. X 5days      losartan 100 MG tablet   Commonly known as: COZAAR   Take 100 mg by mouth daily.      potassium chloride 10 MEQ CR capsule   Commonly known as: MICRO-K   Take 10 mEq by mouth daily as needed.      predniSONE 10 MG tablet   Commonly known as: STERAPRED UNI-PAK   Prednisone dosing: Take  Prednisone 40mg  (4 tabs) x 2 days, then taper to 30mg  (3 tabs) x 3 days, then 20mg  (2 tabs) x 3days, then 10mg  (1 tab) x 3days, then OFF.    Dispense:  26 tabs, refills: None      rosuvastatin 10 MG tablet   Commonly known as: CRESTOR   Take 10 mg by mouth daily.         Brief H and P: For complete  details please refer to admission H and P, but in brief This is a 78 year old female, with know history of COPD/Chronic respiratory failure, on home oxygen at 3L/Min, HTN, GERD, diverticulosis, OA, osteoporosis, scoliosis, dyslipidemia, AAA s/p repair, Psoriasis, adrenal mass, Glaucoma, s/p bilateral cataract surgery, s/p TAH, urolithiasis, s/p surgery, s/p appendectomy, s/p tonsillectomy, presenting with progressive SOB.wheeze. Patient is s/p hospitalization 04/27/12-04/30/12 for similar symptoms, and completed her steroid taper about 2 days ago. According to patient, since discharge, she was never quite back to baseline respiratory status. She had become progressively SOB and wheezy, worse in the last one week, with a dry cough, but no chest pain. Her effort tolerance had markedly decreased, so that she had been unable to participate in home physiotherapy. She was scheduled for a follow up appointment with her PMD on 05/02/12, but missed this appointment, so an RN from her PMD's office showed up at her home on 05/19/12, and arranged an appointment. On arrival at her PMD's office this AM, she was evaluated and referred to the ED. This is her 3rd ED  visit this year, for COPD exacerbation.    Hospital Course:  COPD exacerbation: With recurrent exacerbations and admissions, now improving progressively. Patient was placed on scheduled to Xopenex and Atrovent nebs, Pulmicort and IV steroids, levofloxacin and PPI. She had slow but progressive improvement. Patient preferred Pulmicort over QVAR. Physical therapy evaluation was done and she was initially recommended skilled nursing facility however patient was adamant on going home with home PT. Patient had significant anxiety during hospitalization hence Xanax was made scheduled tid. Pulmonary appointment was arranged prior to DC home with Tammy Parett, [facility]. Home PT and RN followup was arranged.  Acute on Chronic respiratory failure with hypercapnia: Currently at baseline, 3L O2 via [place]   Dyslipidemia: Continue Lipitor   GERD: cont PPI   Anxiety: Continue Xanax, much improved today. Patient's husband had recent diagnosis of new malignancy and patient had significant anxiety over that and wanted to stay close to her husband rather than skilled nursing facility.    Day of Discharge BP 149/68  Pulse 113  Temp 97.5 F (36.4 C) (Oral)  Resp 18  Ht 5' (1.524 m)  Wt 62.5 kg (137 lb 12.6 oz)  BMI 26.91 kg/m2  SpO2 99%  Physical Exam: General: Alert and awake oriented x3 not in any acute distress. HEENT: anicteric sclera, pupils reactive to light and accommodation CVS: S1-S2 clear no murmur rubs or gallops Chest: Fairly clear to auscultation bilaterally, no significant rhonchi or wheezing Abdomen: soft nontender, nondistended, normal bowel sounds, no organomegaly Extremities: no cyanosis, clubbing or edema noted bilaterally Neuro: Cranial nerves II-XII intact, no focal neurological deficits    The results of significant diagnostics from this hospitalization (including imaging, microbiology, ancillary and laboratory) are listed below for reference.    LAB RESULTS: Basic  Metabolic Panel:  Lab 05/24/12 0981 05/23/12 0530  NA 140 138  K 4.3 3.7  CL 100 98  CO2 30 30  GLUCOSE 137* 150*  BUN 32* 33*  CREATININE 0.87 0.90  CALCIUM 9.3 9.6  MG -- --  PHOS -- --   CBC:  Lab 05/24/12 0510 05/23/12 0530  WBC 8.8 9.6  NEUTROABS -- --  HGB 11.0* 10.9*  HCT 33.9* 34.6*  MCV 89.0 --  PLT 296 300    Significant Diagnostic Studies:  Dg Chest 2 View (if Patient Has Fever And/or Copd)  05/20/2012  *RADIOLOGY REPORT*  Clinical Data: Short of  breath.  COPD.  Emphysema.  CHEST - 2 VIEW  Comparison: 04/27/2012.  Findings: Emphysema is present. The cardiopericardial silhouette appears within normal limits.  Bilateral pleural apical scarring. No airspace disease.  No effusion.  Hyperinflation.  Stable appearance of the costophrenic angles and lung bases posteriorly on the lateral view.  Partially visualized abdominal aortic stent graft.  IMPRESSION: Emphysema without active cardiopulmonary disease.   Original Report Authenticated By: Andreas Newport, M.D.    Nm Pulmonary Perf And Vent  05/20/2012  *RADIOLOGY REPORT*  Clinical Data: Shortness of breath  NM PULMONARY VENTILATION AND PERFUSION SCAN  Radiopharmaceutical: CURIE MAA TECHNETIUM TO 58M ALBUMIN AGGREGATED 45 mCi technetium 99 DTPA  Comparison: Chest x-ray from today  Findings: The ventilation phase of the study is markedly suboptimal in this patient who could not inhale well.  Therefore only the perfusion study is interpretable.  The patient was given 6 mCi of technetium 71m MAA intravenously and images over the lungs were performed in various obliquities.  No perfusion abnormality is seen.  This scan is considered low probability for pulmonary embolism.  IMPRESSION: Very suboptimal ventilation.  However the perfusion is normal and the study is considered low probability for pulmonary embolism.   Original Report Authenticated By: Dwyane Dee, M.D.      Disposition and Follow-up:     Discharge Orders     Future Appointments: Provider: Department: Dept Phone: Center:   06/01/2012 2:00 PM [name], NP [facility] [phone] None   06/30/2012 2:30 PM Storm Frisk, MD [facility] [phone] None   03/01/2013 11:45 AM [name], MD [facility] [phone] [facility]     Future Orders Please Complete By Expires   Diet - low sodium heart healthy      Increase activity slowly      (HEART FAILURE PATIENTS) Call MD:  Anytime you have any of the following symptoms: 1) 3 pound weight gain in 24 hours or 5 pounds in 1 week 2) shortness of breath, with or without a dry hacking cough 3) swelling in the hands, feet or stomach 4) if you have to sleep on extra pillows at night in order to breathe.      Discharge instructions      Comments:   Please continue Nebulizer treatments 4 times/day       DISPOSITION: Home DIET: Heart healthy ACTIVITY: As tolerated  DISCHARGE FOLLOW-UP Follow-up Information    Follow up with [name], MD. Schedule an appointment as soon as possible for a visit in 10 days. (for follow-up)    Contact information:   [address] [place] [address] [place], [place] [address] [phone]       Follow up with [facility], NP. On 06/01/2012. (at 2:00 PM)    Contact information:   [facility] [address] [place] [address] [phone]          Time spent on Discharge: 39 mins  Signed:   Idelia Caudell M.D. [facility] 05/25/2012, 1:18 PM Pager: [phone]

## 2012-05-25 NOTE — Progress Notes (Signed)
Physical Therapy Treatment Patient Details Name: [name] MRN: [id number] DOB: July 26, 1930 Today's Date: 05/25/2012 Time: 1015-1047 PT Time Calculation (min): 32 min  PT Assessment / Plan / Recommendation Comments on Treatment Session  Pt progressing well with mobility and is very aware of oxygen consumption and energy conservation.      Follow Up Recommendations  SNF;Home health PT     Does the patient have the potential to tolerate intense rehabilitation     Barriers to Discharge        Equipment Recommendations  None recommended by PT    Recommendations for Other Services    Frequency Min 3X/week   Plan Discharge plan remains appropriate;Frequency remains appropriate    Precautions / Restrictions Precautions Precautions: Fall Precaution Comments: monitor O2 sats & HR Restrictions Weight Bearing Restrictions: No   Pertinent Vitals/Pain No pain    Mobility  Bed Mobility Bed Mobility: Not assessed Transfers Transfers: Sit to Stand;Stand to Sit Sit to Stand: 5: Supervision;With upper extremity assist;With armrests;From chair/3-in-1 Stand to Sit: 5: Supervision;With upper extremity assist;With armrests;To chair/3-in-1 Details for Transfer Assistance: Supervision for safety with cues for hand placement.  Ambulation/Gait Ambulation/Gait Assistance: 4: Min guard Ambulation Distance (Feet): 120 Feet Assistive device: Rolling walker Ambulation/Gait Assistance Details: Pt very steady with ambulation, however VERY slow gait speed.  Ambulated on 3L O2 with O2 sats in upper 90's throughout session.  HR up to 150, however quickly decreases to 120's.   Gait Pattern: Decreased stride length    Exercises     PT Diagnosis:    PT Problem List:   PT Treatment Interventions:     PT Goals Acute Rehab PT Goals PT Goal Formulation: With patient Time For Goal Achievement: 06/01/12 Potential to Achieve Goals: Good Pt will Ambulate: 51 - 150 feet;with modified  independence;with least restrictive assistive device PT Goal: Ambulate - Progress: Updated due to goal met  Visit Information  Last PT Received On: 05/25/12 Assistance Needed: +1    Subjective Data  Subjective: I really want to brush my teeth.  Patient Stated Goal: to walk farther   Cognition  Overall Cognitive Status: Appears within functional limits for tasks assessed/performed Arousal/Alertness: Awake/alert Orientation Level: Appears intact for tasks assessed Behavior During Session: [name] for tasks performed    Balance  Balance Balance Assessed: Yes Dynamic Standing Balance Dynamic Standing - Balance Support: Left upper extremity supported;No upper extremity supported;During functional activity Dynamic Standing - Level of Assistance: 5: Stand by assistance;4: Min assist Dynamic Standing - Balance Activities: Forward lean/weight shifting;Lateral lean/weight shifting Dynamic Standing - Comments: Pt stood at sink approx 3 mins in order to brush and clean dentures with intermittnet UE support.   End of Session PT - End of Session Equipment Utilized During Treatment: Oxygen Activity Tolerance: Patient limited by fatigue Patient left: in chair;with call bell/phone within reach;with family/visitor present Nurse Communication: Mobility status   GP     Page, Meribeth Mattes 05/25/2012, 11:07 AM

## 2012-05-26 ENCOUNTER — Other Ambulatory Visit: Payer: Self-pay | Admitting: Family Medicine

## 2012-05-27 ENCOUNTER — Ambulatory Visit (INDEPENDENT_AMBULATORY_CARE_PROVIDER_SITE_OTHER): Payer: Medicare HMO | Admitting: Family Medicine

## 2012-05-27 ENCOUNTER — Encounter: Payer: Self-pay | Admitting: Family Medicine

## 2012-05-27 VITALS — BP 120/80 | HR 113 | Temp 98.0°F | Wt 135.0 lb

## 2012-05-27 DIAGNOSIS — J441 Chronic obstructive pulmonary disease with (acute) exacerbation: Secondary | ICD-10-CM

## 2012-05-27 DIAGNOSIS — F419 Anxiety disorder, unspecified: Secondary | ICD-10-CM

## 2012-05-27 MED ORDER — HYDROCODONE-ACETAMINOPHEN 5-500 MG PO TABS: 1.0000 | ORAL_TABLET | Freq: Four times a day (QID) | ORAL | Status: DC | PRN

## 2012-05-27 NOTE — Progress Notes (Signed)
Subjective:    Patient ID: [name], female    DOB: 04-18-1931, 78 y.o.   MRN: [id number]  HPI  78 yo female with h/o COPD IV, O2 dependent (3L at baseline) here for hospital follow up.  She says she feels much better today.  Notes reviewed.  Admitted to [facility] 12/5-12/03/2012 for acute COPD exacerbation. This is her 3rd ED visit this year, for COPD exacerbation.   Treated with IV abx, steroids and nebs.  Patient preferred Pulmicort over QVAR and therefore was changed back to Pulmicort. Also sent home with additional 5 day course of levaquin and steroid taper- decreases to 30 mg daily tomorrow.   Dg Chest 2 View (if Patient Has Fever And/or Copd)  05/20/2012  *RADIOLOGY REPORT*  Clinical Data: Short of breath.  COPD.  Emphysema.  CHEST - 2 VIEW  Comparison: 04/27/2012.  Findings: Emphysema is present. The cardiopericardial silhouette appears within normal limits.  Bilateral pleural apical scarring. No airspace disease.  No effusion.  Hyperinflation.  Stable appearance of the costophrenic angles and lung bases posteriorly on the lateral view.  Partially visualized abdominal aortic stent graft.  IMPRESSION: Emphysema without active cardiopulmonary disease.   Original Report Authenticated By: Andreas Newport, M.D.    05/20/2012  *RADIOLOGY REPORT*  Clinical Data: Shortness of breath  NM PULMONARY VENTILATION AND PERFUSION SCAN  Radiopharmaceutical: CURIE MAA TECHNETIUM TO 27M ALBUMIN AGGREGATED 45 mCi technetium 99 DTPA  Comparison: Chest x-ray from today  Findings: The ventilation phase of the study is markedly suboptimal in this patient who could not inhale well.  Therefore only the perfusion study is interpretable.  The patient was given 6 mCi of technetium 92m MAA intravenously and images over the lungs were performed in various obliquities.  No perfusion abnormality is seen.  This scan is considered low probability for pulmonary embolism.  IMPRESSION: Very suboptimal ventilation.  However  the perfusion is normal and the study is considered low probability for pulmonary embolism.   Original Report Authenticated By: Dwyane Dee, M.D.     Physical therapy evaluation was done and she was initially recommended skilled nursing facility however patient was adamant on going home with home PT.  Has appt with Tammy Parett, [facility] for next week.   Home PT and RN have already been to her home.      Past Medical History  Diagnosis Date  . History of colonic polyps   . COPD (chronic obstructive pulmonary disease)   . Diverticulitis of colon   . GERD (gastroesophageal reflux disease)   . HTN (hypertension)   . Osteoarthritis   . Hyperlipemia   . Aortic aneurysm, abdominal   . Ovarian cyst, left   . Psoriasis   . Chronic respiratory failure     Uses home oxygen at 2 L  . ADRENAL MASS 05/04/2007  . GLAUCOMA 03/30/2007  . HEMORRHOIDS, INTERNAL 03/23/2008  . OSTEOPOROSIS 03/30/2007  . SCOLIOSIS 03/30/2007  . PONV (postoperative nausea and vomiting)       Review of Systems Neg for fever Pos fatigue     Objective:   Physical Exam BP 120/80  Pulse 113  Temp 98 F (36.7 C)  Wt 135 lb (61.236 kg)  SpO2 97%   Gen. Pleasant, well-nourished, in no distress, normal affect ENT - no lesions, no post nasal drip Neck: No JVD, no thyromegaly, no carotid bruits Lungs:, no significant rhonchi or wheezing  Cardiovascular: Rhythm regular, heart sounds  normal, no murmurs or gallops, no peripheral  edema Abdomen: soft and non-tender, no hepatosplenomegaly, BS normal. Musculoskeletal: No deformities, no cyanosis or clubbing Neuro:  alert, non focal Psych:  Moderately anxious (baseline).    Assessment & Plan:   1. COPD exacerbation  At baseline.  Finish course of levaquin, continue steroid taper.  Keep appt with pulm. Follow up in 1 month.   2. Anxiety  Stable- got good news about her husband's health today so her symptoms are well controlled today with xanax.

## 2012-05-27 NOTE — Patient Instructions (Addendum)
Have a wonderful Christmas. Please come see me in 1 month.

## 2012-06-01 ENCOUNTER — Encounter: Payer: Self-pay | Admitting: Adult Health

## 2012-06-01 ENCOUNTER — Ambulatory Visit (INDEPENDENT_AMBULATORY_CARE_PROVIDER_SITE_OTHER): Payer: Medicare HMO | Admitting: Adult Health

## 2012-06-01 VITALS — BP 138/78 | HR 101 | Temp 97.7°F | Ht 60.0 in | Wt 138.0 lb

## 2012-06-01 DIAGNOSIS — J441 Chronic obstructive pulmonary disease with (acute) exacerbation: Secondary | ICD-10-CM

## 2012-06-01 DIAGNOSIS — B029 Zoster without complications: Secondary | ICD-10-CM

## 2012-06-01 MED ORDER — LEVOFLOXACIN 500 MG PO TABS: 500.0000 mg | ORAL_TABLET | Freq: Every day | ORAL | Status: DC

## 2012-06-01 NOTE — Assessment & Plan Note (Signed)
Recurrent  Exacerbation -slowly resolving   Plan  Extend Levaquin 500mg  daily for 3 days.  Finish Prednisone taper over next week.  Continue Pulmicort Neb Twice daily   Continue on DuoNeb Four times a day      Follow up Dr. Delford Field  As planned in 3 weeks and As needed   Please contact office for sooner follow up if symptoms do not improve or worsen or seek emergency care  follow up with family doctor regarding your shingles and back pain.

## 2012-06-01 NOTE — Patient Instructions (Addendum)
Extend Levaquin 500mg  daily for 3 days.  Finish Prednisone taper over next week.  Continue Pulmicort Neb Twice daily   Continue on DuoNeb Four times a day   Valtrex 1 gram Three times a day  For 7 days  Follow up Dr. Delford Field  As planned in 3 weeks and As needed   Please contact office for sooner follow up if symptoms do not improve or worsen or seek emergency care  follow up with family doctor regarding your shingles and back pain.

## 2012-06-01 NOTE — Assessment & Plan Note (Signed)
Left buttock shingles outbreak   Plan  Begin Valtrex x 7 day  follow up with PCP in 1-2 weeks and As needed

## 2012-06-01 NOTE — Progress Notes (Signed)
  Subjective:    Patient ID: [name], female    DOB: [birth year], 78 y.o.   MRN: [id number]  HPI  78 y.o. WF Copd IV Golds. Oxygen dependent.  Adm 2/13 , 11/13 , and 05/20/12   06/01/2012 Post hospital follow up  Patient returns for a post hospital followup. Re-Admitted Dec 5-10 for an acute COPD, exacerbation. She was treated with IV antibiotics, steroids, and nebulized bronchodilators. Changed from QVAR to pulmicort neb.  She was discharged on a total of 7 days of Levaquin along with a prednisone taper.  Finished Levaquin yesterday . Feels she needs more abx. She Greb has congestion and cough.  Currently, she is on prednisone 20mg  daily  Since discharge she Lesperance feels weak. With  cough, shortness of breath.  Complains of back and buttock pain. Seen by PCP few days ago , rx vicodin w/out much help.    Past Medical History  Diagnosis Date  . History of colonic polyps   . COPD (chronic obstructive pulmonary disease)   . Diverticulitis of colon   . GERD (gastroesophageal reflux disease)   . HTN (hypertension)   . Osteoarthritis   . Hyperlipemia   . Aortic aneurysm, abdominal   . Ovarian cyst, left   . Psoriasis   . Chronic respiratory failure     Uses home oxygen at 2 L  . ADRENAL MASS 05/04/2007  . GLAUCOMA 03/30/2007  . HEMORRHOIDS, INTERNAL 03/23/2008  . OSTEOPOROSIS 03/30/2007  . SCOLIOSIS 03/30/2007  . PONV (postoperative nausea and vomiting)       Review of Systems   Constitutional:   No  weight loss, night sweats,  Fevers, chills,  +fatigue, or  lassitude.  HEENT:   No headaches,  Difficulty swallowing,  Tooth/dental problems, or  Sore throat,                No sneezing, itching, ear ache, nasal congestion, post nasal drip,   CV:  No chest pain,  Orthopnea, PND, swelling in lower extremities, anasarca, dizziness, palpitations, syncope.   GI  No heartburn, indigestion, abdominal pain, nausea, vomiting, diarrhea, change in bowel habits, loss of appetite,  bloody stools.   Resp:   No coughing up of blood.  [name]  No chest wall deformity  Skin: no rash or lesions.  GU: no dysuria, change in color of urine, no urgency or frequency.  No flank pain, no hematuria   MS:  No joint pain or swelling.  No decreased range of motion.  No back pain.  Psych:  No change in mood or affect. No depression or anxiety.  No memory loss.      Objective:   Physical Exam   Gen. Pleasant, elderly on o2 in no distress, normal affect ENT - no lesions, no post nasal drip Neck: No JVD, no thyromegaly, no carotid bruits Lungs: no use of accessory muscles, no dullness to percussion, CTA , diminished BS in bases  Cardiovascular: Rhythm regular, heart sounds  normal, no murmurs or gallops, no peripheral edema Abdomen: soft and non-tender, no hepatosplenomegaly, BS normal. Musculoskeletal: No deformities, no cyanosis or clubbing Neuro:  alert, non focal Skin : scattered vesicles along medial buttock         Assessment & Plan:

## 2012-06-03 ENCOUNTER — Telehealth: Payer: Self-pay

## 2012-06-03 DIAGNOSIS — I509 Heart failure, unspecified: Secondary | ICD-10-CM

## 2012-06-03 NOTE — Telephone Encounter (Signed)
Yes please give lasix 20 mg daily for next two days. Call us with an update.

## 2012-06-03 NOTE — Telephone Encounter (Signed)
Amy with [facility] left /vm;Amy saw pt today for first time; pt's vitals look good but heart rate is 92 and every 15 seconds heart rate will speed up and then slow back down, noticed in hospital but was not a concern; O2 sat 97 % on 3L; temp 98.8. Amy hears some crackles(sounds wet) in right lower lobe; not sure if Dr Dayton Martes would want to see pt or get CXR; previous nurse did not mention crackles. Pt is not coughing anything up; pt said she feels great. Pt does have Lasix 20 mg tabs if needed. Does Dr Dayton Martes want pt to try Lasix to clear up crackles or not. Amy request call back.

## 2012-06-03 NOTE — Telephone Encounter (Signed)
Left message advising home health nurse and asked that she call back to verify that she did get the message.

## 2012-06-04 NOTE — Telephone Encounter (Signed)
Advised patient.  She will come in on Tuesday for lab work.  Per Dr. Dayton Martes, advised 20 mg's through the weekend but then stop until lab results are back.  Pt understood.

## 2012-06-04 NOTE — Telephone Encounter (Signed)
Spoke with patient.  She did get the message from her nurse to increase her lasix for yesterday and today.  States she's feeling some better today.   Asks if she should continue higher dose over the weekend.  Please advise.

## 2012-06-04 NOTE — Telephone Encounter (Signed)
Yes I would continue for two more days- 20 mg daily for 2 days. Come in next week for BMET (order entered) and please keep [place] posted with symptoms.

## 2012-06-07 ENCOUNTER — Encounter: Payer: Self-pay | Admitting: Family Medicine

## 2012-06-07 ENCOUNTER — Ambulatory Visit (INDEPENDENT_AMBULATORY_CARE_PROVIDER_SITE_OTHER): Payer: Medicare HMO | Admitting: Family Medicine

## 2012-06-07 ENCOUNTER — Other Ambulatory Visit (INDEPENDENT_AMBULATORY_CARE_PROVIDER_SITE_OTHER): Payer: Medicare HMO

## 2012-06-07 ENCOUNTER — Other Ambulatory Visit: Payer: Medicare HMO

## 2012-06-07 ENCOUNTER — Other Ambulatory Visit: Payer: Self-pay | Admitting: *Deleted

## 2012-06-07 VITALS — BP 110/60 | HR 72 | Temp 100.1°F

## 2012-06-07 DIAGNOSIS — I509 Heart failure, unspecified: Secondary | ICD-10-CM

## 2012-06-07 DIAGNOSIS — B029 Zoster without complications: Secondary | ICD-10-CM

## 2012-06-07 LAB — BASIC METABOLIC PANEL
BUN: 29 mg/dL — ABNORMAL HIGH (ref 6–23)
CO2: 29 mEq/L (ref 19–32)
Calcium: 9.3 mg/dL (ref 8.4–10.5)
Creatinine, Ser: 0.9 mg/dL (ref 0.4–1.2)
GFR: 63.36 mL/min (ref >60.00)
Glucose, Bld: 120 mg/dL — ABNORMAL HIGH (ref 70–99)

## 2012-06-07 MED ORDER — VALACYCLOVIR HCL 1 G PO TABS: 1000.0000 mg | ORAL_TABLET | Freq: Three times a day (TID) | ORAL | Status: DC

## 2012-06-07 MED ORDER — OXYCODONE HCL 5 MG PO TABS: 5.0000 mg | ORAL_TABLET | Freq: Four times a day (QID) | ORAL | Status: DC | PRN

## 2012-06-07 NOTE — Assessment & Plan Note (Signed)
Given the pain, would start valtrex even though she is more than 72 hours into sx.  No clear source for the abnormal temperature.  She has clear lungs and no other obvious source.  Change to oxycodone for pain and hold hydrocodone for now.  D/w pt.  Sedation and GI caution.  She understood.  Will notify PCP as a FYI.

## 2012-06-07 NOTE — Patient Instructions (Addendum)
Take oxycodone instead of vicodin/hydrocodone for the pain.  It can make you drowsy and constipated.  Take a stool softener 1-2 times a day if needed.  Start the valtex today and notify us with an update later in the week.  Take care.

## 2012-06-07 NOTE — Telephone Encounter (Signed)
Opened in error

## 2012-06-07 NOTE — Progress Notes (Signed)
R lower back pain, burning. Also with R leg pain.  No sx on L side.  Pain is worse walking than sitting, worse sitting than laying down.  Rash prev noted on buttocks.  Started about 1 week ago, worse in the meantime.  Taking vicodin for OA at baseline, minimal help with pain.  No trauma.   Fell 2-3 weeks ago, on the way to the hospital, but that was not related to the pain today.  No CNAVD.  Temp 100.1 today.  She had some sweats yesterday.  No true weakness, but walking limited by pain.    She's had shingles prev on the L leg and this feels similar.   Meds, vitals, and allergies reviewed.   ROS: See HPI.  Otherwise, noncontributory.  nad but uncomfortable.  rrr ctab Rash on R buttock is linear, a small cluster of red lesions w/o fluctuant mass.  In dermatomal distribution.  She has change in sensation in dermatomal distribution on the R leg, hypersensitive band down the R leg.

## 2012-06-08 ENCOUNTER — Other Ambulatory Visit: Payer: Medicare HMO

## 2012-06-22 ENCOUNTER — Telehealth: Payer: Self-pay | Admitting: Critical Care Medicine

## 2012-06-22 MED ORDER — BUDESONIDE 0.25 MG/2ML IN SUSP: 0.2500 mg | Freq: Two times a day (BID) | RESPIRATORY_TRACT | Status: DC

## 2012-06-22 NOTE — Telephone Encounter (Signed)
I spoke with pt and she stated she is having a hard time getting her budesonide neb refilled. [facility] to see [facility] is going [facility]. I was advised pt have [employer] HMO and her medicare part D does not cover this d/t her HMO plan. I was advised she has to call her insurance bc she may have to get this through a mail order or DME. I called and made pt aware of this. She stated she will called her insurance and find out what needs to be done. If we need to do anything she will call us and let us know. She has RX on hand. Will sign off message

## 2012-06-22 NOTE — Telephone Encounter (Signed)
i spoke with pt. She stated just needs [facility] w/ DX code on it. i have done so. Nothing further was needed

## 2012-06-23 ENCOUNTER — Telehealth: Payer: Self-pay | Admitting: Critical Care Medicine

## 2012-06-23 NOTE — Telephone Encounter (Signed)
Pt is aware that her medication was sent in yesterday. She was inquiring about an appointment to be seen because she is feeling bad and having lots of SOB and coughing. I have scheduled her to see TP on 06/25/11 @ 11:45am.

## 2012-06-24 ENCOUNTER — Ambulatory Visit (INDEPENDENT_AMBULATORY_CARE_PROVIDER_SITE_OTHER): Payer: Medicare Other | Admitting: Family Medicine

## 2012-06-24 ENCOUNTER — Encounter: Payer: Self-pay | Admitting: Family Medicine

## 2012-06-24 ENCOUNTER — Ambulatory Visit: Payer: Medicare HMO | Admitting: Adult Health

## 2012-06-24 ENCOUNTER — Telehealth: Payer: Self-pay | Admitting: Critical Care Medicine

## 2012-06-24 VITALS — BP 160/80 | HR 96 | Temp 98.1°F | Wt 137.0 lb

## 2012-06-24 DIAGNOSIS — J441 Chronic obstructive pulmonary disease with (acute) exacerbation: Secondary | ICD-10-CM

## 2012-06-24 DIAGNOSIS — F411 Generalized anxiety disorder: Secondary | ICD-10-CM

## 2012-06-24 MED ORDER — LEVOFLOXACIN 500 MG PO TABS: 500.0000 mg | ORAL_TABLET | Freq: Every day | ORAL | Status: DC

## 2012-06-24 MED ORDER — HYDROCODONE-ACETAMINOPHEN 5-500 MG PO TABS: 1.0000 | ORAL_TABLET | Freq: Four times a day (QID) | ORAL | Status: DC | PRN

## 2012-06-24 MED ORDER — BUDESONIDE 0.25 MG/2ML IN SUSP: 0.2500 mg | Freq: Two times a day (BID) | RESPIRATORY_TRACT | Status: DC

## 2012-06-24 MED ORDER — FUROSEMIDE 20 MG PO TABS: 20.0000 mg | ORAL_TABLET | Freq: Two times a day (BID) | ORAL | Status: DC

## 2012-06-24 MED ORDER — POTASSIUM CHLORIDE ER 10 MEQ PO CPCR: 10.0000 meq | ORAL_CAPSULE | Freq: Every day | ORAL | Status: DC | PRN

## 2012-06-24 NOTE — Progress Notes (Signed)
79  yo with h/o COPD well known to me here with inc in cough and change/inc in sputum over past 2 days.  Under significant stress- husband's cancer has spread beyond his bladder.  She recently had the shingles on her right leg and hip.     No fevers.  She feels she is wheezing a little more.     Some relief of cough with SABA.     Patient Active Problem List  Diagnosis  . SHINGLES  . ADRENAL MASS  . HYPERLIPIDEMIA  . GLAUCOMA  . CERUMEN IMPACTION, LEFT  . HYPERTENSION  . ABDOMINAL AORTIC ANEURYSM  . HEMORRHOIDS, INTERNAL  . GASTRITIS  . DUODENITIS, WITHOUT HEMORRHAGE  . HIATAL HERNIA  . PSORIASIS  . SEBORRHEIC KERATOSIS  . OSTEOARTHRITIS  . HIP PAIN, RIGHT  . BACK PAIN, LUMBAR  . OSTEOPOROSIS  . SCOLIOSIS  . FATIGUE  . RIB PAIN, LEFT SIDED  . DYSPHAGIA UNSPECIFIED  . DYSPHAGIA  . CHANGE IN BOWELS  . GROIN PAIN  . Laceration of lower leg  . COPD (chronic obstructive pulmonary disease)  . GERD (gastroesophageal reflux disease)  . HTN (hypertension)  . Hyperlipemia  . Chronic respiratory failure  . Epistaxis  . Right shoulder pain  . COPD exacerbation  . Dyspnea  . Chronic respiratory failure with hypercapnia  . Dyslipidemia  . Anxiety   Past Medical History  Diagnosis Date  . History of colonic polyps   . COPD (chronic obstructive pulmonary disease)   . Diverticulitis of colon   . GERD (gastroesophageal reflux disease)   . HTN (hypertension)   . Osteoarthritis   . Hyperlipemia   . Aortic aneurysm, abdominal   . Ovarian cyst, left   . Psoriasis   . Chronic respiratory failure     Uses home oxygen at 2 L  . ADRENAL MASS 05/04/2007  . GLAUCOMA 03/30/2007  . HEMORRHOIDS, INTERNAL 03/23/2008  . OSTEOPOROSIS 03/30/2007  . SCOLIOSIS 03/30/2007  . PONV (postoperative nausea and vomiting)    Past Surgical History  Procedure Date  . Kidney stone surgery   . Appendectomy   . Tonsillectomy   . Hemiarthroplasty shoulder fracture   . Cataract extraction,  bilateral   . Abdominal aortic aneurysm repair   . Abdominal hysterectomy    History  Substance Use Topics  . Smoking status: Former Smoker -- 0.5 packs/day for 40 years    Types: Cigarettes    Quit date: 06/16/2009  . Smokeless tobacco: Never Used  . Alcohol Use: No   Family History  Problem Relation Age of Onset  . Heart attack Mother   . Heart attack Father   . Deep vein thrombosis Father   . Cancer Brother     Pancreas and colon   Allergies  Allergen Reactions  . Clarithromycin     REACTION: abd pain  . Hydrocodone-Guaifenesin     REACTION: feels funny  . Iohexol      Desc: pt reports dyspnea and throat swelling about age 64 w/ IV contrast reaction when kidneys were being checked; 07/25/08 [facility]   . Moxifloxacin     REACTION: hallucinations  . Prednisone   . Sulfonamide Derivatives     REACTION: unknown   Current Outpatient Prescriptions on File Prior to Visit  Medication Sig Dispense Refill  . albuterol (PROVENTIL) (5 MG/ML) 0.5% nebulizer solution Take 0.5 mLs (2.5 mg total) by nebulization every 2 (two) hours as needed for wheezing or shortness of breath.  20 mL  0  .  ALPRAZolam (XANAX) 0.5 MG tablet Take 1 tablet (0.5 mg total) by mouth 3 (three) times daily as needed for anxiety or sleep.  60 tablet  0  . budesonide (PULMICORT) 0.25 MG/2ML nebulizer solution Take 2 mLs (0.25 mg total) by nebulization 2 (two) times daily.  120 mL  1  . chlorpheniramine-HYDROcodone (TUSSIONEX) 10-8 MG/5ML LQCR Take 5 mLs by mouth every 12 (twelve) hours as needed.  140 mL  0  . Docusate Sodium (DSS) 100 MG CAPS Take 100 mg by mouth 2 (two) times daily.      [name] esomeprazole (NEXIUM) 40 MG capsule Take 40 mg by mouth 2 (two) times daily.       . Garlic (GARLIQUE) 400 MG TBEC Take 1 tablet by mouth daily.       [name] ipratropium-albuterol (DUONEB) 0.5-2.5 (3) MG/3ML SOLN Take 3 mLs by nebulization 4 (four) times daily.      [name] losartan (COZAAR) 100 MG tablet Take 100 mg by mouth daily.      [name]  NEXIUM 40 MG capsule TAKE 1 CAPSULE BY MOUTH DAILY  30 capsule  5  . oxyCODONE (ROXICODONE) 5 MG immediate release tablet Take 1-2 tablets (5-10 mg total) by mouth every 6 (six) hours as needed for pain (sedation and constipation caution).  40 tablet  0  . potassium chloride (MICRO-K) 10 MEQ CR capsule Take 1 capsule (10 mEq total) by mouth daily as needed.  30 capsule  0  . rosuvastatin (CRESTOR) 10 MG tablet Take 10 mg by mouth daily.      [name] Spacer/Aero-Holding Chambers (AEROCHAMBER MV) inhaler Use as instructed  1 each  0   The PMH, PSH, Social History, Family History, Medications, and allergies have been reviewed in [facility], and have been updated if relevant.   Review of Systems       See HPI General:  Complains of chills; denies fever. CV:  Denies chest pain or discomfort. Resp:  Complains of cough, shortness of breath, sputum productive, and wheezing.  Physical Exam BP 160/80  Pulse 96  Temp 98.1 F (36.7 C)  Wt 137 lb (62.143 kg)  SpO2 91%  General:  GEN: nad, alert and oriented HEENT: mucous membranes moist, TM wnl, no OP exudates NECK: supple w/o LA CV: rrr.   PULM:  no inc wob and lungs clear today- no wheezes, crackles or rhonchi ABD: soft, +bs EXT: no edema SKIN: no acute rash   Assessment and Plan:  1. COPD exacerbation    Lung very clear and symptoms just started.  I recommended continue use of her inhalers. I did print out rx for levaquin but urged her not to fill it unless symptoms progress.  The patient indicates understanding of these issues and agrees with the plan.

## 2012-06-24 NOTE — Patient Instructions (Addendum)
Good to see you.  I am so sorry to hear about [name]. Hang in there. I am giving you a written prescription for an antibiotic (Levaquin)- please do not take this to the pharmacy unless your condition worsens or does not improve in the next 5 to 7 days.

## 2012-06-24 NOTE — Telephone Encounter (Signed)
Spoke with pharmacist She states that the budesonide needs to be refaxed with dx code on the rx I have faxed new rx with dx included Pharmacist states nothing further needed The pt is aware

## 2012-06-28 ENCOUNTER — Other Ambulatory Visit: Payer: Self-pay | Admitting: *Deleted

## 2012-06-28 MED ORDER — ALPRAZOLAM 0.5 MG PO TABS: 0.5000 mg | ORAL_TABLET | Freq: Three times a day (TID) | ORAL | Status: DC | PRN

## 2012-06-28 NOTE — Telephone Encounter (Signed)
Patient calling asking for refill for xanax because her husband is in the hospital and per pt " she needs them bad". [facility]12/2012, pt's insurance will only pay for #30 at a time and per pharmacy she can get refill on 07/13/2012. Original rx was called in 04/09/2012 for #90 but her insurance will only give her #30 at a time, the instructions are 1 at bedtime as needed. Please advise

## 2012-06-28 NOTE — Telephone Encounter (Signed)
rx called into pharmacy Spoke with patient and advised results   

## 2012-06-28 NOTE — Telephone Encounter (Signed)
Ok to refill with rx stating she can take three times daily prn anxiety or sleep.

## 2012-06-30 ENCOUNTER — Ambulatory Visit (INDEPENDENT_AMBULATORY_CARE_PROVIDER_SITE_OTHER): Payer: Medicare Other | Admitting: Critical Care Medicine

## 2012-06-30 ENCOUNTER — Encounter: Payer: Self-pay | Admitting: Critical Care Medicine

## 2012-06-30 VITALS — BP 146/90 | HR 92 | Temp 98.0°F | Ht 60.0 in | Wt 130.0 lb

## 2012-06-30 DIAGNOSIS — J441 Chronic obstructive pulmonary disease with (acute) exacerbation: Secondary | ICD-10-CM

## 2012-06-30 MED ORDER — BUDESONIDE 0.25 MG/2ML IN SUSP: 0.2500 mg | Freq: Every day | RESPIRATORY_TRACT | Status: DC

## 2012-06-30 MED ORDER — IPRATROPIUM BROMIDE 0.02 % IN SOLN: 500.0000 ug | Freq: Four times a day (QID) | RESPIRATORY_TRACT | Status: DC

## 2012-06-30 MED ORDER — HYDROCOD POLST-CHLORPHEN POLST 10-8 MG/5ML PO LQCR: 5.0000 mL | Freq: Two times a day (BID) | ORAL | Status: DC | PRN

## 2012-06-30 MED ORDER — PREDNISONE 10 MG PO TABS: ORAL_TABLET | ORAL | Status: DC

## 2012-06-30 MED ORDER — ALBUTEROL SULFATE (2.5 MG/3ML) 0.083% IN NEBU: 2.5000 mg | INHALATION_SOLUTION | Freq: Four times a day (QID) | RESPIRATORY_TRACT | Status: DC | PRN

## 2012-06-30 MED ORDER — METHYLPREDNISOLONE ACETATE 80 MG/ML IJ SUSP
120.0000 mg | Freq: Once | INTRAMUSCULAR | Status: AC
  Administered 2012-06-30: 120 mg via INTRAMUSCULAR

## 2012-06-30 NOTE — Patient Instructions (Addendum)
Prednisone 10mg  Take 4 for two days three for two days two for two days one for two days Use pulmicort daily 0.25mg   Use albuterol and atrovent 4 times daily in nebulizer A depomedrol 120mg  IM injection was given Take levaquin daily for 7days Return 2 months

## 2012-06-30 NOTE — Progress Notes (Signed)
Subjective:    Patient ID: [name], female    DOB: 11-19-1930, 79 y.o.   MRN: [id number]  HPI  79 y.o. . WF Copd IV Golds. Oxygen dependent.  Adm 2/13 , 11/13 , and 05/20/12   06/30/2012 Pt is worse over time.  If does any activity will get severly dyspneic. Pulmicort was helping but insurance will not cover  Past Medical History  Diagnosis Date  . History of colonic polyps   . COPD (chronic obstructive pulmonary disease)   . Diverticulitis of colon   . GERD (gastroesophageal reflux disease)   . HTN (hypertension)   . Osteoarthritis   . Hyperlipemia   . Aortic aneurysm, abdominal   . Ovarian cyst, left   . Psoriasis   . Chronic respiratory failure     Uses home oxygen at 2 L  . ADRENAL MASS 05/04/2007  . GLAUCOMA 03/30/2007  . HEMORRHOIDS, INTERNAL 03/23/2008  . OSTEOPOROSIS 03/30/2007  . SCOLIOSIS 03/30/2007  . PONV (postoperative nausea and vomiting)       Review of Systems   Constitutional:   No  weight loss, night sweats,  Fevers, chills,  +fatigue, or  lassitude.  HEENT:   No headaches,  Difficulty swallowing,  Tooth/dental problems, or  Sore throat,                No sneezing, itching, ear ache, nasal congestion, post nasal drip,   CV:  No chest pain,  Orthopnea, PND, swelling in lower extremities, anasarca, dizziness, palpitations, syncope.   GI  No heartburn, indigestion, abdominal pain, nausea, vomiting, diarrhea, change in bowel habits, loss of appetite, bloody stools.   Resp:   No coughing up of blood.  [name]  No chest wall deformity  Skin: no rash or lesions.  GU: no dysuria, change in color of urine, no urgency or frequency.  No flank pain, no hematuria   MS:  No joint pain or swelling.  No decreased range of motion.  No back pain.  Psych:  No change in mood or affect. No depression or anxiety.  No memory loss.      Objective:   Physical Exam  BP 146/90  Pulse 92  Temp 98 F (36.7 C) (Oral)  Ht 5' (1.524 m)  Wt 130 lb (58.968 kg)  BMI  25.39 kg/m2  SpO2 97%  Gen. Pleasant, elderly on o2 in no distress, normal affect ENT - no lesions, no post nasal drip Neck: No JVD, no thyromegaly, no carotid bruits Lungs: no use of accessory muscles, no dullness to percussion, CTA , diminished BS in bases , exp wheezes Cardiovascular: Rhythm regular, heart sounds  normal, no murmurs or gallops, no peripheral edema Abdomen: soft and non-tender, no hepatosplenomegaly, BS normal. Musculoskeletal: No deformities, no cyanosis or clubbing Neuro:  alert, non focal Skin : scattered vesicles along medial buttock         Assessment & Plan:   COPD exacerbation Gold stage D. COPD with frequent exacerbations now with recurrent acute exacerbation Plan Prednisone 10mg  Take 4 for two days three for two days two for two days one for two days Use pulmicort daily 0.25mg   Use albuterol and atrovent 4 times daily in nebulizer A depomedrol 120mg  IM injection was given Take levaquin daily for 7days Return 2 months    Updated Medication List Outpatient Encounter Prescriptions as of 06/30/2012  Medication Sig Dispense Refill  . albuterol (PROVENTIL) (5 MG/ML) 0.5% nebulizer solution Take 0.5 mLs (2.5 mg total) by nebulization every  2 (two) hours as needed for wheezing or shortness of breath.  20 mL  0  . ALPRAZolam (XANAX) 0.5 MG tablet Take 1 tablet (0.5 mg total) by mouth 3 (three) times daily as needed for anxiety or sleep.  90 tablet  0  . chlorpheniramine-HYDROcodone (TUSSIONEX) 10-8 MG/5ML LQCR Take 5 mLs by mouth every 12 (twelve) hours as needed.  180 mL  0  . Docusate Sodium (DSS) 100 MG CAPS Take 100 mg by mouth 2 (two) times daily.      [name] esomeprazole (NEXIUM) 40 MG capsule Take 40 mg by mouth 2 (two) times daily.       . furosemide (LASIX) 20 MG tablet Take 1 tablet (20 mg total) by mouth 2 (two) times daily.  30 tablet  0  . Garlic (GARLIQUE) 400 MG TBEC Take 1 tablet by mouth daily.       [name] HYDROcodone-acetaminophen (VICODIN) 5-500 MG  per tablet Take 1 tablet by mouth every 6 (six) hours as needed. pain  30 tablet  0  . losartan (COZAAR) 100 MG tablet Take 100 mg by mouth daily.      [name] NEXIUM 40 MG capsule TAKE 1 CAPSULE BY MOUTH DAILY  30 capsule  5  . oxyCODONE (ROXICODONE) 5 MG immediate release tablet Take 1-2 tablets (5-10 mg total) by mouth every 6 (six) hours as needed for pain (sedation and constipation caution).  40 tablet  0  . potassium chloride (MICRO-K) 10 MEQ CR capsule Take 1 capsule (10 mEq total) by mouth daily as needed.  30 capsule  0  . rosuvastatin (CRESTOR) 10 MG tablet Take 10 mg by mouth daily.      [name] Spacer/Aero-Holding Chambers (AEROCHAMBER MV) inhaler Use as instructed  1 each  0  . [DISCONTINUED] budesonide (PULMICORT) 0.25 MG/2ML nebulizer solution Take 2 mLs (0.25 mg total) by nebulization 2 (two) times daily.  120 mL  1  . [DISCONTINUED] chlorpheniramine-HYDROcodone (TUSSIONEX) 10-8 MG/5ML LQCR Take 5 mLs by mouth every 12 (twelve) hours as needed.  140 mL  0  . [DISCONTINUED] ipratropium-albuterol (DUONEB) 0.5-2.5 (3) MG/3ML SOLN Take 3 mLs by nebulization 4 (four) times daily.      [name] albuterol (PROVENTIL) (2.5 MG/3ML) 0.083% nebulizer solution Take 3 mLs (2.5 mg total) by nebulization every 6 (six) hours as needed for wheezing.  120 mL  12  . budesonide (PULMICORT) 0.25 MG/2ML nebulizer solution Take 2 mLs (0.25 mg total) by nebulization daily.  60 mL  12  . ipratropium (ATROVENT) 0.02 % nebulizer solution Take 2.5 mLs (500 mcg total) by nebulization 4 (four) times daily.  120 mL  12  . levofloxacin (LEVAQUIN) 500 MG tablet Take 1 tablet (500 mg total) by mouth daily.  7 tablet  0  . predniSONE (DELTASONE) 10 MG tablet Take 4 for two days three for two days two for two days one for two days  20 tablet  0  . [EXPIRED] methylPREDNISolone acetate (DEPO-MEDROL) injection 120 mg

## 2012-07-01 NOTE — Assessment & Plan Note (Signed)
Gold stage D. COPD with frequent exacerbations now with recurrent acute exacerbation Plan Prednisone 10mg  Take 4 for two days three for two days two for two days one for two days Use pulmicort daily 0.25mg   Use albuterol and atrovent 4 times daily in nebulizer A depomedrol 120mg  IM injection was given Take levaquin daily for 7days Return 2 months

## 2012-07-02 ENCOUNTER — Inpatient Hospital Stay (HOSPITAL_COMMUNITY)
Admission: EM | Admit: 2012-07-02 | Discharge: 2012-07-06 | DRG: 190 | Disposition: A | Discharge status: Skilled Nursing Facility | Payer: Medicare Other | Attending: Internal Medicine | Admitting: Internal Medicine

## 2012-07-02 ENCOUNTER — Encounter (HOSPITAL_COMMUNITY): Payer: Self-pay | Admitting: Emergency Medicine

## 2012-07-02 ENCOUNTER — Emergency Department (HOSPITAL_COMMUNITY): Payer: Medicare Other

## 2012-07-02 DIAGNOSIS — R5381 Other malaise: Secondary | ICD-10-CM

## 2012-07-02 DIAGNOSIS — Z881 Allergy status to other antibiotic agents status: Secondary | ICD-10-CM

## 2012-07-02 DIAGNOSIS — Z8719 Personal history of other diseases of the digestive system: Secondary | ICD-10-CM

## 2012-07-02 DIAGNOSIS — M4120 Other idiopathic scoliosis, site unspecified: Secondary | ICD-10-CM

## 2012-07-02 DIAGNOSIS — Z9089 Acquired absence of other organs: Secondary | ICD-10-CM

## 2012-07-02 DIAGNOSIS — K219 Gastro-esophageal reflux disease without esophagitis: Secondary | ICD-10-CM

## 2012-07-02 DIAGNOSIS — M25511 Pain in right shoulder: Secondary | ICD-10-CM

## 2012-07-02 DIAGNOSIS — Z860100 Personal history of colon polyps, unspecified: Secondary | ICD-10-CM

## 2012-07-02 DIAGNOSIS — L821 Other seborrheic keratosis: Secondary | ICD-10-CM

## 2012-07-02 DIAGNOSIS — K2990 Gastroduodenitis, unspecified, without bleeding: Secondary | ICD-10-CM

## 2012-07-02 DIAGNOSIS — J9612 Chronic respiratory failure with hypercapnia: Secondary | ICD-10-CM

## 2012-07-02 DIAGNOSIS — L408 Other psoriasis: Secondary | ICD-10-CM

## 2012-07-02 DIAGNOSIS — M129 Arthropathy, unspecified: Secondary | ICD-10-CM | POA: Diagnosis present

## 2012-07-02 DIAGNOSIS — J441 Chronic obstructive pulmonary disease with (acute) exacerbation: Principal | ICD-10-CM

## 2012-07-02 DIAGNOSIS — R1310 Dysphagia, unspecified: Secondary | ICD-10-CM

## 2012-07-02 DIAGNOSIS — H409 Unspecified glaucoma: Secondary | ICD-10-CM

## 2012-07-02 DIAGNOSIS — J9610 Chronic respiratory failure, unspecified whether with hypoxia or hypercapnia: Secondary | ICD-10-CM

## 2012-07-02 DIAGNOSIS — Z66 Do not resuscitate: Secondary | ICD-10-CM | POA: Diagnosis present

## 2012-07-02 DIAGNOSIS — R109 Unspecified abdominal pain: Secondary | ICD-10-CM

## 2012-07-02 DIAGNOSIS — E8779 Other fluid overload: Secondary | ICD-10-CM | POA: Diagnosis present

## 2012-07-02 DIAGNOSIS — Z90710 Acquired absence of both cervix and uterus: Secondary | ICD-10-CM

## 2012-07-02 DIAGNOSIS — Z87891 Personal history of nicotine dependence: Secondary | ICD-10-CM

## 2012-07-02 DIAGNOSIS — M25559 Pain in unspecified hip: Secondary | ICD-10-CM

## 2012-07-02 DIAGNOSIS — M1990 Unspecified osteoarthritis, unspecified site: Secondary | ICD-10-CM

## 2012-07-02 DIAGNOSIS — F411 Generalized anxiety disorder: Secondary | ICD-10-CM | POA: Diagnosis present

## 2012-07-02 DIAGNOSIS — H6120 Impacted cerumen, unspecified ear: Secondary | ICD-10-CM

## 2012-07-02 DIAGNOSIS — M545 Low back pain, unspecified: Secondary | ICD-10-CM

## 2012-07-02 DIAGNOSIS — I714 Abdominal aortic aneurysm, without rupture, unspecified: Secondary | ICD-10-CM

## 2012-07-02 DIAGNOSIS — E278 Other specified disorders of adrenal gland: Secondary | ICD-10-CM

## 2012-07-02 DIAGNOSIS — E8809 Other disorders of plasma-protein metabolism, not elsewhere classified: Secondary | ICD-10-CM

## 2012-07-02 DIAGNOSIS — K449 Diaphragmatic hernia without obstruction or gangrene: Secondary | ICD-10-CM

## 2012-07-02 DIAGNOSIS — R079 Chest pain, unspecified: Secondary | ICD-10-CM

## 2012-07-02 DIAGNOSIS — K648 Other hemorrhoids: Secondary | ICD-10-CM

## 2012-07-02 DIAGNOSIS — J9620 Acute and chronic respiratory failure, unspecified whether with hypoxia or hypercapnia: Secondary | ICD-10-CM | POA: Diagnosis present

## 2012-07-02 DIAGNOSIS — Z8249 Family history of ischemic heart disease and other diseases of the circulatory system: Secondary | ICD-10-CM

## 2012-07-02 DIAGNOSIS — Z79899 Other long term (current) drug therapy: Secondary | ICD-10-CM

## 2012-07-02 DIAGNOSIS — R0600 Dyspnea, unspecified: Secondary | ICD-10-CM

## 2012-07-02 DIAGNOSIS — E876 Hypokalemia: Secondary | ICD-10-CM

## 2012-07-02 DIAGNOSIS — S81819A Laceration without foreign body, unspecified lower leg, initial encounter: Secondary | ICD-10-CM

## 2012-07-02 DIAGNOSIS — Z888 Allergy status to other drugs, medicaments and biological substances status: Secondary | ICD-10-CM

## 2012-07-02 DIAGNOSIS — Z882 Allergy status to sulfonamides status: Secondary | ICD-10-CM

## 2012-07-02 DIAGNOSIS — I10 Essential (primary) hypertension: Secondary | ICD-10-CM

## 2012-07-02 DIAGNOSIS — Z9981 Dependence on supplemental oxygen: Secondary | ICD-10-CM

## 2012-07-02 DIAGNOSIS — E785 Hyperlipidemia, unspecified: Secondary | ICD-10-CM

## 2012-07-02 DIAGNOSIS — K2980 Duodenitis without bleeding: Secondary | ICD-10-CM

## 2012-07-02 DIAGNOSIS — R198 Other specified symptoms and signs involving the digestive system and abdomen: Secondary | ICD-10-CM

## 2012-07-02 DIAGNOSIS — R5383 Other fatigue: Secondary | ICD-10-CM

## 2012-07-02 DIAGNOSIS — F419 Anxiety disorder, unspecified: Secondary | ICD-10-CM

## 2012-07-02 DIAGNOSIS — J449 Chronic obstructive pulmonary disease, unspecified: Secondary | ICD-10-CM

## 2012-07-02 DIAGNOSIS — B029 Zoster without complications: Secondary | ICD-10-CM

## 2012-07-02 DIAGNOSIS — IMO0002 Reserved for concepts with insufficient information to code with codable children: Secondary | ICD-10-CM

## 2012-07-02 DIAGNOSIS — R1319 Other dysphagia: Secondary | ICD-10-CM

## 2012-07-02 DIAGNOSIS — D649 Anemia, unspecified: Secondary | ICD-10-CM

## 2012-07-02 DIAGNOSIS — M810 Age-related osteoporosis without current pathological fracture: Secondary | ICD-10-CM

## 2012-07-02 LAB — CBC
HCT: 29.2 % — ABNORMAL LOW (ref 36.0–46.0)
Hemoglobin: 9.1 g/dL — ABNORMAL LOW (ref 12.0–15.0)
MCH: 28.2 pg (ref 26.0–34.0)
MCV: 90.4 fL (ref 78.0–100.0)
RBC: 3.23 MIL/uL — ABNORMAL LOW (ref 3.87–5.11)

## 2012-07-02 LAB — BASIC METABOLIC PANEL
CO2: 17 mEq/L — ABNORMAL LOW (ref 19–32)
Chloride: 117 mEq/L — ABNORMAL HIGH (ref 96–112)
GFR calc non Af Amer: >90 mL/min (ref >90)
Glucose, Bld: 83 mg/dL (ref 70–99)
Potassium: 2 mEq/L — CL (ref 3.5–5.1)
Sodium: 144 mEq/L (ref 135–145)

## 2012-07-02 LAB — HEPATIC FUNCTION PANEL
ALT: 13 U/L (ref 0–35)
AST: 17 U/L (ref 0–37)
Alkaline Phosphatase: 44 U/L (ref 39–117)
Bilirubin, Direct: <0.1 mg/dL (ref 0.0–0.3)

## 2012-07-02 LAB — TROPONIN I: Troponin I: <0.3 ng/mL (ref <0.30)

## 2012-07-02 MED ORDER — IPRATROPIUM BROMIDE 0.02 % IN SOLN
0.5000 mg | RESPIRATORY_TRACT | Status: DC
  Administered 2012-07-02: 0.5 mg via RESPIRATORY_TRACT
  Filled 2012-07-02 (×3): qty 2.5

## 2012-07-02 MED ORDER — METHYLPREDNISOLONE SODIUM SUCC 125 MG IJ SOLR
125.0000 mg | Freq: Once | INTRAMUSCULAR | Status: AC
  Administered 2012-07-02: 125 mg via INTRAVENOUS
  Filled 2012-07-02: qty 2

## 2012-07-02 MED ORDER — ALBUTEROL SULFATE (5 MG/ML) 0.5% IN NEBU
2.5000 mg | INHALATION_SOLUTION | RESPIRATORY_TRACT | Status: DC
  Administered 2012-07-02 – 2012-07-03 (×2): 2.5 mg via RESPIRATORY_TRACT
  Filled 2012-07-02 (×2): qty 0.5

## 2012-07-02 MED ORDER — POTASSIUM CHLORIDE CRYS ER 20 MEQ PO TBCR
40.0000 meq | EXTENDED_RELEASE_TABLET | Freq: Once | ORAL | Status: AC
  Administered 2012-07-02: 40 meq via ORAL
  Filled 2012-07-02: qty 2

## 2012-07-02 MED ORDER — LORAZEPAM 1 MG PO TABS
1.0000 mg | ORAL_TABLET | Freq: Once | ORAL | Status: AC
  Administered 2012-07-02: 1 mg via ORAL
  Filled 2012-07-02: qty 1

## 2012-07-02 MED ORDER — SODIUM CHLORIDE 0.9 % IV SOLN
1.0000 g | Freq: Once | INTRAVENOUS | Status: AC
  Administered 2012-07-02: 1 g via INTRAVENOUS
  Filled 2012-07-02: qty 10

## 2012-07-02 MED ORDER — ALBUTEROL SULFATE (5 MG/ML) 0.5% IN NEBU
5.0000 mg | INHALATION_SOLUTION | Freq: Once | RESPIRATORY_TRACT | Status: AC
  Administered 2012-07-02: 5 mg via RESPIRATORY_TRACT
  Filled 2012-07-02: qty 1

## 2012-07-02 MED ORDER — POTASSIUM CHLORIDE 10 MEQ/100ML IV SOLN
10.0000 meq | Freq: Once | INTRAVENOUS | Status: AC
  Administered 2012-07-02: 10 meq via INTRAVENOUS
  Filled 2012-07-02: qty 100

## 2012-07-02 NOTE — ED Notes (Signed)
RN to obtain labs with start of IV 

## 2012-07-02 NOTE — ED Notes (Signed)
Pt presenting to ed with c/o shortness of breath. Pt states she say her pcp x 2 days ago and she [place] can't breath pt states she saw her pcp and was placed on medications but she doesn't feel any better. Pt with auditory wheezing in triage. Pt denies chest pain at this time. Pt states non-productive cough

## 2012-07-02 NOTE — ED Provider Notes (Signed)
History     CSN: [id number]  Arrival date & time 07/02/12  6213   First MD Initiated Contact with Patient 07/02/12 2017      Chief Complaint  Patient presents with  . Shortness of Breath    (Consider location/radiation/quality/duration/timing/severity/associated sxs/prior treatment) HPI Comments: Patient with history of copd with recent admissions.  Presents with increasing shortness of breath, doe since two days ago.  She denies any chest pains.  No fevers or productive cough.    Patient is a 79 y.o. female presenting with shortness of breath. The history is provided by the patient.  Shortness of Breath  The current episode started 2 days ago. The onset was sudden. The problem occurs continuously. The problem has been gradually worsening. The problem is moderate. The symptoms are relieved by beta-agonist inhalers. Nothing aggravates the symptoms. Associated symptoms include shortness of breath and wheezing. Pertinent negatives include no chest pain, no fever and no cough.    Past Medical History  Diagnosis Date  . History of colonic polyps   . COPD (chronic obstructive pulmonary disease)   . Diverticulitis of colon   . GERD (gastroesophageal reflux disease)   . HTN (hypertension)   . Osteoarthritis   . Hyperlipemia   . Aortic aneurysm, abdominal   . Ovarian cyst, left   . Psoriasis   . Chronic respiratory failure     Uses home oxygen at 2 L  . ADRENAL MASS 05/04/2007  . GLAUCOMA 03/30/2007  . HEMORRHOIDS, INTERNAL 03/23/2008  . OSTEOPOROSIS 03/30/2007  . SCOLIOSIS 03/30/2007  . PONV (postoperative nausea and vomiting)     Past Surgical History  Procedure Date  . Kidney stone surgery   . Appendectomy   . Tonsillectomy   . Hemiarthroplasty shoulder fracture   . Cataract extraction, bilateral   . Abdominal aortic aneurysm repair   . Abdominal hysterectomy     Family History  Problem Relation Age of Onset  . Heart attack Mother   . Heart attack Father   . Deep  vein thrombosis Father   . Cancer Brother     Pancreas and colon    History  Substance Use Topics  . Smoking status: Former Smoker -- 0.5 packs/day for 40 years    Types: Cigarettes    Quit date: 06/16/2009  . Smokeless tobacco: Never Used  . Alcohol Use: No    OB History    Grav Para Term Preterm Abortions TAB SAB Ect Mult Living                  Review of Systems  Constitutional: Negative for fever.  Respiratory: Positive for shortness of breath and wheezing. Negative for cough.   Cardiovascular: Negative for chest pain.  All other systems reviewed and are negative.    Allergies  Clarithromycin; Hydrocodone-guaifenesin; Iohexol; Moxifloxacin; Prednisone; and Sulfonamide derivatives  Home Medications   Current Outpatient Rx  Name  Route  Sig  Dispense  Refill  . ALBUTEROL SULFATE (2.5 MG/3ML) 0.083% IN NEBU   Nebulization   Take 2.5 mg by nebulization every 6 (six) hours as needed.         . ALBUTEROL SULFATE (5 MG/ML) 0.5% IN NEBU   Nebulization   Take 0.5 mLs (2.5 mg total) by nebulization every 2 (two) hours as needed for wheezing or shortness of breath.   20 mL   0   . ALPRAZOLAM 0.5 MG PO TABS   Oral   Take 0.5 mg by mouth 3 (three) times  daily as needed. For anxiety and sleep         . BUDESONIDE 0.25 MG/2ML IN SUSP   Nebulization   Take 0.25 mg by nebulization daily.         [name] HYDROCOD POLST-CPM POLST ER 10-8 MG/5ML PO LQCR   Oral   Take 5 mLs by mouth every 12 (twelve) hours as needed.         . DSS 100 MG PO CAPS   Oral   Take 100 mg by mouth 2 (two) times daily.         [name] ESOMEPRAZOLE MAGNESIUM 40 MG PO CPDR   Oral   Take 40 mg by mouth daily.          . FUROSEMIDE 20 MG PO TABS   Oral   Take 20 mg by mouth 2 (two) times daily.         [name] GARLIC 400 MG PO TBEC   Oral   Take 1 tablet by mouth daily.          [name] HYDROCODONE-ACETAMINOPHEN 5-500 MG PO TABS   Oral   Take 1 tablet by mouth every 6 (six) hours as needed.  pain         . LEVOFLOXACIN 500 MG PO TABS   Oral   Take 500 mg by mouth daily.         [name] LOSARTAN POTASSIUM 100 MG PO TABS   Oral   Take 100 mg by mouth daily.         [name] POTASSIUM CHLORIDE ER 10 MEQ PO CPCR   Oral   Take 10 mEq by mouth daily as needed.         [name] PREDNISONE 10 MG PO TABS      Take 4 for two days three for two days two for two days one for two days         . ROSUVASTATIN CALCIUM 10 MG PO TABS   Oral   Take 10 mg by mouth daily.         . AEROCHAMBER MV MISC      Use as instructed   1 each   0     BP 166/77  Pulse 119  Temp 98.3 F (36.8 C) (Oral)  Resp 24  SpO2 98%  Physical Exam  Nursing note and vitals reviewed. Constitutional: She is oriented to person, place, and time. She appears well-developed and well-nourished. No distress.  HENT:  Head: Normocephalic and atraumatic.  Mouth/Throat: Oropharynx is clear and moist.  Neck: Normal range of motion. Neck supple.  Cardiovascular: Normal rate and regular rhythm.   No murmur heard. Pulmonary/Chest:       There is bilateral wheezing present.  No rales.    Abdominal: Soft. Bowel sounds are normal.  Musculoskeletal: Normal range of motion. She exhibits no edema.  Neurological: She is alert and oriented to person, place, and time.  Skin: Skin is warm and dry. She is not diaphoretic.    ED Course  Procedures (including critical care time)  Labs Reviewed  CBC - Abnormal; Notable for the following:    RBC 3.23 (*)     Hemoglobin 9.1 (*)     HCT 29.2 (*)     All other components within normal limits  BASIC METABOLIC PANEL  TROPONIN I   Dg Chest 2 View (if Patient Has Fever And/or Copd)  07/02/2012  *RADIOLOGY REPORT*  Clinical Data: Shortness of breath and cough  CHEST -  2 VIEW  Comparison: 12/05/2013and CT abdomen pelvis 06/29/2009  Findings: Focal eventration of the posterior hemidiaphragms bilaterally, chronic.  Heart size normal. Tortuous and atherosclerotic thoracic aorta is  stable.  The lungs are hyperinflated and demonstrate probable emphysematous changes.  No focal airspace disease, effusion, or pneumothorax. Left shoulder hemiarthroplasty.  Diffuse osteopenia.  IMPRESSION: Emphysema/COPD with hyperinflation. No acute findings identified.   Original Report Authenticated By: Britta Mccreedy, M.D.      No diagnosis found.   Date: 07/03/2012  Rate: 102  Rhythm: sinus tachycardia  QRS Axis: normal  Intervals: normal  ST/T Wave abnormalities: normal  Conduction Disutrbances:none  Narrative Interpretation:   Old EKG Reviewed: unchanged    MDM  The patient presents with a two day history of doe, weakness.  She has recent admissions for copd and is on home oxygen.  Upon arrival, she had some wheezing and was given solumedrol and a nebulizer treatment.  This caused her heart rate to go up and she actually felt worse.  Workup reveals a low potassium and calcium, the etiology of which I am uncertain.  The was replaced with iv an oral potassium and iv calcium gluconate.  The chest xray does not reflect pneumonia or chf.  Medicine was called for admission, Dr. Lovell Sheehan to admit.        Geoffery Lyons, MD 07/03/12 [phone]

## 2012-07-03 ENCOUNTER — Encounter (HOSPITAL_COMMUNITY): Payer: Self-pay | Admitting: *Deleted

## 2012-07-03 DIAGNOSIS — J449 Chronic obstructive pulmonary disease, unspecified: Secondary | ICD-10-CM

## 2012-07-03 DIAGNOSIS — E876 Hypokalemia: Secondary | ICD-10-CM | POA: Diagnosis present

## 2012-07-03 DIAGNOSIS — D649 Anemia, unspecified: Secondary | ICD-10-CM | POA: Diagnosis present

## 2012-07-03 DIAGNOSIS — E8809 Other disorders of plasma-protein metabolism, not elsewhere classified: Secondary | ICD-10-CM | POA: Diagnosis present

## 2012-07-03 DIAGNOSIS — J441 Chronic obstructive pulmonary disease with (acute) exacerbation: Principal | ICD-10-CM

## 2012-07-03 DIAGNOSIS — J9610 Chronic respiratory failure, unspecified whether with hypoxia or hypercapnia: Secondary | ICD-10-CM

## 2012-07-03 LAB — CBC
HCT: 35.3 % — ABNORMAL LOW (ref 36.0–46.0)
Hemoglobin: 11 g/dL — ABNORMAL LOW (ref 12.0–15.0)
MCH: 28.4 pg (ref 26.0–34.0)
MCHC: 31.2 g/dL (ref 30.0–36.0)

## 2012-07-03 LAB — COMPREHENSIVE METABOLIC PANEL
BUN: 14 mg/dL (ref 6–23)
CO2: 26 mEq/L (ref 19–32)
Chloride: 103 mEq/L (ref 96–112)
Creatinine, Ser: 0.7 mg/dL (ref 0.50–1.10)
GFR calc Af Amer: >90 mL/min (ref >90)
GFR calc non Af Amer: 81 mL/min — ABNORMAL LOW (ref >90)
Glucose, Bld: 147 mg/dL — ABNORMAL HIGH (ref 70–99)
Total Bilirubin: 0.2 mg/dL — ABNORMAL LOW (ref 0.3–1.2)

## 2012-07-03 LAB — FERRITIN: Ferritin: 71 ng/mL (ref 10–291)

## 2012-07-03 LAB — NA AND K (SODIUM & POTASSIUM), RAND UR: Potassium Urine: 47 mEq/L

## 2012-07-03 LAB — VITAMIN B12: Vitamin B-12: 316 pg/mL (ref 211–911)

## 2012-07-03 LAB — RETICULOCYTES
RBC.: 3.87 MIL/uL (ref 3.87–5.11)
Retic Ct Pct: 1.4 % (ref 0.4–3.1)

## 2012-07-03 LAB — PRO B NATRIURETIC PEPTIDE: Pro B Natriuretic peptide (BNP): 674.3 pg/mL — ABNORMAL HIGH (ref 0–450)

## 2012-07-03 MED ORDER — DOCUSATE SODIUM 100 MG PO CAPS
100.0000 mg | ORAL_CAPSULE | Freq: Two times a day (BID) | ORAL | Status: DC
  Administered 2012-07-03 – 2012-07-06 (×6): 100 mg via ORAL
  Filled 2012-07-03 (×8): qty 1

## 2012-07-03 MED ORDER — OXYCODONE HCL 5 MG PO TABS: 5.0000 mg | ORAL_TABLET | ORAL | Status: DC | PRN

## 2012-07-03 MED ORDER — ALBUTEROL SULFATE (5 MG/ML) 0.5% IN NEBU: 2.5000 mg | INHALATION_SOLUTION | RESPIRATORY_TRACT | Status: DC | PRN

## 2012-07-03 MED ORDER — ALUM & MAG HYDROXIDE-SIMETH 200-200-20 MG/5ML PO SUSP: 30.0000 mL | Freq: Four times a day (QID) | ORAL | Status: DC | PRN

## 2012-07-03 MED ORDER — ALPRAZOLAM 0.5 MG PO TABS
0.5000 mg | ORAL_TABLET | Freq: Three times a day (TID) | ORAL | Status: DC
  Administered 2012-07-03 – 2012-07-06 (×8): 0.5 mg via ORAL
  Filled 2012-07-03 (×8): qty 1

## 2012-07-03 MED ORDER — METHYLPREDNISOLONE SODIUM SUCC 125 MG IJ SOLR
125.0000 mg | Freq: Four times a day (QID) | INTRAMUSCULAR | Status: DC
  Administered 2012-07-03: 125 mg via INTRAVENOUS
  Filled 2012-07-03 (×4): qty 2

## 2012-07-03 MED ORDER — SODIUM CHLORIDE 0.9 % IV SOLN
INTRAVENOUS | Status: DC
  Administered 2012-07-03 – 2012-07-06 (×2): via INTRAVENOUS

## 2012-07-03 MED ORDER — PANTOPRAZOLE SODIUM 40 MG PO TBEC
40.0000 mg | DELAYED_RELEASE_TABLET | Freq: Every day | ORAL | Status: DC
  Administered 2012-07-03 – 2012-07-04 (×2): 40 mg via ORAL
  Filled 2012-07-03 (×2): qty 1

## 2012-07-03 MED ORDER — SODIUM CHLORIDE 0.9 % IV SOLN
2.0000 g | Freq: Once | INTRAVENOUS | Status: AC
  Administered 2012-07-03: 2 g via INTRAVENOUS
  Filled 2012-07-03: qty 20

## 2012-07-03 MED ORDER — ALBUTEROL SULFATE (5 MG/ML) 0.5% IN NEBU
2.5000 mg | INHALATION_SOLUTION | RESPIRATORY_TRACT | Status: DC
  Administered 2012-07-03 – 2012-07-06 (×21): 2.5 mg via RESPIRATORY_TRACT
  Filled 2012-07-03 (×21): qty 0.5

## 2012-07-03 MED ORDER — ACETAMINOPHEN 650 MG RE SUPP: 650.0000 mg | Freq: Four times a day (QID) | RECTAL | Status: DC | PRN

## 2012-07-03 MED ORDER — ENOXAPARIN SODIUM 40 MG/0.4ML ~~LOC~~ SOLN
40.0000 mg | SUBCUTANEOUS | Status: DC
  Administered 2012-07-03 – 2012-07-06 (×4): 40 mg via SUBCUTANEOUS
  Filled 2012-07-03 (×4): qty 0.4

## 2012-07-03 MED ORDER — HYDROMORPHONE HCL PF 1 MG/ML IJ SOLN: 0.5000 mg | INTRAMUSCULAR | Status: DC | PRN

## 2012-07-03 MED ORDER — SODIUM CHLORIDE 0.9 % IJ SOLN
3.0000 mL | Freq: Two times a day (BID) | INTRAMUSCULAR | Status: DC
  Administered 2012-07-03 – 2012-07-06 (×4): 3 mL via INTRAVENOUS

## 2012-07-03 MED ORDER — METHYLPREDNISOLONE SODIUM SUCC 125 MG IJ SOLR
60.0000 mg | Freq: Three times a day (TID) | INTRAMUSCULAR | Status: AC
  Administered 2012-07-03 – 2012-07-04 (×3): 60 mg via INTRAVENOUS
  Filled 2012-07-03 (×3): qty 0.96

## 2012-07-03 MED ORDER — ZOLPIDEM TARTRATE 5 MG PO TABS
5.0000 mg | ORAL_TABLET | Freq: Every evening | ORAL | Status: DC | PRN
  Administered 2012-07-03 – 2012-07-04 (×2): 5 mg via ORAL
  Filled 2012-07-03 (×2): qty 1

## 2012-07-03 MED ORDER — ONDANSETRON HCL 4 MG/2ML IJ SOLN: 4.0000 mg | Freq: Four times a day (QID) | INTRAMUSCULAR | Status: DC | PRN

## 2012-07-03 MED ORDER — LOSARTAN POTASSIUM 50 MG PO TABS
100.0000 mg | ORAL_TABLET | Freq: Every day | ORAL | Status: DC
  Administered 2012-07-03 – 2012-07-06 (×4): 100 mg via ORAL
  Filled 2012-07-03 (×4): qty 2

## 2012-07-03 MED ORDER — ALPRAZOLAM 0.5 MG PO TABS
0.5000 mg | ORAL_TABLET | Freq: Every day | ORAL | Status: DC | PRN
  Administered 2012-07-03 – 2012-07-04 (×2): 0.5 mg via ORAL
  Filled 2012-07-03 (×2): qty 1

## 2012-07-03 MED ORDER — HYDRALAZINE HCL 20 MG/ML IJ SOLN: 5.0000 mg | Freq: Four times a day (QID) | INTRAMUSCULAR | Status: DC | PRN

## 2012-07-03 MED ORDER — ACETAMINOPHEN 325 MG PO TABS
650.0000 mg | ORAL_TABLET | Freq: Four times a day (QID) | ORAL | Status: DC | PRN
  Administered 2012-07-04: 650 mg via ORAL
  Filled 2012-07-03: qty 2

## 2012-07-03 MED ORDER — POTASSIUM CHLORIDE 10 MEQ/100ML IV SOLN
10.0000 meq | INTRAVENOUS | Status: AC
  Administered 2012-07-03 (×5): 10 meq via INTRAVENOUS
  Filled 2012-07-03 (×5): qty 100

## 2012-07-03 MED ORDER — BUDESONIDE 0.25 MG/2ML IN SUSP
0.2500 mg | Freq: Every day | RESPIRATORY_TRACT | Status: DC
  Administered 2012-07-03 – 2012-07-04 (×2): 0.25 mg via RESPIRATORY_TRACT
  Filled 2012-07-03 (×2): qty 2

## 2012-07-03 MED ORDER — LEVOFLOXACIN 500 MG PO TABS
500.0000 mg | ORAL_TABLET | Freq: Every day | ORAL | Status: DC
  Administered 2012-07-03 – 2012-07-06 (×4): 500 mg via ORAL
  Filled 2012-07-03 (×4): qty 1

## 2012-07-03 MED ORDER — ATORVASTATIN CALCIUM 20 MG PO TABS
20.0000 mg | ORAL_TABLET | Freq: Every day | ORAL | Status: DC
  Administered 2012-07-03 – 2012-07-05 (×3): 20 mg via ORAL
  Filled 2012-07-03 (×4): qty 1

## 2012-07-03 MED ORDER — METHYLPREDNISOLONE SODIUM SUCC 125 MG IJ SOLR
80.0000 mg | Freq: Three times a day (TID) | INTRAMUSCULAR | Status: DC
  Filled 2012-07-03: qty 1.28

## 2012-07-03 MED ORDER — HYDROCOD POLST-CHLORPHEN POLST 10-8 MG/5ML PO LQCR
5.0000 mL | Freq: Two times a day (BID) | ORAL | Status: DC | PRN
  Administered 2012-07-03 – 2012-07-06 (×4): 5 mL via ORAL
  Filled 2012-07-03 (×4): qty 5

## 2012-07-03 MED ORDER — IPRATROPIUM BROMIDE 0.02 % IN SOLN
0.5000 mg | RESPIRATORY_TRACT | Status: DC
  Administered 2012-07-03 – 2012-07-06 (×22): 0.5 mg via RESPIRATORY_TRACT
  Filled 2012-07-03 (×21): qty 2.5

## 2012-07-03 MED ORDER — ONDANSETRON HCL 4 MG PO TABS: 4.0000 mg | ORAL_TABLET | Freq: Four times a day (QID) | ORAL | Status: DC | PRN

## 2012-07-03 MED ORDER — FUROSEMIDE 20 MG PO TABS
20.0000 mg | ORAL_TABLET | Freq: Two times a day (BID) | ORAL | Status: DC
  Filled 2012-07-03: qty 1

## 2012-07-03 NOTE — Progress Notes (Signed)
Extremely SOB/ dyspneic with even minimal exertion.  When getting up to bsc or even using bedpan HR increases to 120's- 130's ST, becomes dyspneic with loud expiratory wheezing. Becomes very anxious.  Spoke with pt quietly and calmly regarding staying as calm as possible, not worrying about talking to visitors or on phone at this point. Understanding verb.,.  Spoke with son also.  Encouraged to let pt "just rest" if she is quiet and not having breathing difficulty. Son receptive.  'Usually takes approx. 10 min. For resp. Effort to decrease and HR to return back to 90's to low 100's after any exertion. At present appears to be sleeping. PRN dose of xanax had been given at 1245 with pt as calm as she has been today.  Temp omitted at Rt check as pt asleep. Did not want to disturb and have pt become overly anxious again.  Will cont to monitor and assess/ carry out measures to help breathing effort.

## 2012-07-03 NOTE — Progress Notes (Signed)
[facility] PROGRESS NOTE  [name] NWG:[id number] DOB: [birth year] DOA: 07/02/2012 PCP: [name], MD  Assessment/Plan: Acute on chronic respiratory failure due to COPD exacerbation Continue IV steroids, neb treatments, pulmocort, and neb treatments. Continue oxygen (home dose 3 L/min), currently on 4 L/min.  Hypokalemia Resolved. Was supplemented. Suspect initial lab is an error.  Hypocalcemia Resolved. Was supplemented. Suspect initial lab is an error.  Hypoalbuminemia Improved, again suspect initial lab was an error. Continue to monitor.  Hypertension Stable continue home losartan dose.  Anemia Hemoglobin improved. Continue to monitor. Check anemia panel in the morning.  Hyperlipidemia Continue simvastatin.  Anxiety Xanax  Code Status: DNR Family Communication: No family at bedside. Patient updated. Disposition Plan: Pending improvement in breathing.   Consultants:  None  Procedures:  None  Antibiotics:  Levofloxacin 1/18 >>   HPI/Subjective: [name] short of breath and wheezing.  Objective: Filed Vitals:   07/02/12 2354 07/03/12 0019 07/03/12 0437 07/03/12 0605  BP: 156/81 161/85 169/99   Pulse: 96 102 91   Temp: 97.9 F (36.6 C) 98 F (36.7 C) 98.7 F (37.1 C)   TempSrc: Oral  Oral   Resp: 23  18   Height:  5' (1.524 m)    Weight:  60.7 kg (133 lb 13.1 oz)    SpO2: 93% 96% 99% 92%    Intake/Output Summary (Last 24 hours) at 07/03/12 0819 Last data filed at 07/03/12 0700  Gross per 24 hour  Intake  57.17 ml  Output     50 ml  Net   7.17 ml   Filed Weights   07/03/12 0019  Weight: 60.7 kg (133 lb 13.1 oz)    Exam: Physical Exam: General: Awake, Oriented, in some distress from shortness of breath. HEENT: EOMI. Neck: Supple CV: S1 and S2 Lungs: Expiratory wheezing bilaterally, moderate air movement. Abdomen: Soft, Nontender, Nondistended, +bowel sounds. Ext: Good pulses. Trace edema.  Data Reviewed: Basic Metabolic  Panel:  Lab 07/03/12 0630 07/02/12 1955  NA 138 144  K 4.8 2.0*  CL 103 117*  CO2 26 17*  GLUCOSE 147* 83  BUN 14 11  CREATININE 0.70 0.39*  CALCIUM 10.2 5.2*  MG -- --  PHOS -- --   Liver Function Tests:  Lab 07/03/12 0630 07/02/12 1955  AST 21 17  ALT 18 13  ALKPHOS 80 44  BILITOT 0.2* 0.1*  PROT 6.4 3.6*  ALBUMIN 3.4* 1.9*   No results found for this basename: LIPASE:5,AMYLASE:5 in the last 168 hours No results found for this basename: AMMONIA:5 in the last 168 hours CBC:  Lab 07/03/12 0630 07/02/12 1955  WBC 5.8 6.0  NEUTROABS -- --  HGB 11.0* 9.1*  HCT 35.3* 29.2*  MCV 91.2 90.4  PLT 228 223   Cardiac Enzymes:  Lab 07/02/12 2011  CKTOTAL --  CKMB --  CKMBINDEX --  TROPONINI <0.30   BNP (last 3 results)  Basename 07/03/12 0630 05/21/12 0506  PROBNP 674.3* 383.1   CBG: No results found for this basename: GLUCAP:5 in the last 168 hours  No results found for this or any previous visit (from the past 240 hour(s)).   Studies: Dg Chest 2 View (if Patient Has Fever And/or Copd)  07/02/2012  *RADIOLOGY REPORT*  Clinical Data: Shortness of breath and cough  CHEST - 2 VIEW  Comparison: 12/05/2013and CT abdomen pelvis 06/29/2009  Findings: Focal eventration of the posterior hemidiaphragms bilaterally, chronic.  Heart size normal. Tortuous and atherosclerotic thoracic aorta is stable.  The  lungs are hyperinflated and demonstrate probable emphysematous changes.  No focal airspace disease, effusion, or pneumothorax. Left shoulder hemiarthroplasty.  Diffuse osteopenia.  IMPRESSION: Emphysema/COPD with hyperinflation. No acute findings identified.   Original Report Authenticated By: Britta Mccreedy, M.D.     Scheduled Meds:   . ipratropium  0.5 mg Nebulization Q4H   And  . albuterol  2.5 mg Nebulization Q4H  . ALPRAZolam  0.5 mg Oral TID  . atorvastatin  20 mg Oral q1800  . budesonide  0.25 mg Nebulization Daily  . docusate sodium  100 mg Oral BID  . enoxaparin  (LOVENOX) injection  40 mg Subcutaneous Q24H  . levofloxacin  500 mg Oral Daily  . losartan  100 mg Oral Daily  . methylPREDNISolone (SOLU-MEDROL) injection  60 mg Intravenous Q8H  . pantoprazole  40 mg Oral Daily  . sodium chloride  3 mL Intravenous Q12H   Continuous Infusions:   . sodium chloride 10 mL/hr at 07/03/12 0135    Principal Problem:  *COPD exacerbation Active Problems:  HYPERTENSION  Chronic respiratory failure  Hypocalcemia  Hypokalemia  Hypoalbuminemia  Anemia   [name] A  [facility] Pager [phone]. If 8PM-8AM, please contact night-coverage at [email or web address], password [facility] 07/03/2012, 8:19 AM  LOS: 1 day

## 2012-07-03 NOTE — H&P (Addendum)
[facility] History and Physical  [name] ZOX:[id number] DOB: 17-Dec-1930 DOA: 07/02/2012  Referring physician: EDP PCP: [name], MD  Specialists:   Chief Complaint: SOB  HPI: [name] is a 79 y.o. female with O2 dependent COPD who presents to the ED with complaints of worsening SOB over 2 weeks, but even worse over the past 2 days.  She was seen by her pulmonologist Dr. Delford Field and was administered prednisone therapy and placed on Levaquin, 2 days ago.  She report having a cough productive of white mucous, and denies having fevers or chills.  She does also report having nausea, but no vomiting, and 1 loose stool.   She also has complaints of edema in both of her lower legs.   In the ED she was found to have hypokalemia, and hypocalcemia, and was referred for admission for a COPD Exacerbation.      Review of Systems: The patient denies anorexia, fever, weight loss,, vision loss, decreased hearing, hoarseness, chest pain, syncope, dyspnea on exertion, peripheral edema, balance deficits, hemoptysis, abdominal pain, melena, hematochezia, severe indigestion/heartburn, hematuria, incontinence, genital sores, muscle weakness, suspicious skin lesions, transient blindness, difficulty walking, depression, unusual weight change, abnormal bleeding, enlarged lymph nodes, angioedema, and breast masses.    Past Medical History  Diagnosis Date  . History of colonic polyps   . COPD (chronic obstructive pulmonary disease)   . Diverticulitis of colon   . GERD (gastroesophageal reflux disease)   . HTN (hypertension)   . Osteoarthritis   . Hyperlipemia   . Aortic aneurysm, abdominal   . Ovarian cyst, left   . Psoriasis   . Chronic respiratory failure     Uses home oxygen at 2 L  . ADRENAL MASS 05/04/2007  . GLAUCOMA 03/30/2007  . HEMORRHOIDS, INTERNAL 03/23/2008  . OSTEOPOROSIS 03/30/2007  . SCOLIOSIS 03/30/2007  . PONV (postoperative nausea and vomiting)    Past Surgical History    Procedure Date  . Kidney stone surgery   . Appendectomy   . Tonsillectomy   . Hemiarthroplasty shoulder fracture   . Cataract extraction, bilateral   . Abdominal aortic aneurysm repair   . Abdominal hysterectomy      Medications:  HOME MEDS: Prior to Admission medications   Medication Sig Start Date End Date Taking? Authorizing Provider  albuterol (PROVENTIL) (2.5 MG/3ML) 0.083% nebulizer solution Take 2.5 mg by nebulization every 6 (six) hours as needed. 06/30/12  Yes Storm Frisk, MD  albuterol (PROVENTIL) (5 MG/ML) 0.5% nebulizer solution Take 0.5 mLs (2.5 mg total) by nebulization every 2 (two) hours as needed for wheezing or shortness of breath. 04/30/12  Yes Lesle Chris Black, NP  ALPRAZolam Prudy Feeler) 0.5 MG tablet Take 0.5 mg by mouth 3 (three) times daily as needed. For anxiety and sleep 06/28/12  Yes Dianne Dun, MD  budesonide (PULMICORT) 0.25 MG/2ML nebulizer solution Take 0.25 mg by nebulization daily. 06/30/12  Yes Storm Frisk, MD  chlorpheniramine-HYDROcodone (TUSSIONEX) 10-8 MG/5ML LQCR Take 5 mLs by mouth every 12 (twelve) hours as needed. 06/30/12  Yes Storm Frisk, MD  Docusate Sodium (DSS) 100 MG CAPS Take 100 mg by mouth 2 (two) times daily. 04/30/12  Yes Lesle Chris Black, NP  esomeprazole (NEXIUM) 40 MG capsule Take 40 mg by mouth daily.    Yes Historical Provider, MD  furosemide (LASIX) 20 MG tablet Take 20 mg by mouth 2 (two) times daily. 06/24/12  Yes Dianne Dun, MD  Garlic (GARLIQUE) 400 MG TBEC Take  1 tablet by mouth daily.    Yes Historical Provider, MD  HYDROcodone-acetaminophen (VICODIN) 5-500 MG per tablet Take 1 tablet by mouth every 6 (six) hours as needed. pain 06/24/12  Yes Dianne Dun, MD  levofloxacin (LEVAQUIN) 500 MG tablet Take 500 mg by mouth daily. 06/24/12 07/04/12 Yes Dianne Dun, MD  losartan (COZAAR) 100 MG tablet Take 100 mg by mouth daily.   Yes Historical Provider, MD  potassium chloride (MICRO-K) 10 MEQ CR capsule Take 10 mEq by mouth daily  as needed. 06/24/12 06/24/13 Yes Dianne Dun, MD  predniSONE (DELTASONE) 10 MG tablet Take 4 for two days three for two days two for two days one for two days 06/30/12  Yes Storm Frisk, MD  rosuvastatin (CRESTOR) 10 MG tablet Take 10 mg by mouth daily.   Yes Historical Provider, MD  Spacer/Aero-Holding Chambers (AEROCHAMBER MV) inhaler Use as instructed 05/10/12   [name], NP    Allergies:  Allergies  Allergen Reactions  . Clarithromycin     REACTION: abd pain  . Hydrocodone-Guaifenesin     REACTION: feels funny  . Iohexol      Desc: pt reports dyspnea and throat swelling about age 20 w/ IV contrast reaction when kidneys were being checked; 07/25/08 SLG   . Moxifloxacin     REACTION: hallucinations  . Prednisone Other (See Comments)    Can take but at a lower dosage  . Sulfonamide Derivatives     REACTION: unknown    Social History:   reports that she quit smoking about 3 years ago. Her smoking use included Cigarettes. She has a 20 pack-year smoking history. She has never used smokeless tobacco. She reports that she does not drink alcohol or use illicit drugs.  Family History: Family History  Problem Relation Age of Onset  . Heart attack Mother   . Heart attack Father   . Deep vein thrombosis Father   . Cancer Brother     Pancreas and colon     Physical Exam:  GEN:  Pleasant 79 year old elderly well nourished and well developed Caucasian Female examined and in no acute distress; cooperative with exam Filed Vitals:   07/02/12 1925 07/02/12 2110 07/02/12 2354 07/03/12 0019  BP:   156/81 161/85  Pulse:   96 102  Temp:   97.9 F (36.6 C) 98 F (36.7 C)  TempSrc:   Oral   Resp:   23   Height:    5' (1.524 m)  Weight:    60.7 kg (133 lb 13.1 oz)  SpO2: 98% 100% 93% 96%   Blood pressure 161/85, pulse 102, temperature 98 F (36.7 C), temperature source Oral, resp. rate 23, height 5' (1.524 m), weight 60.7 kg (133 lb 13.1 oz), SpO2 96.00%. PSYCH: She is alert  and oriented x4; does not appear anxious does not appear depressed; affect is normal HEENT: Normocephalic and Atraumatic, Mucous membranes pink; PERRLA; EOM intact; Fundi:  Benign;  No scleral icterus, Nares: Patent, Oropharynx: Clear, Fair Dentition, Neck:  FROM, no cervical lymphadenopathy nor thyromegaly or carotid bruit; no JVD; Breasts:: Not examined CHEST WALL: No tenderness CHEST: Diminished Breath Sounds, tachypnea, Occasional expiratory Wheeze, No rales, No Rhonchi.    HEART: Regular rate and rhythm; no murmurs rubs or gallops BACK: No kyphosis or scoliosis; no CVA tenderness ABDOMEN: Positive Bowel Sounds,soft non-tender; no masses, no organomegaly. Rectal Exam: Not done EXTREMITIES: No bone or joint deformity; age-appropriate arthropathy of the hands and knees; no  cyanosis, clubbing or edema; no ulcerations. Genitalia: not examined PULSES: 2+ and symmetric SKIN: Normal hydration no rash or ulceration CNS: Cranial nerves 2-12 grossly intact no focal neurologic deficit    Labs on Admission:  Basic Metabolic Panel:  Lab 07/02/12 1610  NA 144  K 2.0*  CL 117*  CO2 17*  GLUCOSE 83  BUN 11  CREATININE 0.39*  CALCIUM 5.2*  MG --  PHOS --   Liver Function Tests:  Lab 07/02/12 1955  AST 17  ALT 13  ALKPHOS 44  BILITOT 0.1*  PROT 3.6*  ALBUMIN 1.9*   No results found for this basename: LIPASE:5,AMYLASE:5 in the last 168 hours No results found for this basename: AMMONIA:5 in the last 168 hours CBC:  Lab 07/02/12 1955  WBC 6.0  NEUTROABS --  HGB 9.1*  HCT 29.2*  MCV 90.4  PLT 223   Cardiac Enzymes:  Lab 07/02/12 2011  CKTOTAL --  CKMB --  CKMBINDEX --  TROPONINI <0.30    BNP (last 3 results)  Basename 05/21/12 0506  PROBNP 383.1   CBG: No results found for this basename: GLUCAP:5 in the last 168 hours  Radiological Exams on Admission: Dg Chest 2 View (if Patient Has Fever And/or Copd)  07/02/2012  *RADIOLOGY REPORT*  Clinical Data: Shortness of  breath and cough  CHEST - 2 VIEW  Comparison: 12/05/2013and CT abdomen pelvis 06/29/2009  Findings: Focal eventration of the posterior hemidiaphragms bilaterally, chronic.  Heart size normal. Tortuous and atherosclerotic thoracic aorta is stable.  The lungs are hyperinflated and demonstrate probable emphysematous changes.  No focal airspace disease, effusion, or pneumothorax. Left shoulder hemiarthroplasty.  Diffuse osteopenia.  IMPRESSION: Emphysema/COPD with hyperinflation. No acute findings identified.   Original Report Authenticated By: Britta Mccreedy, M.D.     EKG: Independently reviewed.  Sinus tachycardia at 104, No Acute S-T changes  Assessment: Principal Problem:  *COPD exacerbation Active Problems:  Chronic respiratory failure  Hypocalcemia  Hypokalemia  HYPERTENSION  Hypoalbuminemia  Anemia  Edema    Plan:    1.   COPD - admitted to telemetry Bed, Placed on High dose IV steroid taper, Duonebs, and continue Levaquin PO.    2.   Chronic Respiratory Failure-Monitor O2 sats, Titrate O2 PRN.     3.   Hypocalcemia -  Differential includes calcium wasting, or paraneoplastic syndrome, Replete Calcium 4.   Hypokalemia-  Due to Lasix therapy or due to renal loss, check Urine electrolytes and OSM, Replete K+ and check Magnesium Replete Magnesium if Needed. 5.   Hypoalbuminemia- due to Volume overload, or protein wasting and malnutrition,  May need nutrition consultation for protein supplementation.   6.   Edema- a consequence of hypoabuminemia, placed on CHF protocol to diurese.  Check BNP.    7.   Hypertension- should improve with improvement of her SOB symptoms, and diuresis, and home medical regimen.   PRN IV hydralazine also ordered.    8.   Anemia-  Most likely exacerbated by fluid overload, and should improve with diuresis, however will check anemia panel 9.   Other- Medications reconciled 10. DVT prophylaxis with Lovenox.       Code Status:  DNR (DO NOT RESUSCITATE) Family  Communication:  Family not Present Disposition Plan:  Return to Home  Time spent:  37 Minutes   Ron Parker [facility] Pager [phone]   If 7PM-7AM, please contact night-coverage [email or web address] Password [email or web address] 07/03/2012, 1:42 AM

## 2012-07-04 ENCOUNTER — Encounter (HOSPITAL_COMMUNITY): Payer: Self-pay | Admitting: *Deleted

## 2012-07-04 DIAGNOSIS — R0609 Other forms of dyspnea: Secondary | ICD-10-CM

## 2012-07-04 DIAGNOSIS — F411 Generalized anxiety disorder: Secondary | ICD-10-CM

## 2012-07-04 DIAGNOSIS — K219 Gastro-esophageal reflux disease without esophagitis: Secondary | ICD-10-CM

## 2012-07-04 DIAGNOSIS — D649 Anemia, unspecified: Secondary | ICD-10-CM

## 2012-07-04 LAB — BASIC METABOLIC PANEL
Calcium: 9.6 mg/dL (ref 8.4–10.5)
GFR calc Af Amer: 67 mL/min — ABNORMAL LOW (ref >90)
GFR calc non Af Amer: 58 mL/min — ABNORMAL LOW (ref >90)
Potassium: 4.3 mEq/L (ref 3.5–5.1)
Sodium: 139 mEq/L (ref 135–145)

## 2012-07-04 LAB — MAGNESIUM: Magnesium: 2.1 mg/dL (ref 1.5–2.5)

## 2012-07-04 LAB — CBC
Hemoglobin: 10 g/dL — ABNORMAL LOW (ref 12.0–15.0)
MCHC: 31 g/dL (ref 30.0–36.0)
Platelets: 217 10*3/uL (ref 150–400)
RBC: 3.53 MIL/uL — ABNORMAL LOW (ref 3.87–5.11)

## 2012-07-04 MED ORDER — PANTOPRAZOLE SODIUM 40 MG PO TBEC
40.0000 mg | DELAYED_RELEASE_TABLET | Freq: Two times a day (BID) | ORAL | Status: DC
  Administered 2012-07-04 – 2012-07-06 (×4): 40 mg via ORAL
  Filled 2012-07-04 (×5): qty 1

## 2012-07-04 MED ORDER — ALPRAZOLAM 0.25 MG PO TABS
0.2500 mg | ORAL_TABLET | Freq: Two times a day (BID) | ORAL | Status: DC | PRN
  Administered 2012-07-05 – 2012-07-06 (×3): 0.25 mg via ORAL
  Filled 2012-07-04 (×3): qty 1

## 2012-07-04 MED ORDER — FUROSEMIDE 20 MG PO TABS
20.0000 mg | ORAL_TABLET | Freq: Two times a day (BID) | ORAL | Status: DC
  Administered 2012-07-04 – 2012-07-06 (×4): 20 mg via ORAL
  Filled 2012-07-04 (×6): qty 1

## 2012-07-04 MED ORDER — SALINE SPRAY 0.65 % NA SOLN
1.0000 | NASAL | Status: DC | PRN
  Administered 2012-07-04: 1 via NASAL
  Filled 2012-07-04: qty 44

## 2012-07-04 MED ORDER — METHYLPREDNISOLONE SODIUM SUCC 125 MG IJ SOLR
60.0000 mg | Freq: Four times a day (QID) | INTRAMUSCULAR | Status: DC
  Filled 2012-07-04 (×4): qty 0.96

## 2012-07-04 MED ORDER — OXYMETAZOLINE HCL 0.05 % NA SOLN
1.0000 | Freq: Two times a day (BID) | NASAL | Status: DC
  Administered 2012-07-04 – 2012-07-06 (×4): 1 via NASAL
  Filled 2012-07-04: qty 15

## 2012-07-04 MED ORDER — MORPHINE SULFATE 15 MG PO TABS
15.0000 mg | ORAL_TABLET | ORAL | Status: DC | PRN
  Administered 2012-07-06: 15 mg via ORAL
  Filled 2012-07-04: qty 1

## 2012-07-04 MED ORDER — METHYLPREDNISOLONE SODIUM SUCC 125 MG IJ SOLR
60.0000 mg | Freq: Two times a day (BID) | INTRAMUSCULAR | Status: DC
  Administered 2012-07-05 – 2012-07-06 (×4): 60 mg via INTRAVENOUS
  Filled 2012-07-04 (×5): qty 0.96

## 2012-07-04 MED ORDER — BUDESONIDE 0.25 MG/2ML IN SUSP
0.2500 mg | Freq: Two times a day (BID) | RESPIRATORY_TRACT | Status: DC
  Administered 2012-07-04 – 2012-07-06 (×4): 0.25 mg via RESPIRATORY_TRACT
  Filled 2012-07-04 (×6): qty 2

## 2012-07-04 NOTE — Progress Notes (Signed)
[facility] PROGRESS NOTE  [name] Lohn ZOX:[id number] DOB: [birth year] DOA: 07/02/2012 PCP: [name], MD  Assessment/Plan: Acute on chronic respiratory failure due to COPD exacerbation, mildly more dyspneic than baseline.  [name] of breath with speaking.  Patient had questions about treatments which may make her less [name] of breath than she currently is.  Advised her to speak to Dr. Delford Field about what treatment modalities are available to her.  She is already on LABA with SABA prn, ICS, ACH, O2.   -  Offered palliative treatments such as increased benzodiazepine and morphine IR -  Advised patient that she will likely only feel marginally better than she currently does -  She denied sinus symptoms altogether -  Does have GERD symptoms:  Maximize PPI -  Continue IV steroids, neb treatments, pulmocort -  Continue oxygen at 3L -  Walking pulse oximetry -  ECHO -  Consider increasing lasix  Hypertension, labile blood pressures, may be due to anxiety from dyspnea -  Stable continue home losartan dose. -  Continue prn hydralazine -  May need additional blood pressure agent  Anemia, Hemoglobin has fluctuated but appears to be stable around 10.   Hyperlipidemia, Continue simvastatin.  Anxiety Continue Xanax Adding morphine today for dyspnea   Code Status: DNR Family Communication: Family, including two sons and daughter, updated with plan of care. Disposition Plan:  Pending improvement in breathing, possibly to SNF, although patient currently reluctant to consider this option.  Addressed today and will discuss again tomorrow with patient and family.    Consultants:  None  Procedures:  None  Antibiotics:  Levofloxacin 1/18 >>   HPI/Subjective: [name] of breath and wheezing, about the same as yesterday.  Denies chest pain, N/V/D/C.    Objective: Filed Vitals:   07/04/12 1154 07/04/12 1454 07/04/12 1536 07/04/12 1608  BP:  180/91 143/81   Pulse:  121 121   Temp:   97.8 F (36.6 C)    TempSrc:  Oral    Resp:  22    Height:      Weight:      SpO2: 95% 95% 96% 96%    Intake/Output Summary (Last 24 hours) at 07/04/12 1625 Last data filed at 07/04/12 1454  Gross per 24 hour  Intake    720 ml  Output    350 ml  Net    370 ml   Filed Weights   07/03/12 0019 07/04/12 0459  Weight: 60.7 kg (133 lb 13.1 oz) 61.4 kg (135 lb 5.8 oz)    Exam: Physical Exam: General: Awake, Oriented, moderate respiratory distress with SCM, subcostal retractions.  Able to speak in [name] sentences, but then needs to pause to catch breath.   HEENT: MMM. Neck: Supple CV: Tachycardic, regular rhythm, normal S1 and S2, 1/6 murmur at LSB, no rubs or gallops Lungs:  Diminished BS throughout with moderate pitched full expiratory wheezing bilaterally, no rales or rhonchi.  Prolonged I:E Abdomen: Soft, Nontender, Nondistended, +bowel sounds. Ext: 2+ pulses. No edema.  Data Reviewed: Basic Metabolic Panel:  Lab 07/04/12 8119 07/03/12 0630 07/02/12 1955  NA 139 138 144  K 4.3 4.8 2.0*  CL 105 103 117*  CO2 27 26 17*  GLUCOSE 155* 147* 83  BUN 23 14 11   CREATININE 0.92 0.70 0.39*  CALCIUM 9.6 10.2 5.2*  MG 2.1 -- --  PHOS -- -- --   Liver Function Tests:  Lab 07/03/12 0630 07/02/12 1955  AST 21 17  ALT 18 13  ALKPHOS 80 44  BILITOT 0.2* 0.1*  PROT 6.4 3.6*  ALBUMIN 3.4* 1.9*   No results found for this basename: LIPASE:5,AMYLASE:5 in the last 168 hours No results found for this basename: AMMONIA:5 in the last 168 hours CBC:  Lab 07/04/12 0445 07/03/12 0630 07/02/12 1955  WBC 6.3 5.8 6.0  NEUTROABS -- -- --  HGB 10.0* 11.0* 9.1*  HCT 32.3* 35.3* 29.2*  MCV 91.5 91.2 90.4  PLT 217 228 223   Cardiac Enzymes:  Lab 07/02/12 2011  CKTOTAL --  CKMB --  CKMBINDEX --  TROPONINI <0.30   BNP (last 3 results)  Basename 07/03/12 0630 05/21/12 0506  PROBNP 674.3* 383.1   CBG: No results found for this basename: GLUCAP:5 in the last 168 hours  No  results found for this or any previous visit (from the past 240 hour(s)).   Studies: Dg Chest 2 View (if Patient Has Fever And/or Copd)  07/02/2012  *RADIOLOGY REPORT*  Clinical Data: Shortness of breath and cough  CHEST - 2 VIEW  Comparison: 12/05/2013and CT abdomen pelvis 06/29/2009  Findings: Focal eventration of the posterior hemidiaphragms bilaterally, chronic.  Heart size normal. Tortuous and atherosclerotic thoracic aorta is stable.  The lungs are hyperinflated and demonstrate probable emphysematous changes.  No focal airspace disease, effusion, or pneumothorax. Left shoulder hemiarthroplasty.  Diffuse osteopenia.  IMPRESSION: Emphysema/COPD with hyperinflation. No acute findings identified.   Original Report Authenticated By: Britta Mccreedy, M.D.     Scheduled Meds:    . ipratropium  0.5 mg Nebulization Q4H   And  . albuterol  2.5 mg Nebulization Q4H  . ALPRAZolam  0.5 mg Oral TID  . atorvastatin  20 mg Oral q1800  . budesonide  0.25 mg Nebulization BID  . docusate sodium  100 mg Oral BID  . enoxaparin (LOVENOX) injection  40 mg Subcutaneous Q24H  . levofloxacin  500 mg Oral Daily  . losartan  100 mg Oral Daily  . methylPREDNISolone (SOLU-MEDROL) injection  60 mg Intravenous Q12H  . pantoprazole  40 mg Oral Daily  . sodium chloride  3 mL Intravenous Q12H   Continuous Infusions:    . sodium chloride 10 mL/hr at 07/03/12 0135    Principal Problem:  *COPD exacerbation Active Problems:  HYPERTENSION  Chronic respiratory failure  Hypocalcemia  Hypokalemia  Hypoalbuminemia  Anemia   [name]  [facility] Pager [phone]. If 7PM-7AM, please contact night-coverage at [email or web address], password [facility] 07/04/2012, 4:25 PM  LOS: 2 days

## 2012-07-04 NOTE — Evaluation (Signed)
Physical Therapy Evaluation Patient Details Name: [name] MRN: [id number] DOB: [birth year] Today's Date: 07/04/2012 Time: 1330-1400 PT Time Calculation (min): 30 min  PT Assessment / Plan / Recommendation Clinical Impression  Pt presents with COPD exacerbation with this being 3rd episode of care in 3 months for pt.  She demos VERY limited endurance during activity and gets very SOB (4/4 dyspnea).  Tolerated approx 18' of ambulation, however with increased DOE was assisted back to recliner.  Pt will benefit from skilled PT in acute venue to address deficits.  PT recommends SNF for follow up therapy at D/C to maximize pts safety.     PT Assessment  Patient needs continued PT services    Follow Up Recommendations  SNF;Supervision/Assistance - 24 hour    Does the patient have the potential to tolerate intense rehabilitation      Barriers to Discharge Decreased caregiver support      Equipment Recommendations  None recommended by PT    Recommendations for Other Services OT consult   Frequency Min 3X/week    Precautions / Restrictions Precautions Precautions: Fall Precaution Comments: monitor O2, pt with very limited endurance.  Restrictions Weight Bearing Restrictions: No   Pertinent Vitals/Pain No pain, just SOB      Mobility  Bed Mobility Bed Mobility: Supine to Sit Supine to Sit: 5: Supervision Details for Bed Mobility Assistance: Supervision for safety.  Noted pt gets very SOB with any movement.   Transfers Transfers: Sit to Stand;Stand to Sit Sit to Stand: 4: Min guard;With upper extremity assist;From bed Stand to Sit: 4: Min guard;With upper extremity assist;With armrests;To chair/3-in-1 Details for Transfer Assistance: Min/guard for safety with cues for hand placement and safety.  Ambulation/Gait Ambulation/Gait Assistance: 1: +2 Total assist Ambulation/Gait: Patient Percentage: 70% Ambulation Distance (Feet): 18 Feet Assistive device: Rolling  walker Ambulation/Gait Assistance Details: Pt with very slow gait speed and demos VERY limited endurance.  Pt with 4/4 dyspnea with ambulation.  Increased cues for slow deep pursed lip breathing, however pt with audible wheeze and unable to fully catch her breath.  Assisted pt to recliner.  Ambulated on 3L O2 with SaO2 93% prior to ambulation and 91% following ambulation with HR at 123 after activity. RN notified.  Gait Pattern: Step-through pattern;Decreased stride length;Trunk flexed Gait velocity: VERY decreased Stairs: No Wheelchair Mobility Wheelchair Mobility: No    Shoulder Instructions     Exercises     PT Diagnosis: Difficulty walking;Generalized weakness  PT Problem List: Decreased strength;Decreased activity tolerance;Decreased balance;Decreased mobility;Decreased safety awareness;Decreased knowledge of precautions;Cardiopulmonary status limiting activity PT Treatment Interventions: DME instruction;Gait training;Functional mobility training;Therapeutic activities;Therapeutic exercise;Balance training;Patient/family education   PT Goals Acute Rehab PT Goals PT Goal Formulation: With patient/family Time For Goal Achievement: 07/18/12 Potential to Achieve Goals: Fair Pt will go Sit to Supine/Side: with modified independence PT Goal: Sit to Supine/Side - Progress: Goal set today Pt will go Sit to Stand: with supervision PT Goal: Sit to Stand - Progress: Goal set today Pt will go Stand to Sit: with supervision PT Goal: Stand to Sit - Progress: Goal set today Pt will Perform Home Exercise Program: with supervision, verbal cues required/provided PT Goal: Perform Home Exercise Program - Progress: Goal set today Additional Goals Additional Goal #1: Pt will demonstrate/verbalize understanding of energy conservation techniques during mobility to decrease WOB.  PT Goal: Additional Goal #1 - Progress: Goal set today  Visit Information  Last PT Received On: 07/04/12 Assistance Needed:  +2 (safety, O2)    Subjective  Data  Subjective: I've been here 3 times in 3 months Patient Stated Goal: n/a   Prior Functioning  Home Living Lives With: Spouse Available Help at Discharge: Family Type of Home: House Home Access: Stairs to enter Secretary/administrator of Steps: 4 Entrance Stairs-Rails: Right Home Layout: One level Bathroom Shower/Tub: Naval architect Equipment: Bedside commode/3-in-1;Walker - rolling;Wheelchair - manual Prior Function Level of Independence: Independent with assistive device(s) Comments: Pt states she was doing ADL's for herself, however could barely get up and get to the bathroom due to SOB>  Communication Communication: No difficulties    Cognition  Overall Cognitive Status: Appears within functional limits for tasks assessed/performed Arousal/Alertness: Awake/alert Orientation Level: Appears intact for tasks assessed Behavior During Session: Anxious    Extremity/Trunk Assessment Right Lower Extremity Assessment RLE ROM/Strength/Tone: Deficits RLE ROM/Strength/Tone Deficits: Pt with generalized muscle weakness, grossly 3+/5 per functional assessment.  RLE Sensation: WFL - Light Touch Left Lower Extremity Assessment LLE ROM/Strength/Tone: Deficits LLE ROM/Strength/Tone Deficits: Pt with generalized muscle weakness, grossly 3+/5 per functional assessment.  LLE Sensation: WFL - Light Touch Trunk Assessment Trunk Assessment: Kyphotic   Balance    End of Session PT - End of Session Equipment Utilized During Treatment: Oxygen Activity Tolerance: Patient limited by fatigue;Treatment limited secondary to medical complications (Comment) Patient left: in chair;with call bell/phone within reach;with family/visitor present Nurse Communication: Mobility status  GP     Vista Deck 07/04/2012, 3:27 PM

## 2012-07-04 NOTE — Progress Notes (Signed)
Pt taken off floor to see spouse in ICU. Traveled by w/c, with iv & o2, off telemetry. Tolerated well, spending about 5 minutes visiting. Returned to room. [name], [employer]

## 2012-07-05 DIAGNOSIS — J9610 Chronic respiratory failure, unspecified whether with hypoxia or hypercapnia: Secondary | ICD-10-CM

## 2012-07-05 DIAGNOSIS — R0602 Shortness of breath: Secondary | ICD-10-CM

## 2012-07-05 LAB — CBC
HCT: 35 % — ABNORMAL LOW (ref 36.0–46.0)
Platelets: 240 10*3/uL (ref 150–400)
RBC: 3.82 MIL/uL — ABNORMAL LOW (ref 3.87–5.11)
RDW: 14.8 % (ref 11.5–15.5)
WBC: 9.5 10*3/uL (ref 4.0–10.5)

## 2012-07-05 MED ORDER — BIOTENE DRY MOUTH MT LIQD: 15.0000 mL | OROMUCOSAL | Status: DC | PRN

## 2012-07-05 MED ORDER — LIDOCAINE VISCOUS 2 % MT SOLN
20.0000 mL | Freq: Four times a day (QID) | OROMUCOSAL | Status: DC | PRN
  Administered 2012-07-05 – 2012-07-06 (×2): 20 mL via OROMUCOSAL
  Filled 2012-07-05 (×2): qty 20

## 2012-07-05 MED ORDER — BACITRACIN-NEOMYCIN-POLYMYXIN OINTMENT TUBE
TOPICAL_OINTMENT | Freq: Every day | CUTANEOUS | Status: DC
  Administered 2012-07-05 – 2012-07-06 (×2): via TOPICAL
  Filled 2012-07-05: qty 15

## 2012-07-05 MED ORDER — AMLODIPINE BESYLATE 5 MG PO TABS
5.0000 mg | ORAL_TABLET | Freq: Every day | ORAL | Status: DC
  Administered 2012-07-06: 5 mg via ORAL
  Filled 2012-07-05: qty 1

## 2012-07-05 MED ORDER — FLUTICASONE PROPIONATE 50 MCG/ACT NA SUSP
1.0000 | Freq: Every day | NASAL | Status: DC
  Administered 2012-07-05 – 2012-07-06 (×2): 1 via NASAL
  Filled 2012-07-05: qty 16

## 2012-07-05 NOTE — Progress Notes (Signed)
Clinical Social Work Department BRIEF PSYCHOSOCIAL ASSESSMENT 07/05/2012  Patient:  MAILIN, COGLIANESE     Account Number:  [id number]     Admit date:  07/02/2012  Clinical Social Worker:  Orpah Greek  Date/Time:  07/05/2012 01:31 PM  Referred by:  Physician  Date Referred:  07/05/2012 Referred for  SNF Placement   Other Referral:   Interview type:  Patient Other interview type:   and son at bedside, Windy Fast    PSYCHOSOCIAL DATA Living Status:  HUSBAND Admitted from facility:   Level of care:   Primary support name:  [name] (son) h#: [phone] c#: [phone] Primary support relationship to patient:  CHILD, ADULT Degree of support available:   good    CURRENT CONCERNS Current Concerns  Post-Acute Placement   Other Concerns:    SOCIAL WORK ASSESSMENT / PLAN CSW spoke with patient & son at bedside, Windy Fast re: discharge planning. Patient was admitted from home with her husband, Gardiner Rhyme (who is currently admitted to ICU/Stepdown and will also be needing SNF placement). PT recommended SNF at discharge.   Assessment/plan status:  Information/Referral to [facility] Other assessment/ plan:   Information/referral to community resources:   CSW completed FL2 and faxed information out to [facility] - provided bed offers to patient.    PATIENT'S/FAMILY'S RESPONSE TO PLAN OF CARE: Patient had expressed interest in [facility] - CSW confirmed with Tammie that they would be able to offer a bed, anticipating discharge tomorrow.        Unice Bailey, LCSW [facility] Clinical Social Worker cell #: [phone]

## 2012-07-05 NOTE — Progress Notes (Signed)
  Echocardiogram 2D Echocardiogram has been performed.  Jalani Rominger, [facility] 07/05/2012, 11:51 AM

## 2012-07-05 NOTE — Progress Notes (Signed)
Spoke with patient at bedside to explain [facility] Care Management services by way of being a COPD Gold patient. [name] was appreciative of visit. However, she states her plan is to go to [facility] upon discharge. She reports that she can not return home. States her husband is sick in the hospital as well. Therefore, she does not want to participate in [facility] Care Management because she does not plan on returning home and does not feel she needs [facility] Care Management services at this time.  Raiford Noble, MSN-ED, RN,BSN [facility] Liaison [phone]

## 2012-07-05 NOTE — Care Management Note (Signed)
    Page 1 of 1   07/05/2012     1:32:25 PM   CARE MANAGEMENT NOTE 07/05/2012  Patient:  [name], [name]   Account Number:  [id number]  Date Initiated:  07/05/2012  Documentation initiated by:  Colleen Can  Subjective/Objective Assessment:   DX copd excerbation     Action/Plan:   SNF   Anticipated DC Date:  07/06/2012   Anticipated DC Plan:  SKILLED NURSING FACILITY  In-house referral  Clinical Social Worker      DC Planning Services  CM consult      Choice offered to / List presented to:  NA           Status of service:  Completed, signed off Medicare Important Message given?  NA - LOS <3 / Initial given by admissions (If response is "NO", the following Medicare IM given date fields will be blank) Date Medicare IM given:   Date Additional Medicare IM given:    Discharge Disposition:    Per UR Regulation:    If discussed at Long Length of Stay Meetings, dates discussed:    Comments:

## 2012-07-05 NOTE — Progress Notes (Signed)
Chaplain saw pt on rounds.   Providing emotional/spiritual support and grief work for this pt.  Pt's spouse also admitted to hospital.   Pt spoke with chaplain about feeling overwhelmed by illness the last few months.  Spoke about anxiety of feeling short of breath.  Tearful around not being able to discharge home, but feels [facility] is best fit for her and husband.  Pt hopeful that she will not suffer as her illness progresses and hopeful that her family will not suffer.  Chaplain provided grief support around loss in ability and anticipating death.  Spoke with chaplain about having communicated her desire to "be in a more peaceful place" with family.   Pt supported by sister, children, spouse.    Will continue to follow and provide support.  Please page as needs arise.    Belva Crome MDiv

## 2012-07-05 NOTE — Progress Notes (Signed)
[facility] PROGRESS NOTE  [name] XBM:[id number] DOB: 04-10-1931 DOA: 07/02/2012 PCP: [name], MD  Assessment/Plan: Acute on chronic respiratory failure due to COPD exacerbation, mildly more dyspneic than baseline.  [name] of breath with speaking.  Patient had questions about treatments which may make her less [name] of breath than she currently is.  Advised her to speak to Dr. Delford Field about what treatment modalities are available to her.  She is already on LABA with SABA prn, ICS, ACH, O2.   -  Offered palliative treatments such as increased benzodiazepine and morphine IR -  Advised patient that she will likely only feel marginally better than she currently does -  Acute onset URI symptoms -  Does have GERD symptoms:  Maximize PPI -  Continue IV steroids, neb treatments, pulmocort -  Continue oxygen at 3L -  Walking pulse oximetry -  ECHO demonstrates preserved RV fx -  Continue lasix at current dose  Hypertension, labile blood pressures, may be due to anxiety from dyspnea -  continue losartan  -  Add norvasc -  Continue prn hydralazine  Anemia, Hemoglobin has fluctuated but appears to be stable around 10.   Hyperlipidemia, Continue simvastatin.  Anxiety Continue Xanax Continue morphine for dyspnea, however, patient has not received any doses  Code Status: DNR Family Communication: Family, including two sons and daughter, updated with plan of care. Disposition Plan:  Pending improvement in breathing, to SNF    Consultants:  None  Procedures:  None  Antibiotics:  Levofloxacin 1/18 >>   HPI/Subjective: [name] of breath and wheezing, about the same as yesterday and near baseline.  Denies chest pain, N/V/D/C.    Objective: Filed Vitals:   07/05/12 1552 07/05/12 1807 07/05/12 2119 07/05/12 2210  BP:  177/96  150/78  Pulse:  104  117  Temp:  98.2 F (36.8 C)  97.7 F (36.5 C)  TempSrc:  Oral  Oral  Resp:  20  19  Height:      Weight:        SpO2: 94% 97% 97% 96%    Intake/Output Summary (Last 24 hours) at 07/05/12 2229 Last data filed at 07/05/12 2148  Gross per 24 hour  Intake    795 ml  Output   1175 ml  Net   -380 ml   Filed Weights   07/03/12 0019 07/04/12 0459 07/05/12 0425  Weight: 60.7 kg (133 lb 13.1 oz) 61.4 kg (135 lb 5.8 oz) 60.056 kg (132 lb 6.4 oz)    Exam: Physical Exam: General: Awake, Oriented, mild to moderate respiratory distress with SCM, subcostal retractions.  Able to speak in [name] sentences.   HEENT: MMM. CV: Tachycardic, regular rhythm, normal S1 and S2, 1/6 murmur at LSB, no rubs or gallops Lungs:  Diminished BS at bases with moderate pitched full expiratory wheezing bilaterally, no rales or rhonchi.  Abdomen: Soft, Nontender, Nondistended, +bowel sounds. Ext: 2+ pulses. No edema.  Data Reviewed: Basic Metabolic Panel:  Lab 07/04/12 0272 07/03/12 0630 07/02/12 1955  NA 139 138 144  K 4.3 4.8 2.0*  CL 105 103 117*  CO2 27 26 17*  GLUCOSE 155* 147* 83  BUN 23 14 11   CREATININE 0.92 0.70 0.39*  CALCIUM 9.6 10.2 5.2*  MG 2.1 -- --  PHOS -- -- --   Liver Function Tests:  Lab 07/03/12 0630 07/02/12 1955  AST 21 17  ALT 18 13  ALKPHOS 80 44  BILITOT 0.2* 0.1*  PROT 6.4 3.6*  ALBUMIN 3.4* 1.9*   No results found for this basename: LIPASE:5,AMYLASE:5 in the last 168 hours No results found for this basename: AMMONIA:5 in the last 168 hours CBC:  Lab 07/05/12 0430 07/04/12 0445 07/03/12 0630 07/02/12 1955  WBC 9.5 6.3 5.8 6.0  NEUTROABS -- -- -- --  HGB 10.9* 10.0* 11.0* 9.1*  HCT 35.0* 32.3* 35.3* 29.2*  MCV 91.6 91.5 91.2 90.4  PLT 240 217 228 223   Cardiac Enzymes:  Lab 07/02/12 2011  CKTOTAL --  CKMB --  CKMBINDEX --  TROPONINI <0.30   BNP (last 3 results)  Basename 07/03/12 0630 05/21/12 0506  PROBNP 674.3* 383.1   CBG: No results found for this basename: GLUCAP:5 in the last 168 hours  No results found for this or any previous visit (from the past 240  hour(s)).   Studies: No results found.  Scheduled Meds:    . ipratropium  0.5 mg Nebulization Q4H   And  . albuterol  2.5 mg Nebulization Q4H  . ALPRAZolam  0.5 mg Oral TID  . atorvastatin  20 mg Oral q1800  . budesonide  0.25 mg Nebulization BID  . docusate sodium  100 mg Oral BID  . enoxaparin (LOVENOX) injection  40 mg Subcutaneous Q24H  . fluticasone  1 spray Each Nare Daily  . furosemide  20 mg Oral BID  . levofloxacin  500 mg Oral Daily  . losartan  100 mg Oral Daily  . methylPREDNISolone (SOLU-MEDROL) injection  60 mg Intravenous Q12H  . neomycin-bacitracin-polymyxin   Topical Daily  . oxymetazoline  1 spray Each Nare BID  . pantoprazole  40 mg Oral BID  . sodium chloride  3 mL Intravenous Q12H   Continuous Infusions:    . sodium chloride 10 mL/hr at 07/03/12 0135    Principal Problem:  *COPD exacerbation Active Problems:  HYPERTENSION  Chronic respiratory failure  Hypocalcemia  Hypokalemia  Hypoalbuminemia  Anemia   [name]  [facility] Pager [phone]. If 7PM-7AM, please contact night-coverage at [email or web address], password [facility] 07/05/2012, 10:29 PM  LOS: 3 days

## 2012-07-05 NOTE — Progress Notes (Signed)
Utilization review completed.  

## 2012-07-05 NOTE — Progress Notes (Signed)
Patient has a bed at [facility] - anticipating discharge tomorrow.     Clinical Social Work Department CLINICAL SOCIAL WORK PLACEMENT NOTE 07/05/2012  Patient:  [name], [name]  Account Number:  [id number] Admit date:  07/02/2012  Clinical Social Worker:  Orpah Greek  Date/time:  07/05/2012 01:35 PM  Clinical Social Work is seeking post-discharge placement for this patient at the following level of care:   SKILLED NURSING   (*CSW will update this form in Epic as items are completed)   07/05/2012  Patient/family provided with [facility] Department of Clinical Social Work's list of facilities offering this level of care within the geographic area requested by the patient (or if unable, by the patient's family).  07/05/2012  Patient/family informed of their freedom to choose among providers that offer the needed level of care, that participate in Medicare, Medicaid or managed care program needed by the patient, have an available bed and are willing to accept the patient.  07/05/2012  Patient/family informed of [facility]' ownership interest in [facility], as well as of the fact that they are under no obligation to receive care at this facility.  PASARR submitted to EDS on 07/05/2012 PASARR number received from EDS on 07/05/2012  FL2 transmitted to all facilities in geographic area requested by pt/family on  07/05/2012 FL2 transmitted to all facilities within larger geographic area on   Patient informed that his/her managed care company has contracts with or will negotiate with  certain facilities, including the following:     Patient/family informed of bed offers received:  07/05/2012 Patient chooses bed at [facility] Physician recommends and patient chooses bed at    Patient to be transferred to [facility] on   Patient to be transferred to facility by   The following physician request were entered in Epic:   Additional Comments:  Unice Bailey,  LCSW [facility] Clinical Social Worker cell #: [phone]

## 2012-07-06 MED ORDER — SALINE SPRAY 0.65 % NA SOLN: 1.0000 | NASAL | Status: DC | PRN

## 2012-07-06 MED ORDER — ALPRAZOLAM 0.5 MG PO TABS: 0.5000 mg | ORAL_TABLET | Freq: Three times a day (TID) | ORAL | Status: DC | PRN

## 2012-07-06 MED ORDER — FORMOTEROL FUMARATE 12 MCG IN CAPS: 12.0000 ug | ORAL_CAPSULE | Freq: Two times a day (BID) | RESPIRATORY_TRACT | Status: DC

## 2012-07-06 MED ORDER — PREDNISONE 10 MG PO TABS: ORAL_TABLET | ORAL | Status: DC

## 2012-07-06 MED ORDER — FUROSEMIDE 20 MG PO TABS: 20.0000 mg | ORAL_TABLET | Freq: Every day | ORAL | Status: DC

## 2012-07-06 MED ORDER — ACETAMINOPHEN 325 MG PO TABS: 650.0000 mg | ORAL_TABLET | Freq: Four times a day (QID) | ORAL | Status: DC | PRN

## 2012-07-06 MED ORDER — FLUTICASONE PROPIONATE 50 MCG/ACT NA SUSP: 1.0000 | Freq: Every day | NASAL | Status: DC

## 2012-07-06 MED ORDER — TIOTROPIUM BROMIDE MONOHYDRATE 18 MCG IN CAPS: 18.0000 ug | ORAL_CAPSULE | Freq: Every day | RESPIRATORY_TRACT | Status: DC

## 2012-07-06 MED ORDER — BIOTENE DRY MOUTH MT LIQD: 15.0000 mL | OROMUCOSAL | Status: DC | PRN

## 2012-07-06 MED ORDER — NYSTATIN 100000 UNIT/ML MT SUSP: 500000.0000 [IU] | Freq: Four times a day (QID) | OROMUCOSAL | Status: DC

## 2012-07-06 MED ORDER — HYDROCOD POLST-CHLORPHEN POLST 10-8 MG/5ML PO LQCR: 5.0000 mL | Freq: Two times a day (BID) | ORAL | Status: DC | PRN

## 2012-07-06 MED ORDER — AMLODIPINE BESYLATE 5 MG PO TABS: 5.0000 mg | ORAL_TABLET | Freq: Every day | ORAL | Status: DC

## 2012-07-06 MED ORDER — BUDESONIDE 0.25 MG/2ML IN SUSP: 0.2500 mg | Freq: Every day | RESPIRATORY_TRACT | Status: DC

## 2012-07-06 MED ORDER — MAGIC MOUTHWASH W/LIDOCAINE: 5.0000 mL | Freq: Four times a day (QID) | ORAL | Status: DC | PRN

## 2012-07-06 MED ORDER — TIOTROPIUM BROMIDE MONOHYDRATE 18 MCG IN CAPS
18.0000 ug | ORAL_CAPSULE | Freq: Every day | RESPIRATORY_TRACT | Status: DC
  Filled 2012-07-06: qty 5

## 2012-07-06 MED ORDER — MORPHINE SULFATE 15 MG PO TABS: 15.0000 mg | ORAL_TABLET | ORAL | Status: DC | PRN

## 2012-07-06 MED ORDER — BACITRACIN-NEOMYCIN-POLYMYXIN OINTMENT TUBE: 1.0000 "application " | TOPICAL_OINTMENT | Freq: Every day | CUTANEOUS | Status: DC

## 2012-07-06 MED ORDER — BENZONATATE 200 MG PO CAPS: 200.0000 mg | ORAL_CAPSULE | Freq: Three times a day (TID) | ORAL | Status: DC | PRN

## 2012-07-06 MED ORDER — ALBUTEROL SULFATE (2.5 MG/3ML) 0.083% IN NEBU: 2.5000 mg | INHALATION_SOLUTION | RESPIRATORY_TRACT | Status: DC | PRN

## 2012-07-06 MED ORDER — OXYMETAZOLINE HCL 0.05 % NA SOLN: 1.0000 | Freq: Two times a day (BID) | NASAL | Status: DC

## 2012-07-06 NOTE — Discharge Summary (Addendum)
Physician Discharge Summary  [name] ZOX:[id number] DOB: [birth year] DOA: 07/02/2012  PCP: [name], MD  Admit date: 07/02/2012 Discharge date: 07/06/2012  Recommendations for Outpatient Follow-up:  1. Follow up with Dr. Delford Field at already scheduled appointment 2. Follow up with primary care doctor in 2 weeks for follow up of hospitalization and breathing problems.  Repeat BMP to follow up potassium after decreasing lasix.  Check blood pressure after starting norvasc.   3. May continue afrin twice daily as needed through 1/23, then stop 4. Continue dressings to the left arm once daily with neosporin until healed.   5. Prednisone taper to started on 1/22 with 40mg  x 2 days, then 30mg  x 2 days, then 20mg  x 2 days, then 10mg  x 2 days, then stop. 6. Pt completed oral antibiotics as inpatient 7. Continue 3L [place]   Discharge Diagnoses:  Principal Problem:  *COPD exacerbation Active Problems:  HYPERTENSION  Chronic respiratory failure  Hypocalcemia  Hypokalemia  Hypoalbuminemia  Anemia   Discharge Condition: stable, improved  Diet recommendation: healthy heart  Wt Readings from Last 3 Encounters:  07/06/12 59.421 kg (131 lb)  06/30/12 58.968 kg (130 lb)  06/24/12 62.143 kg (137 lb)    History of present illness:   [name] is a 79 y.o. female with O2 dependent COPD who presents to the ED with complaints of worsening SOB over 2 weeks, but even worse over the past 2 days. She was seen by her pulmonologist Dr. Delford Field and was administered prednisone therapy and placed on Levaquin, 2 days ago. She report having a cough productive of white mucous, and denies having fevers or chills. She does also report having nausea, but no vomiting, and 1 loose stool. She also has complaints of edema in both of her lower legs. In the ED she was found to have hypokalemia, and hypocalcemia, and was referred for admission for a COPD Exacerbation.    Hospital Course:   Acute on chronic respiratory  failure due to COPD exacerbation.  Ms. Morandi was started on solumedrol, duonebs, and continued of levofloxacin.  She continued cough medication and pulmicort.  She quickly weaned from 4L to 3L nasal canula and over the following 2 days, she was able to ambulate a Mliss Wedin distance to the bedside commode without significant dyspnea.  She was started on spiriva and formoterol at discharge in addition to her pulmicort to optimize her COPD management.  She was also given a prescription for morphine for dyspnea, which helped her breathing very much.  She should complete a steroid taper as above.  She had started a course of levofloxacin prior to admission and she completed her course while in the hospital.  She had some sinus symptoms that improved with afrin, nasal saline, and flonase.  She developed thrush from her pulmicort and steroids and she was given nystatin.  Please continue nystatin for 2-3 days after resolution of her thrush symptoms.  She may use magic mouthwash in the meantime.  She had an ECHO to rule out right heart failure or CHF/diastolic heart failure and her heart function was normal.  Her lasix was decreased to once daily because she was frustrated with voiding frequently overnight.    Hypertension, elevated blood pressures:  She continued losartan and norvasc was added to her regimen.  Her PCP may follow up with blood pressure check in 2 weeks.   Sinus tachycardia:  Likely side effect of SABA and steroids and deconditioning.  Stable in the low 100s.  Rises with exertion.   Anemia, Hemoglobin has fluctuated but appears to be stable around 10.  Further evaluation per PCP. Hyperlipidemia, Continued simvastatin.  Anxiety Continued Xanax.    Consultants:  None Procedures:  None Antibiotics:  Levofloxacin 1/18 >> 1/21, then stop  Discharge Exam: Filed Vitals:   07/06/12 0607  BP: 152/72  Pulse: 90  Temp: 98.1 F (36.7 C)  Resp: 19   Filed Vitals:   07/06/12 0351 07/06/12 0447 07/06/12  0607 07/06/12 0906  BP:   152/72   Pulse:   90   Temp:   98.1 F (36.7 C)   TempSrc:   Oral   Resp:   19   Height:      Weight:  59.421 kg (131 lb)    SpO2: 98%  99% 94%    General: Awake, Oriented, mild respiratory distress.  Able to speak in sentences and no longer needs to rest frequently to catch her breath.  HEENT: MMM.  CV: Tachycardic, regular rhythm, normal S1 and S2, 1/6 murmur at LSB, no rubs or gallops  Lungs: Improved but mildly diminished BS at bases with moderate pitched full expiratory wheezing bilaterally, no rales or rhonchi.  Abdomen: Soft, Nontender, Nondistended, +bowel sounds.  Ext: 2+ pulses. No edema.   Discharge Instructions      Discharge Orders    Future Appointments: Provider: Department: Dept Phone: Center:   08/27/2012 11:30 AM Storm Frisk, MD [facility] [phone] None   03/01/2013 11:45 AM [name], MD [facility] [phone] [facility]     Future Orders Please Complete By Expires   Diet - low sodium heart healthy      Increase activity slowly      Discharge instructions      Comments:   You were hospitalized with difficulty breathing due to COPD exacerbation.  You were started on steroids and nebulizer treatments.  You continued antibiotics which you had already been taking and you completed your full course of antibiotics in the hospital.  You should continue pulmicort.  I will add spiriva and long acting bronchodilator to help your breathing in addition to a new prednisone taper.  You were also started on morphine as needed for pain or shortness of breath.  Please follow up with Dr. Delford Field.  I will decrease your lasix slightly because your ultrasound of your heart did not show signs of right heart strain or heart failure.  For your thrush, please wash your mouth out well or brush your teeth after taking pulmicort.  Please use nystatin to treat your thrush.  Continue using it as prescribed for at least 2  days after your symptoms resolve.  Magic mouthwash may help with your mouth pain in the meantime.   Discharge wound care:      Comments:   Please continue neosporin with dry dressing to the left arm once daily until healed.   Call MD for:  temperature >100.4      Call MD for:  persistant nausea and vomiting      Call MD for:  severe uncontrolled pain      Call MD for:  difficulty breathing, headache or visual disturbances      Call MD for:  hives      Call MD for:  persistant dizziness or light-headedness      Call MD for:  extreme fatigue          Medication List     As of 07/06/2012  11:52 AM    STOP taking these medications         albuterol (5 MG/ML) 0.5% nebulizer solution   Commonly known as: PROVENTIL      HYDROcodone-acetaminophen 5-500 MG per tablet   Commonly known as: VICODIN      levofloxacin 500 MG tablet   Commonly known as: LEVAQUIN      TAKE these medications         acetaminophen 325 MG tablet   Commonly known as: TYLENOL   Take 2 tablets (650 mg total) by mouth every 6 (six) hours as needed (or Fever >/= 101).      AEROCHAMBER MV inhaler   Use as instructed      albuterol (2.5 MG/3ML) 0.083% nebulizer solution   Commonly known as: PROVENTIL   Take 3 mLs (2.5 mg total) by nebulization every 4 (four) hours as needed for wheezing or shortness of breath.      ALPRAZolam 0.5 MG tablet   Commonly known as: XANAX   Take 1 tablet (0.5 mg total) by mouth 3 (three) times daily as needed for anxiety. For anxiety and sleep      amLODipine 5 MG tablet   Commonly known as: NORVASC   Take 1 tablet (5 mg total) by mouth daily.      antiseptic oral rinse Liqd   15 mLs by Mouth Rinse route as needed.      benzonatate 200 MG capsule   Commonly known as: TESSALON   Take 1 capsule (200 mg total) by mouth 3 (three) times daily as needed for cough.      budesonide 0.25 MG/2ML nebulizer solution   Commonly known as: PULMICORT   Take 2 mLs (0.25 mg total) by  nebulization daily.      chlorpheniramine-HYDROcodone 10-8 MG/5ML Lqcr   Commonly known as: TUSSIONEX   Take 5 mLs by mouth every 12 (twelve) hours as needed.      DSS 100 MG Caps   Take 100 mg by mouth 2 (two) times daily.      esomeprazole 40 MG capsule   Commonly known as: NEXIUM   Take 40 mg by mouth daily.      fluticasone 50 MCG/ACT nasal spray   Commonly known as: FLONASE   Place 1 spray into the nose daily.      formoterol 12 MCG capsule for inhaler   Commonly known as: FORADIL   Place 1 capsule (12 mcg total) into inhaler and inhale 2 (two) times daily.      furosemide 20 MG tablet   Commonly known as: LASIX   Take 1 tablet (20 mg total) by mouth daily.      GARLIQUE 400 MG Tbec   Generic drug: Garlic   Take 1 tablet by mouth daily.      losartan 100 MG tablet   Commonly known as: COZAAR   Take 100 mg by mouth daily.      magic mouthwash w/lidocaine Soln   Take 5 mLs by mouth 4 (four) times daily as needed (pain).      morphine 15 MG tablet   Commonly known as: MSIR   Take 1 tablet (15 mg total) by mouth every 4 (four) hours as needed for pain (dyspnea).      neomycin-bacitracin-polymyxin Oint   Commonly known as: NEOSPORIN   Apply 1 application topically daily.      nystatin 100000 UNIT/ML suspension   Commonly known as: MYCOSTATIN   Take 5 mLs (500,000 Units  total) by mouth 4 (four) times daily.      oxymetazoline 0.05 % nasal spray   Commonly known as: AFRIN   Place 1 spray into the nose 2 (two) times daily.      potassium chloride 10 MEQ CR capsule   Commonly known as: MICRO-K   Take 10 mEq by mouth daily as needed.      predniSONE 10 MG tablet   Commonly known as: DELTASONE   Take 4 for two days three for two days two for two days one for two days, then stop      rosuvastatin 10 MG tablet   Commonly known as: CRESTOR   Take 10 mg by mouth daily.      sodium chloride 0.65 % Soln nasal spray   Commonly known as: OCEAN   Place 1 spray into  the nose as needed for congestion.        Follow-up Information    Follow up with [name], MD. Schedule an appointment as soon as possible for a visit in 2 weeks. (f/u COPD exacerbation)    Contact information:   [address] [place] [address] [place] [address] [phone]       Follow up with Shan Levans, MD. (already schedule appointment)    Contact information:   [address] [place] [address] [phone]           The results of significant diagnostics from this hospitalization (including imaging, microbiology, ancillary and laboratory) are listed below for reference.    Significant Diagnostic Studies: Dg Chest 2 View (if Patient Has Fever And/or Copd)  07/02/2012  *RADIOLOGY REPORT*  Clinical Data: Shortness of breath and cough  CHEST - 2 VIEW  Comparison: 12/05/2013and CT abdomen pelvis 06/29/2009  Findings: Focal eventration of the posterior hemidiaphragms bilaterally, chronic.  Heart size normal. Tortuous and atherosclerotic thoracic aorta is stable.  The lungs are hyperinflated and demonstrate probable emphysematous changes.  No focal airspace disease, effusion, or pneumothorax. Left shoulder hemiarthroplasty.  Diffuse osteopenia.  IMPRESSION: Emphysema/COPD with hyperinflation. No acute findings identified.   Original Report Authenticated By: Britta Mccreedy, M.D.     Microbiology: No results found for this or any previous visit (from the past 240 hour(s)).   Labs: Basic Metabolic Panel:  Lab 07/04/12 5784 07/03/12 0630 07/02/12 1955  NA 139 138 144  K 4.3 4.8 2.0*  CL 105 103 117*  CO2 27 26 17*  GLUCOSE 155* 147* 83  BUN 23 14 11   CREATININE 0.92 0.70 0.39*  CALCIUM 9.6 10.2 5.2*  MG 2.1 -- --  PHOS -- -- --   Liver Function Tests:  Lab 07/03/12 0630 07/02/12 1955  AST 21 17  ALT 18 13  ALKPHOS 80 44  BILITOT 0.2* 0.1*  PROT 6.4 3.6*  ALBUMIN 3.4* 1.9*   No results found for this basename:  LIPASE:5,AMYLASE:5 in the last 168 hours No results found for this basename: AMMONIA:5 in the last 168 hours CBC:  Lab 07/05/12 0430 07/04/12 0445 07/03/12 0630 07/02/12 1955  WBC 9.5 6.3 5.8 6.0  NEUTROABS -- -- -- --  HGB 10.9* 10.0* 11.0* 9.1*  HCT 35.0* 32.3* 35.3* 29.2*  MCV 91.6 91.5 91.2 90.4  PLT 240 217 228 223   Cardiac Enzymes:  Lab 07/02/12 2011  CKTOTAL --  CKMB --  CKMBINDEX --  TROPONINI <0.30   BNP: BNP (last 3 results)  Basename 07/03/12 0630 05/21/12 6962  PROBNP 674.3* 383.1   CBG: No results found for this basename: GLUCAP:5 in the last 168 hours  Time coordinating discharge: 45 minutes  Signed:  Evann Erazo  [facility] 07/06/2012, 11:52 AM

## 2012-07-06 NOTE — Progress Notes (Signed)
Pt went to ICU to visit her husband, back to room now and EMS called for transport to [facility].

## 2012-07-06 NOTE — Progress Notes (Signed)
Patient had 3 PVCs in a row at 0355. Patient has been asymptomatic and stable entire time. Patient's normal rhythm is low 100s.

## 2012-07-06 NOTE — Progress Notes (Signed)
Patient is set to discharge to [facility] today. Son, Windy Fast to complete admission paperwork at bedside with Paula Compton from SNF at 1:00p. CSW will call for [facility] to transport her when paperwork is complete. Discharge packet in [place] with FL2, DNR, AVS, prescriptions & chart copy.   Clinical Social Work Department CLINICAL SOCIAL WORK PLACEMENT NOTE 07/06/2012  Patient:  ARYIAH, MONTEROSSO  Account Number:  [id number] Admit date:  07/02/2012  Clinical Social Worker:  Orpah Greek  Date/time:  07/05/2012 01:35 PM  Clinical Social Work is seeking post-discharge placement for this patient at the following level of care:   SKILLED NURSING   (*CSW will update this form in Epic as items are completed)   07/05/2012  Patient/family provided with [facility] Department of Clinical Social Work's list of facilities offering this level of care within the geographic area requested by the patient (or if unable, by the patient's family).  07/05/2012  Patient/family informed of their freedom to choose among providers that offer the needed level of care, that participate in Medicare, Medicaid or managed care program needed by the patient, have an available bed and are willing to accept the patient.  07/05/2012  Patient/family informed of [facility]' ownership interest in [facility], as well as of the fact that they are under no obligation to receive care at this facility.  PASARR submitted to EDS on 07/05/2012 PASARR number received from EDS on 07/05/2012  FL2 transmitted to all facilities in geographic area requested by pt/family on  07/05/2012 FL2 transmitted to all facilities within larger geographic area on   Patient informed that his/her managed care company has contracts with or will negotiate with  certain facilities, including the following:     Patient/family informed of bed offers received:  07/05/2012 Patient chooses bed at [facility] Physician recommends and patient  chooses bed at    Patient to be transferred to [facility] on  07/06/2012 Patient to be transferred to facility by [facility]  The following physician request were entered in Epic:   Additional Comments:  Unice Bailey, LCSW [facility] Clinical Social Worker cell #: [phone]

## 2012-07-06 NOTE — Progress Notes (Signed)
Provided brief continued support with pt.  Worked with pt around possibility of going downstairs to see husband in ICU.   [name]

## 2012-07-06 NOTE — Progress Notes (Signed)
Report called to [facility] 

## 2012-08-03 ENCOUNTER — Other Ambulatory Visit: Payer: Self-pay | Admitting: Family Medicine

## 2012-08-11 ENCOUNTER — Ambulatory Visit (INDEPENDENT_AMBULATORY_CARE_PROVIDER_SITE_OTHER): Payer: Medicare Other | Admitting: Adult Health

## 2012-08-11 ENCOUNTER — Ambulatory Visit (INDEPENDENT_AMBULATORY_CARE_PROVIDER_SITE_OTHER)
Admission: RE | Admit: 2012-08-11 | Discharge: 2012-08-11 | Disposition: A | Discharge status: Home / Self Care | Payer: Medicare Other | Source: Ambulatory Visit | Attending: Adult Health | Admitting: Adult Health

## 2012-08-11 VITALS — BP 96/56 | HR 75 | Temp 97.5°F | Ht 60.0 in | Wt 129.0 lb

## 2012-08-11 DIAGNOSIS — J449 Chronic obstructive pulmonary disease, unspecified: Secondary | ICD-10-CM

## 2012-08-11 DIAGNOSIS — J189 Pneumonia, unspecified organism: Secondary | ICD-10-CM

## 2012-08-11 MED ORDER — ONDANSETRON HCL 4 MG PO TABS: 4.0000 mg | ORAL_TABLET | Freq: Three times a day (TID) | ORAL | Status: DC | PRN

## 2012-08-11 MED ORDER — SIMETHICONE 80 MG PO CHEW: 80.0000 mg | CHEWABLE_TABLET | ORAL | Status: DC | PRN

## 2012-08-11 NOTE — Patient Instructions (Addendum)
Stop Phenergan .  May use Zofran 4mg  Three times a day  As needed  Nausea Gas X As needed   Stop Levaquin  Stop Norvasc .  Follow up Dr. Delford Field  In 2 weeks with chest xray  Please contact office for sooner follow up if symptoms do not improve or worsen or seek emergency care

## 2012-08-13 ENCOUNTER — Encounter: Payer: Self-pay | Admitting: Adult Health

## 2012-08-13 DIAGNOSIS — J189 Pneumonia, unspecified organism: Secondary | ICD-10-CM | POA: Insufficient documentation

## 2012-08-13 NOTE — Assessment & Plan Note (Addendum)
Resolving PNA -w/ intolerance to abx -GI complaints  Clinically she is stable- will watch closely for now  Stop abx to see if helps her GI system.  She has multiple abx and drug intolerances    Plan  Stop Phenergan .  May use Zofran 4mg  Three times a day  As needed  Nausea Gas X As needed   Stop Levaquin  Stop Norvasc .  Follow up Dr. Delford Field  In 2 weeks with chest xray  Please contact office for sooner follow up if symptoms do not improve or worsen or seek emergency care

## 2012-08-13 NOTE — Progress Notes (Signed)
Subjective:    Patient ID: [name], female    DOB: [birth year], 79 y.o.   MRN: [id number]  HPI 79 y.o. . WF Copd IV Golds. Oxygen dependent.  Adm 2/13 , 11/13 , and 05/20/12   06/30/2012 Pt is worse over time.  If does any activity will get severly dyspneic. Pulmicort was helping but insurance will not cover  08/11/12 Acute OV  Dx w/ PNA w/ LLL opacity . Started on Levaquin on day 5/7 .  Breathing and cough are getting better. But abx are causing her severe nausea , no appetite , gas and bloating.  CXR today shows LLL atx vs developing aspdz.  No fever. No hemoptyiss or edema.  Doing well on O2 w/ good sats.     Past Medical History  Diagnosis Date  . History of colonic polyps   . COPD (chronic obstructive pulmonary disease)   . Diverticulitis of colon   . GERD (gastroesophageal reflux disease)   . HTN (hypertension)   . Osteoarthritis   . Hyperlipemia   . Aortic aneurysm, abdominal   . Ovarian cyst, left   . Psoriasis   . Chronic respiratory failure     Uses home oxygen at 2 L  . ADRENAL MASS 05/04/2007  . GLAUCOMA 03/30/2007  . HEMORRHOIDS, INTERNAL 03/23/2008  . OSTEOPOROSIS 03/30/2007  . SCOLIOSIS 03/30/2007  . PONV (postoperative nausea and vomiting)       Review of Systems  Constitutional:   No  weight loss, night sweats,  Fevers, chills,  +fatigue, or  lassitude.  HEENT:   No headaches,  Difficulty swallowing,  Tooth/dental problems, or  Sore throat,                No sneezing, itching, ear ache, nasal congestion, post nasal drip,   CV:  No chest pain,  Orthopnea, PND, swelling in lower extremities, anasarca, dizziness, palpitations, syncope.   GI  No bloody stools.   Resp:   No coughing up of blood.  [name]  No chest wall deformity  Skin: no rash or lesions.  GU: no dysuria, change in color of urine, no urgency or frequency.  No flank pain, no hematuria   MS:  No joint pain or swelling.  No decreased range of motion.  No back pain.  Psych:  No change  in mood or affect. No depression or anxiety.  No memory loss.      Objective:   Physical Exam BP 96/56  Pulse 75  Temp(Src) 97.5 F (36.4 C) (Oral)  Ht 5' (1.524 m)  Wt 129 lb (58.514 kg)  BMI 25.19 kg/m2  SpO2 97%  Gen. Pleasant, elderly on o2 in no distress, normal affect ENT - no lesions, no post nasal drip Neck: No JVD, no thyromegaly, no carotid bruits Lungs: no use of accessory muscles, no dullness to percussion, CTA , diminished BS in bases , exp wheezes Cardiovascular: Rhythm regular, heart sounds  normal, no murmurs or gallops, no peripheral edema Abdomen: soft and non-tender, no hepatosplenomegaly, BS normal. Musculoskeletal: No deformities, no cyanosis or clubbing Neuro:  alert, non focal Skin : scattered vesicles along medial buttock         Assessment & Plan:   Pneumonia Resolving PNA -w/ intolerance to abx -GI complaints  Clinically she is stable- will watch closely for now  Stop abx to see if helps her GI system.    Plan  Stop Phenergan .  May use Zofran 4mg  Three times a day  As  needed  Nausea Gas X As needed   Stop Levaquin  Stop Norvasc .  Follow up Dr. Delford Field  In 2 weeks with chest xray  Please contact office for sooner follow up if symptoms do not improve or worsen or seek emergency care       Updated Medication List Outpatient Encounter Prescriptions as of 08/11/2012  Medication Sig Dispense Refill  . acetaminophen (TYLENOL) 325 MG tablet Take 2 tablets (650 mg total) by mouth every 6 (six) hours as needed (or Fever >/= 101).      [name] albuterol (PROVENTIL) (2.5 MG/3ML) 0.083% nebulizer solution Take 3 mLs (2.5 mg total) by nebulization every 4 (four) hours as needed for wheezing or shortness of breath.  75 mL    . ALPRAZolam (XANAX) 0.5 MG tablet Take 1 tablet (0.5 mg total) by mouth 3 (three) times daily as needed for anxiety. For anxiety and sleep  30 tablet  0  . Alum & Mag Hydroxide-Simeth (MAGIC MOUTHWASH W/LIDOCAINE) SOLN Take 5 mLs by  mouth 4 (four) times daily as needed (pain).    0  . antiseptic oral rinse (BIOTENE) LIQD 15 mLs by Mouth Rinse route as needed.      [name] atorvastatin (LIPITOR) 40 MG tablet Take 40 mg by mouth daily.      . benzonatate (TESSALON) 200 MG capsule Take 1 capsule (200 mg total) by mouth 3 (three) times daily as needed for cough.  20 capsule  0  . budesonide (PULMICORT) 0.25 MG/2ML nebulizer solution Take 2 mLs (0.25 mg total) by nebulization daily.  60 mL    . chlorpheniramine-HYDROcodone (TUSSIONEX) 10-8 MG/5ML LQCR Take 5 mLs by mouth every 12 (twelve) hours as needed.  140 mL  0  . Docusate Sodium (DSS) 100 MG CAPS Take 100 mg by mouth 2 (two) times daily.      [name] esomeprazole (NEXIUM) 40 MG capsule Take 40 mg by mouth daily.       . fluticasone (FLONASE) 50 MCG/ACT nasal spray Place 1 spray into the nose daily.      . formoterol (FORADIL) 12 MCG capsule for inhaler Place 1 capsule (12 mcg total) into inhaler and inhale 2 (two) times daily.  60 capsule  12  . furosemide (LASIX) 20 MG tablet Take 1 tablet (20 mg total) by mouth daily.  30 tablet    . Garlic (GARLIQUE) 400 MG TBEC Take 1 tablet by mouth daily.       [name] losartan (COZAAR) 100 MG tablet Take 100 mg by mouth daily.      [name] morphine (MSIR) 15 MG tablet Take 1 tablet (15 mg total) by mouth every 4 (four) hours as needed for pain (dyspnea).  30 tablet  0  . neomycin-bacitracin-polymyxin (NEOSPORIN) OINT Apply 1 application topically daily.      [name] nystatin (MYCOSTATIN) 100000 UNIT/ML suspension Take 5 mLs (500,000 Units total) by mouth 4 (four) times daily.  60 mL  0  . omeprazole (PRILOSEC) 20 MG capsule Take 20 mg by mouth daily.      [name] oxymetazoline (AFRIN) 0.05 % nasal spray Place 1 spray into the nose 2 (two) times daily.  30 mL    . potassium chloride (MICRO-K) 10 MEQ CR capsule Take 10 mEq by mouth daily as needed.      . sodium chloride (OCEAN) 0.65 % SOLN nasal spray Place 1 spray into the nose as needed for congestion.      [name]  Spacer/Aero-Holding Chambers (AEROCHAMBER MV) inhaler Use  as instructed  1 each  0  . tiotropium (SPIRIVA) 18 MCG inhalation capsule Place 1 capsule (18 mcg total) into inhaler and inhale daily.  30 capsule    . [DISCONTINUED] amLODipine (NORVASC) 5 MG tablet Take 1 tablet (5 mg total) by mouth daily.      . ondansetron (ZOFRAN) 4 MG tablet Take 1 tablet (4 mg total) by mouth every 8 (eight) hours as needed for nausea.  30 tablet  0  . simethicone (GAS-X) 80 MG chewable tablet Chew 1 tablet (80 mg total) by mouth every 4 (four) hours as needed.  30 tablet  0  . [DISCONTINUED] furosemide (LASIX) 20 MG tablet TAKE 1 TABLET (20 MG TOTAL) BY MOUTH 2 (TWO) TIMES DAILY.  60 tablet  2  . [DISCONTINUED] predniSONE (DELTASONE) 10 MG tablet Take 4 for two days three for two days two for two days one for two days, then stop  20 tablet  0  . [DISCONTINUED] rosuvastatin (CRESTOR) 10 MG tablet Take 10 mg by mouth daily.       No facility-administered encounter medications on file as of 08/11/2012.

## 2012-08-27 ENCOUNTER — Ambulatory Visit (INDEPENDENT_AMBULATORY_CARE_PROVIDER_SITE_OTHER): Payer: Medicare Other | Admitting: Critical Care Medicine

## 2012-08-27 ENCOUNTER — Telehealth: Payer: Self-pay | Admitting: Critical Care Medicine

## 2012-08-27 ENCOUNTER — Encounter: Payer: Self-pay | Admitting: Critical Care Medicine

## 2012-08-27 VITALS — BP 98/66 | HR 90 | Temp 97.5°F | Ht 60.0 in | Wt 126.8 lb

## 2012-08-27 DIAGNOSIS — J449 Chronic obstructive pulmonary disease, unspecified: Secondary | ICD-10-CM

## 2012-08-27 DIAGNOSIS — J9610 Chronic respiratory failure, unspecified whether with hypoxia or hypercapnia: Secondary | ICD-10-CM

## 2012-08-27 NOTE — Telephone Encounter (Signed)
Error

## 2012-08-27 NOTE — Telephone Encounter (Signed)
Ok great.

## 2012-08-27 NOTE — Patient Instructions (Addendum)
D/c foradil Change albuterol neb to qid Change Morphine IR to 15 mg qid -- Please give in the morning, at lunch, mid afternoon, and qhs  Change xanax 1 mg to tid (in the morning, at lunch, and mid afternoon) and xanax 0.5 mg qhs Please given morphine IR and xanax together  Hospice referral placed

## 2012-08-27 NOTE — Progress Notes (Signed)
Subjective:    Patient ID: [name], female    DOB: 22-Jul-1930, 79 y.o.   MRN: [id number]  HPI  79 y.o. . WF Copd Gold D Oxygen dependent.  Adm 2/13 , 11/13 , and 05/20/12 , 06/2012 , 2/ 2014  06/30/2012 Pt is worse over time.  If does any activity will get severly dyspneic. Pulmicort was helping but insurance will not cover  08/11/12 Acute OV  Dx w/ PNA w/ LLL opacity . Started on Levaquin on day 5/7 .  Breathing and cough are getting better. But abx are causing her severe nausea , no appetite , gas and bloating.  CXR today shows LLL atx vs developing aspdz.  No fever. No hemoptyiss or edema.  Doing well on O2 w/ good sats.   08/27/2012 Pt seen 2/26 for LLL PNA.  Levquin stopped.  Had side effects from levaquin GI in nature. CXR 2/26 showed resolving LLL PNA NOw:   No cough now.  No real wheezing.  Pt notes while getting a bath will wheeze.  Pt tfr to SNF. Pt now in rehab.  Will transition to LTC at [facility].   Past Medical History  Diagnosis Date  . History of colonic polyps   . COPD (chronic obstructive pulmonary disease)   . Diverticulitis of colon   . GERD (gastroesophageal reflux disease)   . HTN (hypertension)   . Osteoarthritis   . Hyperlipemia   . Aortic aneurysm, abdominal   . Ovarian cyst, left   . Psoriasis   . Chronic respiratory failure     Uses home oxygen at 2 L  . ADRENAL MASS 05/04/2007  . GLAUCOMA 03/30/2007  . HEMORRHOIDS, INTERNAL 03/23/2008  . OSTEOPOROSIS 03/30/2007  . SCOLIOSIS 03/30/2007  . PONV (postoperative nausea and vomiting)       Review of Systems   Constitutional:   No  weight loss, night sweats,  Fevers, chills,  +fatigue, or  lassitude.  HEENT:   No headaches,  Difficulty swallowing,  Tooth/dental problems, or  Sore throat,                No sneezing, itching, ear ache, nasal congestion, post nasal drip,   CV:  No chest pain,  Orthopnea, PND, swelling in lower extremities, anasarca, dizziness, palpitations, syncope.    GI  No bloody stools.   Resp:   No coughing up of blood.  [name]  No chest wall deformity  Skin: no rash or lesions.  GU: no dysuria, change in color of urine, no urgency or frequency.  No flank pain, no hematuria   MS:  No joint pain or swelling.  No decreased range of motion.  No back pain.  Psych:  No change in mood or affect. No depression or anxiety.  No memory loss.      Objective:   Physical Exam  Filed Vitals:   08/27/12 1124  BP: 98/66  Pulse: 90  Temp: 97.5 F (36.4 C)  TempSrc: Oral  Height: 5' (1.524 m)  Weight: 126 lb 12.8 oz (57.516 kg)  SpO2: 98%    Gen: Pleasant, well-nourished, in no distress, anxious  ENT: No lesions,  mouth clear,  oropharynx clear, no postnasal drip  Neck: No JVD, no TMG, no carotid bruits  Lungs: No use of accessory muscles, no dullness to percussion, distant BS, poor air flow  Cardiovascular: RRR, heart sounds normal, no murmur or gallops, no peripheral edema  Abdomen: soft and NT, no HSM,  BS normal  Musculoskeletal:  No deformities, no cyanosis or clubbing  Neuro: alert, non focal  Skin: Warm, no lesions or rashes  No results found.        Assessment & Plan:   COPD (chronic obstructive pulmonary disease), Gold D  Gold D Copd with recent exacerbation and PNA, now resolved Plan Hospice referral D/c foradil Change albuterol neb to qid Change Morphine IR to 15 mg qid -- Please give in the morning, at lunch, mid afternoon, and qhs  Change xanax 1 mg to tid (in the morning, at lunch, and mid afternoon) and xanax 0.5 mg qhs Please given morphine IR and xanax together  No change in inhaled or maintenance medications. Return in  3 mo    Updated Medication List Outpatient Encounter Prescriptions as of 08/27/2012  Medication Sig Dispense Refill  . acetaminophen (TYLENOL) 325 MG tablet Take 2 tablets (650 mg total) by mouth every 6 (six) hours as needed (or Fever >/= 101).      [name] albuterol (PROVENTIL) (2.5 MG/3ML)  0.083% nebulizer solution Take 2.5 mg by nebulization 4 (four) times daily.      [name] ALPRAZolam (XANAX) 0.25 MG tablet Take 0.5 mg by mouth at bedtime.       . ALPRAZolam (XANAX) 1 MG tablet Take 1 mg by mouth 3 (three) times daily. AM, lunch and midafternoon      . Alum & Mag Hydroxide-Simeth (MAGIC MOUTHWASH W/LIDOCAINE) SOLN Take 5 mLs by mouth 4 (four) times daily as needed (pain).    0  . antiseptic oral rinse (BIOTENE) LIQD 15 mLs by Mouth Rinse route as needed.      [name] atorvastatin (LIPITOR) 40 MG tablet Take 40 mg by mouth daily.      . benzonatate (TESSALON) 200 MG capsule Take 1 capsule (200 mg total) by mouth 3 (three) times daily as needed for cough.  20 capsule  0  . budesonide (PULMICORT) 0.25 MG/2ML nebulizer solution Take 2 mLs (0.25 mg total) by nebulization daily.  60 mL    . chlorpheniramine-HYDROcodone (TUSSIONEX) 10-8 MG/5ML LQCR Take 5 mLs by mouth every 12 (twelve) hours as needed.  140 mL  0  . Docusate Sodium (DSS) 100 MG CAPS Take 100 mg by mouth 2 (two) times daily.      . fluticasone (FLONASE) 50 MCG/ACT nasal spray Place 1 spray into the nose daily.      . furosemide (LASIX) 20 MG tablet Take 1 tablet (20 mg total) by mouth daily.  30 tablet    . Garlic (GARLIQUE) 400 MG TBEC Take 1 tablet by mouth daily.       [name] losartan (COZAAR) 50 MG tablet Take 50 mg by mouth daily. Hold for sbp <  Or = 100      . morphine (MSIR) 15 MG tablet Take one tablet Q AM, at lunch, mid afternoon and HS      . neomycin-bacitracin-polymyxin (NEOSPORIN) OINT Apply 1 application topically daily.      [name] omeprazole (PRILOSEC) 20 MG capsule Take 20 mg by mouth daily.      . ondansetron (ZOFRAN) 4 MG tablet Take 4 mg by mouth every 6 (six) hours as needed for nausea.      . potassium chloride (MICRO-K) 10 MEQ CR capsule Take 10 mEq by mouth daily.       . simethicone (GAS-X) 80 MG chewable tablet Chew 1 tablet (80 mg total) by mouth every 4 (four) hours as needed.  30 tablet  0  . sodium chloride  (  OCEAN) 0.65 % SOLN nasal spray Place 1 spray into the nose as needed for congestion.      [name] Spacer/Aero-Holding Chambers (AEROCHAMBER MV) inhaler Use as instructed  1 each  0  . [DISCONTINUED] albuterol (PROVENTIL) (2.5 MG/3ML) 0.083% nebulizer solution Take 3 mLs (2.5 mg total) by nebulization every 4 (four) hours as needed for wheezing or shortness of breath.  75 mL    . [DISCONTINUED] formoterol (FORADIL) 12 MCG capsule for inhaler Place 1 capsule (12 mcg total) into inhaler and inhale 2 (two) times daily.  60 capsule  12  . [DISCONTINUED] formoterol (FORADIL) 12 MCG capsule for inhaler Place 12 mcg into inhaler and inhale 2 (two) times daily.       . [DISCONTINUED] morphine (MSIR) 15 MG tablet Take 1 tablet (15 mg total) by mouth every 4 (four) hours as needed for pain (dyspnea).  30 tablet  0  . [DISCONTINUED] ondansetron (ZOFRAN) 4 MG tablet Take 1 tablet (4 mg total) by mouth every 8 (eight) hours as needed for nausea.  30 tablet  0  . [DISCONTINUED] ALPRAZolam (XANAX) 0.5 MG tablet Take 1 tablet (0.5 mg total) by mouth 3 (three) times daily as needed for anxiety. For anxiety and sleep  30 tablet  0  . [DISCONTINUED] esomeprazole (NEXIUM) 40 MG capsule Take 40 mg by mouth daily.       . [DISCONTINUED] losartan (COZAAR) 100 MG tablet Take 100 mg by mouth daily.      . [DISCONTINUED] nystatin (MYCOSTATIN) 100000 UNIT/ML suspension Take 5 mLs (500,000 Units total) by mouth 4 (four) times daily.  60 mL  0  . [DISCONTINUED] oxymetazoline (AFRIN) 0.05 % nasal spray Place 1 spray into the nose 2 (two) times daily.  30 mL    . [DISCONTINUED] tiotropium (SPIRIVA) 18 MCG inhalation capsule Place 1 capsule (18 mcg total) into inhaler and inhale daily.  30 capsule     No facility-administered encounter medications on file as of 08/27/2012.

## 2012-08-27 NOTE — Telephone Encounter (Signed)
Called, spoke with [facility] with Hospice.  Pt has 100 Medicare days.  Because she is at [facility], [place] not cover this and hospice.  Bronson Ing has gotten an order from the dr at [facility], believes this is Dr. Frederik Pear, for palliative care.  Once pt's medicare days are up, she will then be eligible for hospice.  Bronson Ing has spoken with pt's son who is in agreement with this.  Calling as FYI for Dr. Delford Field.  Will sign off and route to PW as FYI.

## 2012-08-28 NOTE — Assessment & Plan Note (Signed)
Gold D Copd with recent exacerbation and PNA, now resolved Plan Hospice referral D/c foradil Change albuterol neb to qid Change Morphine IR to 15 mg qid -- Please give in the morning, at lunch, mid afternoon, and qhs  Change xanax 1 mg to tid (in the morning, at lunch, and mid afternoon) and xanax 0.5 mg qhs Please given morphine IR and xanax together  No change in inhaled or maintenance medications. Return in  3 mo

## 2012-09-01 ENCOUNTER — Other Ambulatory Visit: Payer: Self-pay | Admitting: *Deleted

## 2012-09-01 MED ORDER — ALPRAZOLAM 1 MG PO TABS: ORAL_TABLET | ORAL | Status: DC

## 2012-09-01 MED ORDER — MORPHINE SULFATE 0.5 MG/ML IJ SOLN: INTRAMUSCULAR | Status: DC

## 2012-09-02 ENCOUNTER — Other Ambulatory Visit: Payer: Self-pay | Admitting: *Deleted

## 2012-09-02 MED ORDER — MORPHINE SULFATE 15 MG PO TABS: ORAL_TABLET | ORAL | Status: DC

## 2012-09-22 ENCOUNTER — Encounter: Payer: Self-pay | Admitting: Nurse Practitioner

## 2012-09-29 ENCOUNTER — Telehealth: Payer: Self-pay

## 2012-09-29 NOTE — Telephone Encounter (Signed)
[facility] [employer] faxed note pt is stating she is supposed to get morphine scheduled and prn.Please advise. Attached sheet with discharging doctors orders (in Dr Elmer Sow in box).  Also pt was discharge with orders to take garlique; pt states has not taken garlique in over a month.Please advise.

## 2012-09-30 ENCOUNTER — Encounter: Payer: Self-pay | Admitting: Family Medicine

## 2012-09-30 ENCOUNTER — Telehealth: Payer: Self-pay | Admitting: Family Medicine

## 2012-09-30 ENCOUNTER — Ambulatory Visit (INDEPENDENT_AMBULATORY_CARE_PROVIDER_SITE_OTHER): Payer: Medicare Other | Admitting: Family Medicine

## 2012-09-30 ENCOUNTER — Telehealth: Payer: Self-pay | Admitting: Critical Care Medicine

## 2012-09-30 VITALS — BP 160/80 | HR 94 | Temp 98.8°F

## 2012-09-30 DIAGNOSIS — J4489 Other specified chronic obstructive pulmonary disease: Secondary | ICD-10-CM

## 2012-09-30 DIAGNOSIS — J449 Chronic obstructive pulmonary disease, unspecified: Secondary | ICD-10-CM

## 2012-09-30 MED ORDER — DOXYCYCLINE HYCLATE 100 MG PO TABS: 100.0000 mg | ORAL_TABLET | Freq: Two times a day (BID) | ORAL | Status: DC

## 2012-09-30 NOTE — Progress Notes (Signed)
79  yo with h/o COPD well known to me here with inc in cough and change/inc in sputum over past week.  She is now living in [facility], her husband unfortunately recently died.  Several residents have similar symptoms.  Saw Dr. Delford Field last month.  COPD is worsening.   .     Some relief of cough with nebs . No longer on inhalers due to declining lung function.  Pulmicort nebs as well.  No fevers.    Patient Active Problem List  Diagnosis  . SHINGLES  . ADRENAL MASS  . HYPERLIPIDEMIA  . GLAUCOMA  . CERUMEN IMPACTION, LEFT  . HYPERTENSION  . ABDOMINAL AORTIC ANEURYSM  . HEMORRHOIDS, INTERNAL  . GASTRITIS  . DUODENITIS, WITHOUT HEMORRHAGE  . HIATAL HERNIA  . PSORIASIS  . SEBORRHEIC KERATOSIS  . OSTEOARTHRITIS  . HIP PAIN, RIGHT  . BACK PAIN, LUMBAR  . OSTEOPOROSIS  . SCOLIOSIS  . FATIGUE  . RIB PAIN, LEFT SIDED  . DYSPHAGIA UNSPECIFIED  . DYSPHAGIA  . CHANGE IN BOWELS  . GROIN PAIN  . Laceration of lower leg  . COPD (chronic obstructive pulmonary disease), Gold D   . GERD (gastroesophageal reflux disease)  . HTN (hypertension)  . Hyperlipemia  . Chronic respiratory failure  . Right shoulder pain  . Chronic respiratory failure with hypercapnia  . Dyslipidemia  . Anxiety  . Hypoalbuminemia  . Anemia   Past Medical History  Diagnosis Date  . History of colonic polyps   . COPD (chronic obstructive pulmonary disease)   . Diverticulitis of colon   . GERD (gastroesophageal reflux disease)   . HTN (hypertension)   . Osteoarthritis   . Hyperlipemia   . Aortic aneurysm, abdominal   . Ovarian cyst, left   . Psoriasis   . Chronic respiratory failure     Uses home oxygen at 2 L  . ADRENAL MASS 05/04/2007  . GLAUCOMA 03/30/2007  . HEMORRHOIDS, INTERNAL 03/23/2008  . OSTEOPOROSIS 03/30/2007  . SCOLIOSIS 03/30/2007  . PONV (postoperative nausea and vomiting)    Past Surgical History  Procedure Laterality Date  . Kidney stone surgery    . Appendectomy     . Tonsillectomy    . Hemiarthroplasty shoulder fracture    . Cataract extraction, bilateral    . Abdominal aortic aneurysm repair    . Abdominal hysterectomy     History  Substance Use Topics  . Smoking status: Former Smoker -- 0.50 packs/day for 40 years    Types: Cigarettes    Quit date: 06/16/2009  . Smokeless tobacco: Never Used  . Alcohol Use: No   Family History  Problem Relation Age of Onset  . Heart attack Mother   . Heart attack Father   . Deep vein thrombosis Father   . Cancer Brother     Pancreas and colon   Allergies  Allergen Reactions  . Clarithromycin     REACTION: abd pain  . Codeine   . Hydrocodone-Guaifenesin     REACTION: feels funny  . Iohexol      Desc: pt reports dyspnea and throat swelling about age 48 w/ IV contrast reaction when kidneys were being checked; 07/25/08 [facility]   . Moxifloxacin     REACTION: hallucinations  . Penicillins   . Prednisone Other (See Comments)    Can take but at a lower dosage  . Simvastatin   . Sulfonamide Derivatives     REACTION: unknown   Current Outpatient Prescriptions on File Prior to  Visit  Medication Sig Dispense Refill  . acetaminophen (TYLENOL) 325 MG tablet Take 2 tablets (650 mg total) by mouth every 6 (six) hours as needed (or Fever >/= 101).      [name] albuterol (PROVENTIL) (2.5 MG/3ML) 0.083% nebulizer solution Take 2.5 mg by nebulization 4 (four) times daily.      [name] ALPRAZolam (XANAX) 0.25 MG tablet Take 0.5 mg by mouth at bedtime.       . ALPRAZolam (XANAX) 1 MG tablet Take 1 tablet three times a day scheduled 8am, 2pm, and 9pm for anxiety  90 tablet  5  . Alum & Mag Hydroxide-Simeth (MAGIC MOUTHWASH W/LIDOCAINE) SOLN Take 5 mLs by mouth 4 (four) times daily as needed (pain).    0  . antiseptic oral rinse (BIOTENE) LIQD 15 mLs by Mouth Rinse route as needed.      [name] atorvastatin (LIPITOR) 40 MG tablet Take 40 mg by mouth daily.      . budesonide (PULMICORT) 0.25 MG/2ML nebulizer solution Take 2 mLs (0.25 mg  total) by nebulization daily.  60 mL    . chlorpheniramine-HYDROcodone (TUSSIONEX) 10-8 MG/5ML LQCR Take 5 mLs by mouth every 12 (twelve) hours as needed.  140 mL  0  . Docusate Sodium (DSS) 100 MG CAPS Take 100 mg by mouth 2 (two) times daily.      . fluticasone (FLONASE) 50 MCG/ACT nasal spray Place 1 spray into the nose daily.      . furosemide (LASIX) 20 MG tablet Take 1 tablet (20 mg total) by mouth daily.  30 tablet    . losartan (COZAAR) 50 MG tablet Take 50 mg by mouth daily. Hold for sbp <  Or = 100      . morphine (MSIR) 15 MG tablet Take 1 tablet twice a day as needed for breakthrough pain  60 tablet  0  . neomycin-bacitracin-polymyxin (NEOSPORIN) OINT Apply 1 application topically daily.      [name] omeprazole (PRILOSEC) 20 MG capsule Take 20 mg by mouth daily.      . potassium chloride (MICRO-K) 10 MEQ CR capsule Take 10 mEq by mouth daily.       . simethicone (GAS-X) 80 MG chewable tablet Chew 1 tablet (80 mg total) by mouth every 4 (four) hours as needed.  30 tablet  0  . sodium chloride (OCEAN) 0.65 % SOLN nasal spray Place 1 spray into the nose as needed for congestion.      [name] Spacer/Aero-Holding Chambers (AEROCHAMBER MV) inhaler Use as instructed  1 each  0   No current facility-administered medications on file prior to visit.   The PMH, PSH, Social History, Family History, Medications, and allergies have been reviewed in [facility], and have been updated if relevant.   Review of Systems       See HPI General:  Complains of chills; denies fever. CV:  Denies chest pain or discomfort. Resp:  Complains of cough, shortness of breath, sputum productive, and wheezing.  Physical Exam BP 160/80  Pulse 94  Temp(Src) 98.8 F (37.1 C)  SpO2 95%  General:  GEN: nad, alert and oriented HEENT: mucous membranes moist, TM wnl, no OP exudates NECK: supple w/o LA CV: rrr.   PULM:  No inc wob, she does have bibasliar wheezes and rhonchi today, on 3 L 02 (baseline) ABD: soft, +bs EXT: no  edema SKIN: no acute rash   Assessment and Plan: 1. COPD (chronic obstructive pulmonary disease), Gold D   Deteriorated. She is insisting that  Levaquin made her very ill and would like a different abx. Will try doxycyline 100 mg twice daily. She has morphine that she takes every 6 hours for SOB. Call or return to clinic prn if these symptoms worsen or fail to improve as anticipated.

## 2012-09-30 NOTE — Telephone Encounter (Signed)
AT[facility] NA WCB 

## 2012-09-30 NOTE — Telephone Encounter (Signed)
Orders faxed back to [facility]

## 2012-09-30 NOTE — Telephone Encounter (Signed)
Patient calls from [facility] requesting oxygen order.  Per Med Tech with patient she has working oxygen but wants the smaller portable ones so she can leave the room.  The nurse that handles these things is out until after Easter. The Med Tech said she would have  The patient's son follow up with Pulmonologist. Dr. Delford Field,  who looks to be the one that is handling her oxygen needs.

## 2012-09-30 NOTE — Telephone Encounter (Signed)
See note on my desk.

## 2012-09-30 NOTE — Patient Instructions (Addendum)
Good to see you. I'm so sorry to hear about Mr. Wendel.  Please take doxycycline as directed- 1 tablet twice daily x 10 days.  Continue your nebuzliers.

## 2012-10-04 ENCOUNTER — Telehealth: Payer: Self-pay | Admitting: Critical Care Medicine

## 2012-10-04 ENCOUNTER — Other Ambulatory Visit: Payer: Self-pay | Admitting: *Deleted

## 2012-10-04 ENCOUNTER — Encounter: Payer: Self-pay | Admitting: *Deleted

## 2012-10-04 DIAGNOSIS — J449 Chronic obstructive pulmonary disease, unspecified: Secondary | ICD-10-CM

## 2012-10-04 NOTE — Telephone Encounter (Signed)
Order given to [email or web address]  to get portable 02 [name]

## 2012-10-04 NOTE — Telephone Encounter (Signed)
I spoke with pt. He is requesting an order for pt get more portable 02 sent to [facility]. Pt is now at [employer]. Tanks pt has is too heavy for her for her to carry around. Please advise Dr. Delford Field is okay to send order thanks

## 2012-10-04 NOTE — Telephone Encounter (Signed)
i am ok with [facility] getting lighter weight portable oxygen

## 2012-10-04 NOTE — Telephone Encounter (Signed)
Order sent to [facility];s. 

## 2012-10-05 ENCOUNTER — Inpatient Hospital Stay (HOSPITAL_COMMUNITY)
Admission: EM | Admit: 2012-10-05 | Discharge: 2012-10-13 | DRG: 871 | Disposition: A | Discharge status: Nursing Home (Includes ICF) | Payer: Medicare Other | Attending: Internal Medicine | Admitting: Internal Medicine

## 2012-10-05 ENCOUNTER — Inpatient Hospital Stay (HOSPITAL_COMMUNITY): Payer: Medicare Other

## 2012-10-05 ENCOUNTER — Emergency Department (HOSPITAL_COMMUNITY): Payer: Medicare Other

## 2012-10-05 ENCOUNTER — Encounter (HOSPITAL_COMMUNITY): Payer: Self-pay

## 2012-10-05 DIAGNOSIS — Z79899 Other long term (current) drug therapy: Secondary | ICD-10-CM

## 2012-10-05 DIAGNOSIS — D649 Anemia, unspecified: Secondary | ICD-10-CM

## 2012-10-05 DIAGNOSIS — R109 Unspecified abdominal pain: Secondary | ICD-10-CM

## 2012-10-05 DIAGNOSIS — R5381 Other malaise: Secondary | ICD-10-CM

## 2012-10-05 DIAGNOSIS — M810 Age-related osteoporosis without current pathological fracture: Secondary | ICD-10-CM

## 2012-10-05 DIAGNOSIS — G9349 Other encephalopathy: Secondary | ICD-10-CM | POA: Diagnosis present

## 2012-10-05 DIAGNOSIS — Z8719 Personal history of other diseases of the digestive system: Secondary | ICD-10-CM

## 2012-10-05 DIAGNOSIS — N179 Acute kidney failure, unspecified: Secondary | ICD-10-CM

## 2012-10-05 DIAGNOSIS — Z66 Do not resuscitate: Secondary | ICD-10-CM | POA: Diagnosis present

## 2012-10-05 DIAGNOSIS — T8132XA Disruption of internal operation (surgical) wound, not elsewhere classified, initial encounter: Secondary | ICD-10-CM | POA: Diagnosis present

## 2012-10-05 DIAGNOSIS — E785 Hyperlipidemia, unspecified: Secondary | ICD-10-CM | POA: Diagnosis present

## 2012-10-05 DIAGNOSIS — T81329A Deep disruption or dehiscence of operation wound, unspecified, initial encounter: Secondary | ICD-10-CM | POA: Diagnosis present

## 2012-10-05 DIAGNOSIS — R198 Other specified symptoms and signs involving the digestive system and abdomen: Secondary | ICD-10-CM

## 2012-10-05 DIAGNOSIS — J9610 Chronic respiratory failure, unspecified whether with hypoxia or hypercapnia: Secondary | ICD-10-CM | POA: Diagnosis present

## 2012-10-05 DIAGNOSIS — L821 Other seborrheic keratosis: Secondary | ICD-10-CM

## 2012-10-05 DIAGNOSIS — K219 Gastro-esophageal reflux disease without esophagitis: Secondary | ICD-10-CM | POA: Diagnosis present

## 2012-10-05 DIAGNOSIS — F419 Anxiety disorder, unspecified: Secondary | ICD-10-CM | POA: Diagnosis present

## 2012-10-05 DIAGNOSIS — R079 Chest pain, unspecified: Secondary | ICD-10-CM

## 2012-10-05 DIAGNOSIS — K2990 Gastroduodenitis, unspecified, without bleeding: Secondary | ICD-10-CM

## 2012-10-05 DIAGNOSIS — Z87891 Personal history of nicotine dependence: Secondary | ICD-10-CM

## 2012-10-05 DIAGNOSIS — K5900 Constipation, unspecified: Secondary | ICD-10-CM | POA: Diagnosis present

## 2012-10-05 DIAGNOSIS — J449 Chronic obstructive pulmonary disease, unspecified: Secondary | ICD-10-CM | POA: Diagnosis present

## 2012-10-05 DIAGNOSIS — E876 Hypokalemia: Secondary | ICD-10-CM | POA: Diagnosis not present

## 2012-10-05 DIAGNOSIS — T380X5A Adverse effect of glucocorticoids and synthetic analogues, initial encounter: Secondary | ICD-10-CM | POA: Diagnosis present

## 2012-10-05 DIAGNOSIS — I714 Abdominal aortic aneurysm, without rupture, unspecified: Secondary | ICD-10-CM | POA: Diagnosis present

## 2012-10-05 DIAGNOSIS — J441 Chronic obstructive pulmonary disease with (acute) exacerbation: Secondary | ICD-10-CM | POA: Diagnosis present

## 2012-10-05 DIAGNOSIS — M545 Low back pain: Secondary | ICD-10-CM

## 2012-10-05 DIAGNOSIS — H409 Unspecified glaucoma: Secondary | ICD-10-CM | POA: Diagnosis present

## 2012-10-05 DIAGNOSIS — M25511 Pain in right shoulder: Secondary | ICD-10-CM

## 2012-10-05 DIAGNOSIS — M1990 Unspecified osteoarthritis, unspecified site: Secondary | ICD-10-CM

## 2012-10-05 DIAGNOSIS — K2980 Duodenitis without bleeding: Secondary | ICD-10-CM

## 2012-10-05 DIAGNOSIS — D638 Anemia in other chronic diseases classified elsewhere: Secondary | ICD-10-CM | POA: Diagnosis present

## 2012-10-05 DIAGNOSIS — Z880 Allergy status to penicillin: Secondary | ICD-10-CM

## 2012-10-05 DIAGNOSIS — E8809 Other disorders of plasma-protein metabolism, not elsewhere classified: Secondary | ICD-10-CM

## 2012-10-05 DIAGNOSIS — IMO0002 Reserved for concepts with insufficient information to code with codable children: Secondary | ICD-10-CM

## 2012-10-05 DIAGNOSIS — F411 Generalized anxiety disorder: Secondary | ICD-10-CM

## 2012-10-05 DIAGNOSIS — J9612 Chronic respiratory failure with hypercapnia: Secondary | ICD-10-CM

## 2012-10-05 DIAGNOSIS — T82330A Leakage of aortic (bifurcation) graft (replacement), initial encounter: Secondary | ICD-10-CM

## 2012-10-05 DIAGNOSIS — T68XXXA Hypothermia, initial encounter: Secondary | ICD-10-CM

## 2012-10-05 DIAGNOSIS — M4120 Other idiopathic scoliosis, site unspecified: Secondary | ICD-10-CM

## 2012-10-05 DIAGNOSIS — Y832 Surgical operation with anastomosis, bypass or graft as the cause of abnormal reaction of the patient, or of later complication, without mention of misadventure at the time of the procedure: Secondary | ICD-10-CM | POA: Diagnosis present

## 2012-10-05 DIAGNOSIS — J4489 Other specified chronic obstructive pulmonary disease: Secondary | ICD-10-CM

## 2012-10-05 DIAGNOSIS — I959 Hypotension, unspecified: Secondary | ICD-10-CM

## 2012-10-05 DIAGNOSIS — R1319 Other dysphagia: Secondary | ICD-10-CM

## 2012-10-05 DIAGNOSIS — I10 Essential (primary) hypertension: Secondary | ICD-10-CM | POA: Diagnosis present

## 2012-10-05 DIAGNOSIS — T82330D Leakage of aortic (bifurcation) graft (replacement), subsequent encounter: Secondary | ICD-10-CM

## 2012-10-05 DIAGNOSIS — E278 Other specified disorders of adrenal gland: Secondary | ICD-10-CM

## 2012-10-05 DIAGNOSIS — K648 Other hemorrhoids: Secondary | ICD-10-CM

## 2012-10-05 DIAGNOSIS — A419 Sepsis, unspecified organism: Principal | ICD-10-CM | POA: Diagnosis present

## 2012-10-05 DIAGNOSIS — B029 Zoster without complications: Secondary | ICD-10-CM

## 2012-10-05 DIAGNOSIS — R1310 Dysphagia, unspecified: Secondary | ICD-10-CM

## 2012-10-05 DIAGNOSIS — K449 Diaphragmatic hernia without obstruction or gangrene: Secondary | ICD-10-CM

## 2012-10-05 DIAGNOSIS — J189 Pneumonia, unspecified organism: Secondary | ICD-10-CM | POA: Diagnosis present

## 2012-10-05 DIAGNOSIS — Z9981 Dependence on supplemental oxygen: Secondary | ICD-10-CM

## 2012-10-05 DIAGNOSIS — L408 Other psoriasis: Secondary | ICD-10-CM

## 2012-10-05 DIAGNOSIS — G9340 Encephalopathy, unspecified: Secondary | ICD-10-CM | POA: Diagnosis not present

## 2012-10-05 LAB — CBC WITH DIFFERENTIAL/PLATELET
Basophils Relative: 0 % (ref 0–1)
Hemoglobin: 9.6 g/dL — ABNORMAL LOW (ref 12.0–15.0)
Lymphs Abs: 1.1 10*3/uL (ref 0.7–4.0)
MCHC: 30.4 g/dL (ref 30.0–36.0)
Monocytes Relative: 15 % — ABNORMAL HIGH (ref 3–12)
Neutro Abs: 3.9 10*3/uL (ref 1.7–7.7)
Neutrophils Relative %: 65 % (ref 43–77)
RBC: 3.61 MIL/uL — ABNORMAL LOW (ref 3.87–5.11)
WBC: 6.1 10*3/uL (ref 4.0–10.5)

## 2012-10-05 LAB — POCT I-STAT, CHEM 8
BUN: 14 mg/dL (ref 6–23)
Calcium, Ion: 1.11 mmol/L — ABNORMAL LOW (ref 1.13–1.30)
Chloride: 106 mEq/L (ref 96–112)
Glucose, Bld: 102 mg/dL — ABNORMAL HIGH (ref 70–99)

## 2012-10-05 LAB — URINALYSIS, ROUTINE W REFLEX MICROSCOPIC
Bilirubin Urine: NEGATIVE
Glucose, UA: NEGATIVE mg/dL
Hgb urine dipstick: NEGATIVE
Ketones, ur: NEGATIVE mg/dL
Protein, ur: 30 mg/dL — AB

## 2012-10-05 LAB — CG4 I-STAT (LACTIC ACID): Lactic Acid, Venous: 0.86 mmol/L (ref 0.5–2.2)

## 2012-10-05 LAB — COMPREHENSIVE METABOLIC PANEL
AST: 17 U/L (ref 0–37)
Albumin: 2.6 g/dL — ABNORMAL LOW (ref 3.5–5.2)
Alkaline Phosphatase: 83 U/L (ref 39–117)
BUN: 16 mg/dL (ref 6–23)
Chloride: 102 mEq/L (ref 96–112)
Potassium: 3.5 mEq/L (ref 3.5–5.1)
Total Bilirubin: 0.3 mg/dL (ref 0.3–1.2)

## 2012-10-05 LAB — URINE MICROSCOPIC-ADD ON

## 2012-10-05 LAB — POCT I-STAT TROPONIN I: Troponin i, poc: 0.01 ng/mL (ref 0.00–0.08)

## 2012-10-05 LAB — MRSA PCR SCREENING: MRSA by PCR: NEGATIVE

## 2012-10-05 LAB — TROPONIN I: Troponin I: <0.3 ng/mL (ref <0.30)

## 2012-10-05 MED ORDER — BUDESONIDE 0.25 MG/2ML IN SUSP
0.2500 mg | Freq: Two times a day (BID) | RESPIRATORY_TRACT | Status: DC
  Administered 2012-10-05 – 2012-10-13 (×16): 0.25 mg via RESPIRATORY_TRACT
  Filled 2012-10-05 (×20): qty 2

## 2012-10-05 MED ORDER — ALUM & MAG HYDROXIDE-SIMETH 200-200-20 MG/5ML PO SUSP
30.0000 mL | Freq: Four times a day (QID) | ORAL | Status: DC | PRN
  Administered 2012-10-05 – 2012-10-08 (×2): 30 mL via ORAL
  Filled 2012-10-05 (×2): qty 30

## 2012-10-05 MED ORDER — ONDANSETRON HCL 4 MG/2ML IJ SOLN
4.0000 mg | Freq: Four times a day (QID) | INTRAMUSCULAR | Status: DC | PRN
  Administered 2012-10-07: 4 mg via INTRAVENOUS
  Filled 2012-10-05: qty 2

## 2012-10-05 MED ORDER — SODIUM CHLORIDE 0.9 % IV SOLN
1000.0000 mL | Freq: Once | INTRAVENOUS | Status: AC
  Administered 2012-10-05: 1000 mL via INTRAVENOUS

## 2012-10-05 MED ORDER — ACETAMINOPHEN 650 MG RE SUPP: 650.0000 mg | Freq: Four times a day (QID) | RECTAL | Status: DC | PRN

## 2012-10-05 MED ORDER — LEVOFLOXACIN IN D5W 750 MG/150ML IV SOLN
750.0000 mg | INTRAVENOUS | Status: DC
  Administered 2012-10-07 – 2012-10-09 (×2): 750 mg via INTRAVENOUS
  Filled 2012-10-05 (×2): qty 150

## 2012-10-05 MED ORDER — IPRATROPIUM BROMIDE 0.02 % IN SOLN
0.5000 mg | Freq: Four times a day (QID) | RESPIRATORY_TRACT | Status: DC
  Administered 2012-10-05 – 2012-10-13 (×30): 0.5 mg via RESPIRATORY_TRACT
  Filled 2012-10-05 (×31): qty 2.5

## 2012-10-05 MED ORDER — SODIUM CHLORIDE 0.9 % IV SOLN
1000.0000 mL | INTRAVENOUS | Status: DC
  Administered 2012-10-05 (×2): 1000 mL via INTRAVENOUS

## 2012-10-05 MED ORDER — ALBUTEROL SULFATE (5 MG/ML) 0.5% IN NEBU
2.5000 mg | INHALATION_SOLUTION | Freq: Four times a day (QID) | RESPIRATORY_TRACT | Status: DC
  Administered 2012-10-05 – 2012-10-13 (×30): 2.5 mg via RESPIRATORY_TRACT
  Filled 2012-10-05: qty 0.5
  Filled 2012-10-05: qty 5.5
  Filled 2012-10-05 (×28): qty 0.5

## 2012-10-05 MED ORDER — HYDROCOD POLST-CHLORPHEN POLST 10-8 MG/5ML PO LQCR
5.0000 mL | Freq: Two times a day (BID) | ORAL | Status: DC | PRN
  Administered 2012-10-05 – 2012-10-13 (×8): 5 mL via ORAL
  Filled 2012-10-05 (×8): qty 5

## 2012-10-05 MED ORDER — SIMETHICONE 80 MG PO CHEW: 80.0000 mg | CHEWABLE_TABLET | Freq: Four times a day (QID) | ORAL | Status: DC | PRN

## 2012-10-05 MED ORDER — SALINE SPRAY 0.65 % NA SOLN
1.0000 | NASAL | Status: DC | PRN
  Filled 2012-10-05: qty 44

## 2012-10-05 MED ORDER — ONDANSETRON HCL 4 MG PO TABS: 4.0000 mg | ORAL_TABLET | Freq: Four times a day (QID) | ORAL | Status: DC | PRN

## 2012-10-05 MED ORDER — SODIUM CHLORIDE 0.9 % IV SOLN
INTRAVENOUS | Status: DC
  Administered 2012-10-05: 16:00:00 via INTRAVENOUS
  Administered 2012-10-06: 1000 mL via INTRAVENOUS
  Administered 2012-10-07: 05:00:00 via INTRAVENOUS
  Administered 2012-10-07: 10 mL/h via INTRAVENOUS

## 2012-10-05 MED ORDER — VANCOMYCIN HCL IN DEXTROSE 750-5 MG/150ML-% IV SOLN
750.0000 mg | INTRAVENOUS | Status: DC
  Administered 2012-10-05: 750 mg via INTRAVENOUS
  Filled 2012-10-05 (×2): qty 150

## 2012-10-05 MED ORDER — ALPRAZOLAM 0.5 MG PO TABS
0.5000 mg | ORAL_TABLET | Freq: Three times a day (TID) | ORAL | Status: DC | PRN
  Administered 2012-10-05 – 2012-10-06 (×3): 0.5 mg via ORAL
  Filled 2012-10-05 (×4): qty 1

## 2012-10-05 MED ORDER — SODIUM CHLORIDE 0.9 % IV BOLUS (SEPSIS)
500.0000 mL | Freq: Once | INTRAVENOUS | Status: AC
  Administered 2012-10-05: 500 mL via INTRAVENOUS

## 2012-10-05 MED ORDER — ENOXAPARIN SODIUM 40 MG/0.4ML ~~LOC~~ SOLN
40.0000 mg | SUBCUTANEOUS | Status: DC
  Administered 2012-10-05 – 2012-10-12 (×8): 40 mg via SUBCUTANEOUS
  Filled 2012-10-05 (×11): qty 0.4

## 2012-10-05 MED ORDER — SODIUM CHLORIDE 0.9 % IJ SOLN
3.0000 mL | Freq: Two times a day (BID) | INTRAMUSCULAR | Status: DC
  Administered 2012-10-05: 3 mL via INTRAVENOUS
  Administered 2012-10-06: 10:00:00 via INTRAVENOUS
  Administered 2012-10-07 – 2012-10-13 (×11): 3 mL via INTRAVENOUS

## 2012-10-05 MED ORDER — PANTOPRAZOLE SODIUM 40 MG IV SOLR
40.0000 mg | INTRAVENOUS | Status: DC
  Administered 2012-10-05 – 2012-10-06 (×2): 40 mg via INTRAVENOUS
  Filled 2012-10-05 (×3): qty 40

## 2012-10-05 MED ORDER — ALPRAZOLAM 0.5 MG PO TABS
0.5000 mg | ORAL_TABLET | Freq: Every day | ORAL | Status: DC
  Administered 2012-10-05 – 2012-10-07 (×3): 0.5 mg via ORAL
  Filled 2012-10-05 (×2): qty 1

## 2012-10-05 MED ORDER — ACETAMINOPHEN 325 MG PO TABS
650.0000 mg | ORAL_TABLET | Freq: Four times a day (QID) | ORAL | Status: DC | PRN
  Administered 2012-10-06 (×3): 650 mg via ORAL
  Filled 2012-10-05: qty 1
  Filled 2012-10-05 (×2): qty 2

## 2012-10-05 MED ORDER — FLUTICASONE PROPIONATE 50 MCG/ACT NA SUSP
1.0000 | Freq: Every day | NASAL | Status: DC
  Administered 2012-10-06 – 2012-10-13 (×7): 1 via NASAL
  Filled 2012-10-05: qty 16

## 2012-10-05 MED ORDER — LEVOFLOXACIN IN D5W 750 MG/150ML IV SOLN
750.0000 mg | Freq: Once | INTRAVENOUS | Status: AC
  Administered 2012-10-05: 750 mg via INTRAVENOUS
  Filled 2012-10-05: qty 150

## 2012-10-05 NOTE — Progress Notes (Signed)
Clinical Social Work Department BRIEF PSYCHOSOCIAL ASSESSMENT 10/05/2012  Patient:  [name], [name]     Account Number:  [id number]     Admit date:  10/05/2012  Clinical Social Worker:  Doree Albee  Date/Time:  10/05/2012 11:50 AM  Referred by:  RN  Date Referred:  10/05/2012 Referred for  SNF Placement  ALF Placement   Other Referral:   Interview type:  Patient Other interview type:   and patient sister    PSYCHOSOCIAL DATA Living Status:  FACILITY Admitted from facility:   Level of care:  Assisted Living Primary support name:  [name] Primary support relationship to patient:  CHILD, ADULT Degree of support available:   strong, hcpoa and pt sister    CURRENT CONCERNS Current Concerns  Post-Acute Placement   Other Concerns:    SOCIAL WORK ASSESSMENT / PLAN CSW met with pt and patient sister at bedside to complete psychosocial assessment. Patient is alert and oriented to self and place. Patient sister assisted with some information for assessment. Pt and pt sister confirmed tha tpatient is a resident at [facility] [phone]). Patient sister, Concepcion Elk that patient was recently at [facility]. Pt and pt sister also shared that patient husband of 41 years recently passed after batteling cancer. Patient stated that she may be interested in speak with a clinical social worker here however wants to focus on getting better first. Patient is open to returning to [facility] or [facility] when medically stable depending upon recommending discharge plans. Patient sister shared that patient son is patient hcpoa. Patient son is [name] at [phone]. Per [facility], patient was reciving minimal assistance with bathing, and was independent with the rest of her adls. Patient recieves some assistance with medications and oxygen   Assessment/plan status:  Psychosocial Support/Ongoing Assessment of Needs Other  assessment/ plan:   Information/referral to community resources:   none identified at this time    PATIENT'S/FAMILY'S RESPONSE TO PLAN OF CARE: Pt and pt family thanked csw for concern and support. Pt states that she is ready to feel better and return to [facility] or [facility] depending on recommendations.      Catha Gosselin, LCSWA  [phone] .10/05/2012 1749pm

## 2012-10-05 NOTE — H&P (Signed)
PATIENT DETAILS Name: [name] Age: 79 y.o. Sex: female Date of Birth: [birth year] Admit Date: 10/05/2012 [id number] Dayton Martes, MD   CHIEF COMPLAINT:  Cough, Epigastric pain  HPI: [name] is a 79 y.o. female with a Past Medical History of COPD on home O2,  HTN,  who presents today with the above noted complaint.Per patient for the last week, she has not been feeling well. Claims to have worsening non productive cough and some worsening of her baseline COPD. She recently had seen her PCP for this and was prescribed Doxycycline with no relief. She denies any fever to me. Denies any nausea or vomting or diarrhea. Claims the epigastric pain is so much better than what it was when she presented to the ED. In the ED she was noted to be hypotensive and mildly hypothermic, and was given IVF boluses, during my evaluation, patient was alert and BP had increased to the low 100 systolic range. She is now being admitted to the SDU for further evaluation and treatment.  Her only complaint during my evaluation was that she wanted her foley catheter removed.   ALLERGIES:   Allergies  Allergen Reactions  . Clarithromycin     REACTION: abd pain  . Codeine   . Hydrocodone-Guaifenesin     REACTION: feels funny  . Iohexol      Desc: pt reports dyspnea and throat swelling about age 52 w/ IV contrast reaction when kidneys were being checked; 07/25/08 SLG   . Moxifloxacin     REACTION: hallucinations  . Penicillins   . Prednisone Other (See Comments)    Can take but at a lower dosage  . Simvastatin   . Sulfonamide Derivatives     REACTION: unknown    PAST MEDICAL HISTORY: Past Medical History  Diagnosis Date  . History of colonic polyps   . COPD (chronic obstructive pulmonary disease)   . Diverticulitis of colon   . GERD (gastroesophageal reflux disease)   . HTN (hypertension)   . Osteoarthritis   . Hyperlipemia   . Aortic aneurysm, abdominal   . Ovarian cyst, left   . Psoriasis   .  Chronic respiratory failure     Uses home oxygen at 2 L  . ADRENAL MASS 05/04/2007  . GLAUCOMA 03/30/2007  . HEMORRHOIDS, INTERNAL 03/23/2008  . OSTEOPOROSIS 03/30/2007  . SCOLIOSIS 03/30/2007  . PONV (postoperative nausea and vomiting)     PAST SURGICAL HISTORY: Past Surgical History  Procedure Laterality Date  . Kidney stone surgery    . Appendectomy    . Tonsillectomy    . Hemiarthroplasty shoulder fracture    . Cataract extraction, bilateral    . Abdominal aortic aneurysm repair    . Abdominal hysterectomy      MEDICATIONS AT HOME: Prior to Admission medications   Medication Sig Start Date End Date Taking? Authorizing Provider  acetaminophen (TYLENOL) 325 MG tablet Take 2 tablets (650 mg total) by mouth every 6 (six) hours as needed (or Fever >/= 101). 07/06/12  Yes Renae Fickle, MD  albuterol (PROVENTIL) (2.5 MG/3ML) 0.083% nebulizer solution Take 2.5 mg by nebulization 4 (four) times daily. 07/06/12  Yes Renae Fickle, MD  ALPRAZolam Prudy Feeler) 0.25 MG tablet Take 0.5 mg by mouth at bedtime.    Yes Historical Provider, MD  ALPRAZolam Prudy Feeler) 1 MG tablet Take 1 tablet three times a day scheduled 8am, 2pm, and 9pm for anxiety 09/01/12  Yes Claudie Revering, NP  Alum & Mag Hydroxide-Simeth (MAGIC MOUTHWASH W/LIDOCAINE)  SOLN Take 5 mLs by mouth 4 (four) times daily as needed (pain). 07/06/12  Yes Renae Fickle, MD  antiseptic oral rinse (BIOTENE) LIQD 15 mLs by Mouth Rinse route as needed. 07/06/12  Yes Renae Fickle, MD  atorvastatin (LIPITOR) 40 MG tablet Take 40 mg by mouth daily.   Yes Historical Provider, MD  budesonide (PULMICORT) 0.25 MG/2ML nebulizer solution Take 2 mLs (0.25 mg total) by nebulization daily. 07/06/12  Yes Renae Fickle, MD  chlorpheniramine-HYDROcodone (TUSSIONEX) 10-8 MG/5ML LQCR Take 5 mLs by mouth every 12 (twelve) hours as needed. 07/06/12  Yes Renae Fickle, MD  Docusate Sodium (DSS) 100 MG CAPS Take 100 mg by mouth 2 (two) times daily. 04/30/12  Yes  Lesle Chris Black, NP  doxycycline (VIBRA-TABS) 100 MG tablet Take 100 mg by mouth every 12 (twelve) hours. Pt's on day 4 of therapy. Started on 10-01-12 09/30/12  Yes Dianne Dun, MD  fluticasone [facility] [place]) 50 MCG/ACT nasal spray Place 1 spray into the nose daily. 07/06/12  Yes Renae Fickle, MD  furosemide (LASIX) 20 MG tablet Take 1 tablet (20 mg total) by mouth daily. 07/06/12  Yes Renae Fickle, MD  losartan (COZAAR) 50 MG tablet Take 50 mg by mouth daily. Hold for sbp <  Or = 100   Yes Historical Provider, MD  morphine (MSIR) 15 MG tablet Take 1 tablet twice a day as needed for breakthrough pain 09/02/12  Yes Tiffany L Reed, DO  neomycin-bacitracin-polymyxin (NEOSPORIN) OINT Apply 1 application topically daily. 07/06/12  Yes Renae Fickle, MD  omeprazole (PRILOSEC) 20 MG capsule Take 20 mg by mouth daily.   Yes Historical Provider, MD  potassium chloride (MICRO-K) 10 MEQ CR capsule Take 10 mEq by mouth daily.  06/24/12 06/24/13 Yes Dianne Dun, MD  simethicone (GAS-X) 80 MG chewable tablet Chew 1 tablet (80 mg total) by mouth every 4 (four) hours as needed. 08/11/12  Yes Tammy S Parrett, NP  sodium chloride (OCEAN) 0.65 % SOLN nasal spray Place 1 spray into the nose as needed for congestion. 07/06/12  Yes Renae Fickle, MD  Spacer/Aero-Holding Chambers (AEROCHAMBER MV) inhaler Use as instructed 05/10/12  Yes Tammy Rogers Seeds, NP    FAMILY HISTORY: Family History  Problem Relation Age of Onset  . Heart attack Mother   . Heart attack Father   . Deep vein thrombosis Father   . Cancer Brother     Pancreas and colon    SOCIAL HISTORY:  reports that she quit smoking about 3 years ago. Her smoking use included Cigarettes. She has a 20 pack-year smoking history. She has never used smokeless tobacco. She reports that she does not drink alcohol or use illicit drugs.  REVIEW OF SYSTEMS:  Constitutional:   No  weight loss, night sweats,  Fevers, chills, fatigue.  HEENT:    No headaches, Difficulty  swallowing,Tooth/dental problems,Sore throat,  No sneezing, itching, ear ache, nasal congestion, post nasal drip,   Cardio-vascular: No chest pain,  Orthopnea, PND, swelling in lower extremities, anasarca,         dizziness, palpitations  GI:  No heartburn, indigestion, abdominal pain, nausea, vomiting, diarrhea, change in       bowel habits, loss of appetite  Resp: No shortness of breath at rest.  No excess mucus, no productive cough,   No coughing up of blood.No change in color of mucus.No chest wall deformity  Skin:  no rash or lesions.  GU:  no dysuria, change in color of urine, no urgency or frequency.  No flank pain.  Musculoskeletal: No joint pain or swelling.  No decreased range of motion.  No back pain.  Psych: No change in mood or affect. No depression or anxiety.  No memory loss.   PHYSICAL EXAM: Blood pressure 75/39, pulse 75, temperature 98.3 F (36.8 C), temperature source Oral, resp. rate 16, SpO2 97.00%.  General appearance :Awake, alert, not in any distress. Speech Clear. Not toxic Looking HEENT: Atraumatic and Normocephalic, pupils equally reactive to light and accomodation Neck: supple, no JVD. No cervical lymphadenopathy.  Chest:Good air entry bilaterally, no added sounds  CVS: S1 S2 regular, no murmurs.  Abdomen: Bowel sounds present, minimal epigastric tenderness and not distended with no gaurding, rigidity or rebound. Extremities: B/L Lower Ext shows no edema, both legs are warm to touch Neurology: Awake alert, and oriented X 3, CN II-XII intact, Non focal Skin:No Rash Wounds:N/A  LABS ON ADMISSION:   Recent Labs  10/05/12 1030 10/05/12 1411  NA 142 144  K 3.5 3.7  CL 102 106  CO2 29  --   GLUCOSE 104* 102*  BUN 16 14  CREATININE 1.36* 1.20*  CALCIUM 9.2  --     Recent Labs  10/05/12 1030  AST 17  ALT 7  ALKPHOS 83  BILITOT 0.3  PROT 6.2  ALBUMIN 2.6*   No results found for this basename: LIPASE, AMYLASE,  in the last 72  hours  Recent Labs  10/05/12 1030 10/05/12 1411  WBC 6.1  --   NEUTROABS 3.9  --   HGB 9.6* 8.2*  HCT 31.6* 24.0*  MCV 87.5  --   PLT 300  --    No results found for this basename: CKTOTAL, CKMB, CKMBINDEX, TROPONINI,  in the last 72 hours No results found for this basename: DDIMER,  in the last 72 hours No components found with this basename: POCBNP,    RADIOLOGIC STUDIES ON ADMISSION: Dg Chest Port 1 View  10/05/2012  *RADIOLOGY REPORT*  Clinical Data: Cough  PORTABLE CHEST - 1 VIEW  Comparison: Prior chest x-ray 08/11/2012  Findings: Slightly increased patchy opacity in the left lung base, likely atelectasis superimposed on chronic changes.  Unchanged cardiac and mediastinal contours.  There is aortic atherosclerosis. Slightly increased pulmonary vascular congestion and diffuse prominence of the interstitial markings. Small right and probable left pleural effusions.  Left shoulder arthroplasty.  No acute osseous abnormality.  IMPRESSION:  1.  Increased pulmonary vascular congestion with diffuse prominence of interstitial markings favored to reflect mild pulmonary edema. An atypical infection including viral pneumonia could have a similar appearance.  2.  Small right to probable small left pleural effusions.   Original Report Authenticated By: Malachy Moan, M.D.     ASSESSMENT AND PLAN: Present on Admission:  . Hypotension -not sure what the exact etiology here is-Lactate is normal -since patient was hypothermic-suspect underlying infection/sepsis syndrome (?PNA) as the main culprit -BP improving with IVF, will monitor clinical course. If hypotension recurs-then will repeat Lactate and perhaps do a CT Abdomen.For now will empirically treat with Vanco and Levaquin. -check random cortisol level  .Sepsis Syndrome -with mild hypothermia and hypotension -apart for some suspicion for PNA-no other foci evident clinically -treat empirically with Vanco/Levaquin -follow blood and urine  cultures drawn in the ED  . ARF (acute renal failure) -likely pre-renal -hydrate and recheck lytes in am  .Anemia -Hb 9.6 on initial presentation -suspect anemia of Chronic disease -monitor for now  . COPD (chronic obstructive pulmonary disease), Gold D  -has  chronic SOB-with some mild worsening -will start on scheduled nebulized bronchodilators -hold off on starting solumedrol at present  . Anxiety -continue with Xanax -her husband passed away 3 months back-causing more anxiety spells   . HYPERTENSION -hold losartan and lasix  . ABDOMINAL AORTIC ANEURYSM -although she does have a h/o of endo-vascular AAA repair-Clinically it does not look like her hypotension is related to this issue-i.e Leak. -as noted above-if her hypotension were to persist-then we can certainly get a CT Abdomen-but her BP seems to be rebounding  . GERD (gastroesophageal reflux disease) -likely the cause for epigastric pain -place on PPI -prn Antacids  Further plan will depend as patient's clinical course evolves and further radiologic and laboratory data become available. Patient will be monitored closely.   DVT Prophylaxis: Prophylactic Lovenox   Code Status: DNR   Total time spent for admission equals 45 minutes.  [facility] Pager [phone]  If 7PM-7AM, please contact night-coverage [email or web address] Password [facility] 10/05/2012, 3:14 PM

## 2012-10-05 NOTE — ED Provider Notes (Signed)
History     CSN: [id number]  Arrival date & time 10/05/12  1005   First MD Initiated Contact with Patient 10/05/12 1016      Chief Complaint  Patient presents with  . upper epigastric pain   . Cough    (Consider location/radiation/quality/duration/timing/severity/associated sxs/prior treatment) HPI Comments: [name] is a 79 y.o. Female who presents for evaluation of cough and upper abdominal pain. Symptoms have been present for about one day. The cough is nonproductive. She has been wheezing. She is using her usual medications, without relief.   Level V caveat- severe illness  Patient is a 79 y.o. female presenting with cough. The history is provided by the patient.  Cough   Past Medical History  Diagnosis Date  . History of colonic polyps   . COPD (chronic obstructive pulmonary disease)   . Diverticulitis of colon   . GERD (gastroesophageal reflux disease)   . HTN (hypertension)   . Osteoarthritis   . Hyperlipemia   . Aortic aneurysm, abdominal   . Ovarian cyst, left   . Psoriasis   . Chronic respiratory failure     Uses home oxygen at 2 L  . ADRENAL MASS 05/04/2007  . GLAUCOMA 03/30/2007  . HEMORRHOIDS, INTERNAL 03/23/2008  . OSTEOPOROSIS 03/30/2007  . SCOLIOSIS 03/30/2007  . PONV (postoperative nausea and vomiting)     Past Surgical History  Procedure Laterality Date  . Kidney stone surgery    . Appendectomy    . Tonsillectomy    . Hemiarthroplasty shoulder fracture    . Cataract extraction, bilateral    . Abdominal aortic aneurysm repair    . Abdominal hysterectomy      Family History  Problem Relation Age of Onset  . Heart attack Mother   . Heart attack Father   . Deep vein thrombosis Father   . Cancer Brother     Pancreas and colon    History  Substance Use Topics  . Smoking status: Former Smoker -- 0.50 packs/day for 40 years    Types: Cigarettes    Quit date: 06/16/2009  . Smokeless tobacco: Never Used  . Alcohol Use: No    OB  History   Grav Para Term Preterm Abortions TAB SAB Ect Mult Living                  Review of Systems  Unable to perform ROS Respiratory: Positive for cough.     Allergies  Clarithromycin; Codeine; Hydrocodone-guaifenesin; Iohexol; Moxifloxacin; Penicillins; Prednisone; Simvastatin; and Sulfonamide derivatives  Home Medications   Current Outpatient Rx  Name  Route  Sig  Dispense  Refill  . acetaminophen (TYLENOL) 325 MG tablet   Oral   Take 2 tablets (650 mg total) by mouth every 6 (six) hours as needed (or Fever >/= 101).         [name] albuterol (PROVENTIL) (2.5 MG/3ML) 0.083% nebulizer solution   Nebulization   Take 2.5 mg by nebulization 4 (four) times daily.         [name] ALPRAZolam (XANAX) 0.25 MG tablet   Oral   Take 0.5 mg by mouth at bedtime.          . ALPRAZolam (XANAX) 1 MG tablet      Take 1 tablet three times a day scheduled 8am, 2pm, and 9pm for anxiety   90 tablet   5   . Alum & Mag Hydroxide-Simeth (MAGIC MOUTHWASH W/LIDOCAINE) SOLN   Oral   Take 5 mLs by mouth  4 (four) times daily as needed (pain).      0   . antiseptic oral rinse (BIOTENE) LIQD   Mouth Rinse   15 mLs by Mouth Rinse route as needed.         [name] atorvastatin (LIPITOR) 40 MG tablet   Oral   Take 40 mg by mouth daily.         . budesonide (PULMICORT) 0.25 MG/2ML nebulizer solution   Nebulization   Take 2 mLs (0.25 mg total) by nebulization daily.   60 mL      . chlorpheniramine-HYDROcodone (TUSSIONEX) 10-8 MG/5ML LQCR   Oral   Take 5 mLs by mouth every 12 (twelve) hours as needed.   140 mL   0   . Docusate Sodium (DSS) 100 MG CAPS   Oral   Take 100 mg by mouth 2 (two) times daily.         [name] doxycycline (VIBRA-TABS) 100 MG tablet   Oral   Take 100 mg by mouth every 12 (twelve) hours. Pt's on day 4 of therapy. Started on 10-01-12         . fluticasone (FLONASE) 50 MCG/ACT nasal spray   Nasal   Place 1 spray into the nose daily.         . furosemide (LASIX) 20  MG tablet   Oral   Take 1 tablet (20 mg total) by mouth daily.   30 tablet      . losartan (COZAAR) 50 MG tablet   Oral   Take 50 mg by mouth daily. Hold for sbp <  Or = 100         . morphine (MSIR) 15 MG tablet      Take 1 tablet twice a day as needed for breakthrough pain   60 tablet   0   . neomycin-bacitracin-polymyxin (NEOSPORIN) OINT   Topical   Apply 1 application topically daily.         [name] omeprazole (PRILOSEC) 20 MG capsule   Oral   Take 20 mg by mouth daily.         . potassium chloride (MICRO-K) 10 MEQ CR capsule   Oral   Take 10 mEq by mouth daily.          . simethicone (GAS-X) 80 MG chewable tablet   Oral   Chew 1 tablet (80 mg total) by mouth every 4 (four) hours as needed.   30 tablet   0   . sodium chloride (OCEAN) 0.65 % SOLN nasal spray   Nasal   Place 1 spray into the nose as needed for congestion.         [name] Spacer/Aero-Holding Chambers (AEROCHAMBER MV) inhaler      Use as instructed   1 each   0     BP 74/38  Pulse 76  Temp(Src) 97.5 F (36.4 C) (Oral)  Resp 16  SpO2 97%  Physical Exam  Nursing note and vitals reviewed. Constitutional: She appears well-developed.  Frail, elderly  HENT:  Head: Normocephalic and atraumatic.  Eyes: Conjunctivae and EOM are normal. Pupils are equal, round, and reactive to light.  Neck: Normal range of motion and phonation normal. Neck supple.  Cardiovascular: Normal rate, regular rhythm and intact distal pulses.   Pulmonary/Chest: Effort normal. She has wheezes. She exhibits no tenderness.  Bilateral wheeze and rhonchi, right worse than left. Decreased air movement bilaterally.  Abdominal: Soft. Bowel sounds are normal. She exhibits no distension. There is no tenderness.  There is no guarding.  No palpable abdominal mass or pulsatile abnormality. The abdomen is mildly distended.  Musculoskeletal: Normal range of motion. She exhibits no edema and no tenderness.  Neurological: She is alert.  She has normal strength. She exhibits normal muscle tone.  Orientated to person and place  Skin: Skin is warm and dry.  Psychiatric: She has a normal mood and affect. Her behavior is normal. Judgment and thought content normal.    ED Course  Procedures (including critical care time)   Medications  0.9 %  sodium chloride infusion (0 mLs Intravenous Stopped 10/05/12 1102)    Followed by  0.9 %  sodium chloride infusion (0 mLs Intravenous Stopped 10/05/12 1101)    Followed by  0.9 %  sodium chloride infusion (1,000 mLs Intravenous New Bag/Given 10/05/12 1334)  levofloxacin (LEVAQUIN) IVPB 750 mg (750 mg Intravenous New Bag/Given 10/05/12 1332)  sodium chloride 0.9 % bolus 500 mL (0 mLs Intravenous Stopped 10/05/12 1333)     10:50- pressure greater than 90 systolic  Filed Vitals:   10/05/12 1145 10/05/12 1200 10/05/12 1215 10/05/12 1230  BP:  86/43  74/38  Pulse: 82 80 78 76  Temp: 97.1 F (36.2 C) 97.3 F (36.3 C) 97.4 F (36.3 C) 97.5 F (36.4 C)  TempSrc:      Resp: 15 15 15 16   SpO2: 99% 97% 96% 97%   13:35- consultation -case discussed with Dr. Molli Knock, he will see the patient when necessary as consulted and recommends that she be admitted to a step down unit by medical service.  13:40- alert, calm. She continues to complain of epigastric pain.  13:45- consultation- case discussed with hospitalist, Dr. Jerral Ralph; he will admit the patient,  and asked that I write holding orders   Date: 04/02/2012  Rate: 82  Rhythm: normal sinus rhythm  QRS Axis: normal  PR and QT Intervals: normal  ST/T Wave abnormalities: normal  PR and QRS Conduction Disutrbances:none  Narrative Interpretation:   Old EKG Reviewed: changes noted- Rate slower   CRITICAL CARE Performed by: Mancel Bale L   Total critical care time: 50 min.  Critical care time was exclusive of separately billable procedures and treating other patients.  Critical care was necessary to treat or prevent imminent or  life-threatening deterioration.  Critical care was time spent personally by me on the following activities: development of treatment plan with patient and/or surrogate as well as nursing, discussions with consultants, evaluation of patient's response to treatment, examination of patient, obtaining history from patient or surrogate, ordering and performing treatments and interventions, ordering and review of laboratory studies, ordering and review of radiographic studies, pulse oximetry and re-evaluation of patient's condition.  Labs Reviewed  CBC WITH DIFFERENTIAL - Abnormal; Notable for the following:    RBC 3.61 (*)    Hemoglobin 9.6 (*)    HCT 31.6 (*)    Monocytes Relative 15 (*)    All other components within normal limits  COMPREHENSIVE METABOLIC PANEL - Abnormal; Notable for the following:    Glucose, Bld 104 (*)    Creatinine, Ser 1.36 (*)    Albumin 2.6 (*)    GFR calc non Af Amer 36 (*)    GFR calc Af Amer 42 (*)    All other components within normal limits  URINALYSIS, ROUTINE W REFLEX MICROSCOPIC - Abnormal; Notable for the following:    APPearance CLOUDY (*)    Protein, ur 30 (*)    All other components within normal limits  URINE MICROSCOPIC-ADD ON - Abnormal; Notable for the following:    Bacteria, UA FEW (*)    Casts GRANULAR CAST (*)    All other components within normal limits  CULTURE, BLOOD (ROUTINE X 2)  CULTURE, BLOOD (ROUTINE X 2)  URINE CULTURE  CG4 I-STAT (LACTIC ACID)   Dg Chest Port 1 View  10/05/2012  *RADIOLOGY REPORT*  Clinical Data: Cough  PORTABLE CHEST - 1 VIEW  Comparison: Prior chest x-ray 08/11/2012  Findings: Slightly increased patchy opacity in the left lung base, likely atelectasis superimposed on chronic changes.  Unchanged cardiac and mediastinal contours.  There is aortic atherosclerosis. Slightly increased pulmonary vascular congestion and diffuse prominence of the interstitial markings. Small right and probable left pleural effusions.  Left  shoulder arthroplasty.  No acute osseous abnormality.  IMPRESSION:  1.  Increased pulmonary vascular congestion with diffuse prominence of interstitial markings favored to reflect mild pulmonary edema. An atypical infection including viral pneumonia could have a similar appearance.  2.  Small right to probable small left pleural effusions.   Original Report Authenticated By: Malachy Moan, M.D.    Nursing Notes Reviewed/ Care Coordinated, and agree without changes. Applicable Imaging Reviewed. Radiologic imaging report reviewed and images by  radiography  - viewed, by me. Interpretation of Laboratory Data incorporated into ED treatment  1. Hypotension   2. Hypothermia, initial encounter   3. Abdominal pain, acute       MDM   Nonspecific cough, and upper abdominal pain. She is also hypothermic. There is no clear source for infection. She is partially fluid responsive to IV fluids, but remains hypotensive after 3 L. She will need to be admitted for further evaluation and treatment.   Plan: Admit    Flint Melter, MD 10/05/12 [phone]

## 2012-10-05 NOTE — ED Notes (Signed)
Pt complains of having a "cold" and and pain in the Epigastric region that started yesterday. Pt denies N/V/D. Pt also complains SOB but "its been going on; I have COPD."

## 2012-10-05 NOTE — Progress Notes (Signed)
ANTIBIOTIC CONSULT NOTE - INITIAL  Pharmacy Consult for Vancomycin, Levaquin Indication: rule out pneumonia  Allergies  Allergen Reactions  . Clarithromycin     REACTION: abd pain  . Codeine   . Hydrocodone-Guaifenesin     REACTION: feels funny  . Iohexol      Desc: pt reports dyspnea and throat swelling about age 79 w/ IV contrast reaction when kidneys were being checked; 07/25/08 SLG   . Moxifloxacin     REACTION: hallucinations  . Penicillins   . Prednisone Other (See Comments)    Can take but at a lower dosage  . Simvastatin   . Sulfonamide Derivatives     REACTION: unknown    Patient Measurements: Height: 5' (152.4 cm) Weight: 125 lb 3.5 oz (56.8 kg) IBW/kg (Calculated) : 45.5  Vital Signs: Temp: 98.3 F (36.8 C) (04/22 1425) Temp src: Oral (04/22 1019) BP: 75/39 mmHg (04/22 1425) Pulse Rate: 75 (04/22 1425)  Labs:  Recent Labs  10/05/12 1030 10/05/12 1411  WBC 6.1  --   HGB 9.6* 8.2*  PLT 300  --   CREATININE 1.36* 1.20*   Estimated Creatinine Clearance: 30 ml/min (by C-G formula based on Cr of 1.2).  Microbiology: No results found for this or any previous visit (from the past 720 hour(s)).  Medical History: Past Medical History  Diagnosis Date  . History of colonic polyps   . COPD (chronic obstructive pulmonary disease)   . Diverticulitis of colon   . GERD (gastroesophageal reflux disease)   . HTN (hypertension)   . Osteoarthritis   . Hyperlipemia   . Aortic aneurysm, abdominal   . Ovarian cyst, left   . Psoriasis   . Chronic respiratory failure     Uses home oxygen at 2 L  . ADRENAL MASS 05/04/2007  . GLAUCOMA 03/30/2007  . HEMORRHOIDS, INTERNAL 03/23/2008  . OSTEOPOROSIS 03/30/2007  . SCOLIOSIS 03/30/2007  . PONV (postoperative nausea and vomiting)     Medications:  Anti-infectives   Start     Dose/Rate Route Frequency Ordered Stop   10/05/12 1330  levofloxacin (LEVAQUIN) IVPB 750 mg     750 mg 100 mL/hr over 90 Minutes  Intravenous  Once 10/05/12 1321 10/05/12 1502     Assessment: 79 yo F with code sepsis in the ED.  Presented with cough and epigastric pain, admit 4/22 with r/o pneumonia.  PMH includes COPD on home O2, and HTN.  Pharmacy is asked to assist with antibiotic dosing.  PO Doxycycline started PTA, but symptoms worsened.  First dose of levaquin administered in ED.  SCr is elevated, CrCl ~ 30 ml/min  WBC are wnl  Initially pt is hypothermic, but has since resolved.  Goal of Therapy:  Vancomycin trough level 15-20 mcg/ml  Plan:   Levaquin 750mg  IV q48h  Vancomycin 750mg  IV q24h.  Measure Vanc trough at steady state.  Follow up renal fxn and culture results.    Lynann Beaver PharmD, BCPS Pager [phone] 10/05/2012 4:02 PM

## 2012-10-05 NOTE — ED Notes (Signed)
Report given to Tia, RN.

## 2012-10-05 NOTE — Progress Notes (Addendum)
CSW met with pt and patient sister at bedside to complete psychosocial assessment. Patient is alert and oriented to self and place. Patient sister assisted with some information for assessment. Pt and pt sister confirmed tha tpatient is a resident at [facility] [phone]). Patient sister, Concepcion Elk that patient was recently at [facility]. Pt and pt sister also shared that patient husband of 41 years recently passed after batteling cancer. Patient stated that she may be interested in speak with a clinical social worker here however wants to focus on getting better first. Patient is open to returning to [facility] or [facility] when medically stable depending upon recommending discharge plans. Patient sister shared that patient son is patient hcpoa. Patient son is [name] at [phone]. Per [facility], patient was reciving minimal assistance with bathing, and was independent with the rest of her adls. Patient recieves some assistance with medications and oxygen.  CSW awaiting further medical evaluation to determine pt disposition needs.    Catha Gosselin, LCSWA  [phone] .10/05/2012 11:11am

## 2012-10-06 ENCOUNTER — Other Ambulatory Visit (HOSPITAL_COMMUNITY): Payer: Medicare Other

## 2012-10-06 DIAGNOSIS — I714 Abdominal aortic aneurysm, without rupture, unspecified: Secondary | ICD-10-CM

## 2012-10-06 LAB — CBC
HCT: 25.4 % — ABNORMAL LOW (ref 36.0–46.0)
MCHC: 30.3 g/dL (ref 30.0–36.0)
Platelets: 188 10*3/uL (ref 150–400)
RDW: 13.3 % (ref 11.5–15.5)
WBC: 5.2 10*3/uL (ref 4.0–10.5)

## 2012-10-06 LAB — URINE CULTURE
Colony Count: NO GROWTH
Culture: NO GROWTH

## 2012-10-06 LAB — HEMOGLOBIN AND HEMATOCRIT, BLOOD
HCT: 26.5 % — ABNORMAL LOW (ref 36.0–46.0)
Hemoglobin: 8.2 g/dL — ABNORMAL LOW (ref 12.0–15.0)

## 2012-10-06 LAB — LACTIC ACID, PLASMA: Lactic Acid, Venous: 1 mmol/L (ref 0.5–2.2)

## 2012-10-06 LAB — COMPREHENSIVE METABOLIC PANEL WITH GFR
ALT: 6 U/L (ref 0–35)
AST: 15 U/L (ref 0–37)
Albumin: 2.1 g/dL — ABNORMAL LOW (ref 3.5–5.2)
Alkaline Phosphatase: 64 U/L (ref 39–117)
BUN: 12 mg/dL (ref 6–23)
CO2: 28 meq/L (ref 19–32)
Calcium: 8 mg/dL — ABNORMAL LOW (ref 8.4–10.5)
Chloride: 106 meq/L (ref 96–112)
Creatinine, Ser: 1.08 mg/dL (ref 0.50–1.10)
GFR calc Af Amer: 55 mL/min — ABNORMAL LOW
GFR calc non Af Amer: 48 mL/min — ABNORMAL LOW
Glucose, Bld: 80 mg/dL (ref 70–99)
Potassium: 3.2 meq/L — ABNORMAL LOW (ref 3.5–5.1)
Sodium: 141 meq/L (ref 135–145)
Total Bilirubin: 0.1 mg/dL — ABNORMAL LOW (ref 0.3–1.2)
Total Protein: 4.8 g/dL — ABNORMAL LOW (ref 6.0–8.3)

## 2012-10-06 MED ORDER — POTASSIUM CHLORIDE CRYS ER 20 MEQ PO TBCR
40.0000 meq | EXTENDED_RELEASE_TABLET | ORAL | Status: AC
  Administered 2012-10-06 (×2): 40 meq via ORAL
  Filled 2012-10-06 (×2): qty 2

## 2012-10-06 MED ORDER — VANCOMYCIN HCL 500 MG IV SOLR
500.0000 mg | Freq: Two times a day (BID) | INTRAVENOUS | Status: DC
  Administered 2012-10-06 – 2012-10-08 (×6): 500 mg via INTRAVENOUS
  Filled 2012-10-06 (×8): qty 500

## 2012-10-06 MED ORDER — METHYLPREDNISOLONE 16 MG PO TABS
16.0000 mg | ORAL_TABLET | Freq: Two times a day (BID) | ORAL | Status: AC
  Administered 2012-10-06 – 2012-10-07 (×2): 16 mg via ORAL
  Filled 2012-10-06 (×2): qty 1

## 2012-10-06 MED ORDER — DIPHENHYDRAMINE HCL 50 MG PO CAPS
50.0000 mg | ORAL_CAPSULE | Freq: Once | ORAL | Status: AC
  Administered 2012-10-07: 50 mg via ORAL
  Filled 2012-10-06: qty 1

## 2012-10-06 MED ORDER — TRAMADOL HCL 50 MG PO TABS
25.0000 mg | ORAL_TABLET | Freq: Four times a day (QID) | ORAL | Status: DC | PRN
  Administered 2012-10-06 – 2012-10-08 (×6): 50 mg via ORAL
  Filled 2012-10-06 (×5): qty 1
  Filled 2012-10-06: qty 2
  Filled 2012-10-06: qty 1

## 2012-10-06 MED ORDER — ALBUTEROL SULFATE (5 MG/ML) 0.5% IN NEBU
2.5000 mg | INHALATION_SOLUTION | RESPIRATORY_TRACT | Status: DC | PRN
  Administered 2012-10-07 – 2012-10-13 (×3): 2.5 mg via RESPIRATORY_TRACT
  Filled 2012-10-06 (×3): qty 0.5

## 2012-10-06 NOTE — Progress Notes (Signed)
PT Cancellation Note  Patient Details Name: MANMEET ARZOLA MRN: [id number] DOB: [birth year]   Cancelled Treatment:     Order received. Chart reviewed. Attempted PT eval-pt declined to participate due to fatigue. Will check back another day. Thanks.    Rebeca Alert, MPT Pager: [phone]

## 2012-10-06 NOTE — Progress Notes (Addendum)
Paged [facility] NP concerning pt hypotension and Hgb of 7.7. Pt is arousable, A/Ox4. NP aware and looking over vitals. Will continue to monitor.  Langley Gauss, RN

## 2012-10-06 NOTE — Progress Notes (Signed)
CARE MANAGEMENT NOTE 10/06/2012  Patient:  [name], [name]   Account Number:  [id number]  Date Initiated:  10/06/2012  Documentation initiated by:  Kally Cadden  Subjective/Objective Assessment:   hx of copd, hypotensiver state, hypoxia, smoker quit 3 years ago.     Action/Plan:   home when stable may need o2   Anticipated DC Date:  10/09/2012   Anticipated DC Plan:  HOME/SELF CARE  In-house referral  NA      DC Planning Services  NA      PAC Choice  NA   Choice offered to / List presented to:  NA   DME arranged  NA      DME agency  NA     HH arranged  NA      HH agency  NA   Status of service:  In process, will continue to follow Medicare Important Message given?  NA - LOS <3 / Initial given by admissions (If response is "NO", the following Medicare IM given date fields will be blank) Date Medicare IM given:   Date Additional Medicare IM given:    Discharge Disposition:    Per UR Regulation:  Reviewed for med. necessity/level of care/duration of stay  If discussed at Long Length of Stay Meetings, dates discussed:    Comments:  [id number] Earlene Plater, RN, BSN, CCM:  CHART REVIEWED AND UPDATED.  Next chart review due on [id number]. NO DISCHARGE NEEDS PRESENT AT THIS TIME. CASE MANAGEMENT [id number]

## 2012-10-06 NOTE — Progress Notes (Addendum)
Patient ID: [name], female   DOB: August 07, 1930, 79 y.o.   MRN: [id number]  Pt scheduled for CTA with contrast per Dr Donna Bernard Pt allergic to contrast dye (thraot swelling) Pt allergic to Prednisone (n/v)  Orders now for Medrol 16 mg 1 po at 7pm tonight 1 po at 7 am 4/24 Also Benadryl 50 mg 1 po at 7 am 4/24  Please see orders in computer

## 2012-10-06 NOTE — Progress Notes (Signed)
[facility] PROGRESS NOTE  [name] ZOX:[id number] DOB: 1930/08/22 DOA: 10/05/2012 PCP: [name], MD  Assessment/Plan: . Hypotension -Unclear etiology, possibly secondary to infection/pneumonia given the patient was hypothermic as well, but Lactate is normal  -Improved with IV fluids and empiric antibiotics- Vanco and Levaquin. We'll decrease IV fluids (as BNP mildly elevated) continue to monitor and further manage as appropriate. -Await CT angiogram of the abdomen ordered to evaluate for possible aortic leak given her history of endovascular AAA repair -She has a chest and prednisone allergies so she'll be premedicated with alternative meds per radiology prior to CT angiogram-follow. -random cortisol level normal. U. A- and urine cultures so far no growth . Probable pneumonia  withSepsis Syndrome  -As discussed above, continue empiric antibiotics -Blood cultures with no growth so far.  . ARF (acute renal failure)  -likely pre-renal  -hydrate and recheck lytes in am  .Anemia  -Hb 9.6>> 7.7 this a.m., with hydration -Follow and transfuse as above, await CTA to eval for leak as above -suspect anemia of Chronic disease  -monitor for now  Hypokalemia -Replace K.  . COPD (chronic obstructive pulmonary disease), Gold D  -has chronic SOB-with some mild worsening  -Continue on scheduled nebulized bronchodilators, add when necessary  -Holding off solumedrol for now given her reported history of prednisone allergy, follow  . Anxiety  -continue with Xanax   . HYPERTENSION  -continue holding losartan and lasix for now secondary to #1  . ABDOMINAL AORTIC ANEURYSM  -Has a h/o of endo-vascular AAA repair -Follow up on CT angiogram of abdomen ordered last p.m.4/22 evaluate for leak as above, although patient appears nontoxic  . GERD (gastroesophageal reflux disease)  -likely the cause for epigastric pain  -continue on PPI  -prn Antacids      Code Status:DNR Family  Communication: No family present Disposition Plan: pending clinical course   Consultants:  none  Procedures:  CTA  abdomen pending  Antibiotics:  Vanc and levaquin  HPI/Subjective: [name] some epigastric pain, states her breathing better but per nursing short of breath with minimal activity  Objective: Filed Vitals:   10/06/12 0519 10/06/12 0600 10/06/12 0700 10/06/12 0827  BP: 115/62 106/47 110/45   Pulse: 79 80 80   Temp: 97.3 F (36.3 C) 97.7 F (36.5 C) 97.9 F (36.6 C)   TempSrc:      Resp: 15 16 19    Height:      Weight:      SpO2: 100% 100% 98% 98%    Intake/Output Summary (Last 24 hours) at 10/06/12 0904 Last data filed at 10/06/12 0600  Gross per 24 hour  Intake 1668.33 ml  Output   1200 ml  Net 468.33 ml   Filed Weights   10/05/12 1546  Weight: 56.8 kg (125 lb 3.5 oz)    Exam:   General:  Elderly female, alert and appropriate in no apparent distress. Nontoxic appearing  Cardiovascular: Regular rate and rhythm, normal S1-S2  Respiratory: Scattered expiratory wheezes, decreased breath sounds at the  Abdomen: Soft, bowel sounds present, epigastric tenderness present, nondistended, no guarding and no masses palpable.  Extremities: No cyanosis and no edema  Data Reviewed: Basic Metabolic Panel:  Recent Labs Lab 10/05/12 1030 10/05/12 1411 10/06/12 0215  NA 142 144 141  K 3.5 3.7 3.2*  CL 102 106 106  CO2 29  --  28  GLUCOSE 104* 102* 80  BUN 16 14 12   CREATININE 1.36* 1.20* 1.08  CALCIUM 9.2  --  8.0*   Liver Function Tests:  Recent Labs Lab 10/05/12 1030 10/06/12 0215  AST 17 15  ALT 7 6  ALKPHOS 83 64  BILITOT 0.3 0.1*  PROT 6.2 4.8*  ALBUMIN 2.6* 2.1*   No results found for this basename: LIPASE, AMYLASE,  in the last 168 hours No results found for this basename: AMMONIA,  in the last 168 hours CBC:  Recent Labs Lab 10/05/12 1030 10/05/12 1411 10/06/12 0215  WBC 6.1  --  5.2  NEUTROABS 3.9  --   --   HGB 9.6*  8.2* 7.7*  HCT 31.6* 24.0* 25.4*  MCV 87.5  --  87.0  PLT 300  --  188   Cardiac Enzymes:  Recent Labs Lab 10/05/12 1955 10/06/12 0215  TROPONINI <0.30 <0.30   BNP (last 3 results)  Recent Labs  05/21/12 0506 07/03/12 0630  PROBNP 383.1 674.3*   CBG: No results found for this basename: GLUCAP,  in the last 168 hours  Recent Results (from the past 240 hour(s))  CULTURE, BLOOD (ROUTINE X 2)     Status: None   Collection Time    10/05/12 10:30 AM      Result Value Range Status   Specimen Description BLOOD RIGHT ANTECUBITAL   Final   Special Requests BOTTLES DRAWN AEROBIC AND ANAEROBIC   Final   Culture  Setup Time 10/05/2012 13:59   Final   Culture     Final   Value:        BLOOD CULTURE RECEIVED NO GROWTH TO DATE CULTURE WILL BE HELD FOR 5 DAYS BEFORE ISSUING A FINAL NEGATIVE REPORT   Report Status PENDING   Incomplete  CULTURE, BLOOD (ROUTINE X 2)     Status: None   Collection Time    10/05/12 10:35 AM      Result Value Range Status   Specimen Description BLOOD RIGHT FOREARM   Final   Special Requests BOTTLES DRAWN AEROBIC ONLY   Final   Culture  Setup Time 10/05/2012 13:59   Final   Culture     Final   Value:        BLOOD CULTURE RECEIVED NO GROWTH TO DATE CULTURE WILL BE HELD FOR 5 DAYS BEFORE ISSUING A FINAL NEGATIVE REPORT   Report Status PENDING   Incomplete  MRSA PCR SCREENING     Status: None   Collection Time    10/05/12  3:32 PM      Result Value Range Status   MRSA by PCR NEGATIVE  NEGATIVE Final   Comment:            The GeneXpert MRSA Assay (FDA     approved for NASAL specimens     only), is one component of a     comprehensive MRSA colonization     surveillance program. It is not     intended to diagnose MRSA     infection nor to guide or     monitor treatment for     MRSA infections.     Studies: Dg Chest Port 1 View  10/05/2012  *RADIOLOGY REPORT*  Clinical Data: Cough  PORTABLE CHEST - 1 VIEW  Comparison: Prior chest x-ray  08/11/2012  Findings: Slightly increased patchy opacity in the left lung base, likely atelectasis superimposed on chronic changes.  Unchanged cardiac and mediastinal contours.  There is aortic atherosclerosis. Slightly increased pulmonary vascular congestion and diffuse prominence of the interstitial markings. Small right and probable left pleural effusions.  Left shoulder arthroplasty.  No acute osseous abnormality.  IMPRESSION:  1.  Increased pulmonary vascular congestion with diffuse prominence of interstitial markings favored to reflect mild pulmonary edema. An atypical infection including viral pneumonia could have a similar appearance.  2.  Small right to probable small left pleural effusions.   Original Report Authenticated By: Malachy Moan, M.D.    Dg Abd Portable 2v  10/05/2012  *RADIOLOGY REPORT*  Clinical Data: Epigastric abdominal pain  PORTABLE ABDOMEN - 2 VIEW  Comparison: Chest film of earlier in the day.  Abdominal CT 08/21/2009.  Findings: Supine and right-side up decubitus views.  The supine view demonstrates moderate convex left lumbar spine curvature. Rectal thermistor in place.  Aortic/iliac Endograft.  Moderate stool throughout the colon.  No gaseous distention of small bowel loops.  Right-sided decubitus view demonstrates no evidence of free intraperitoneal air. No significant air fluid levels.  IMPRESSION: No acute findings.  Possible constipation.   Original Report Authenticated By: Jeronimo Greaves, M.D.     Scheduled Meds: . albuterol  2.5 mg Nebulization Q6H  . ALPRAZolam  0.5 mg Oral QHS  . budesonide  0.25 mg Nebulization BID  . enoxaparin (LOVENOX) injection  40 mg Subcutaneous Q24H  . fluticasone  1 spray Each Nare Daily  . ipratropium  0.5 mg Nebulization Q6H  . [START ON 10/07/2012] levofloxacin (LEVAQUIN) IV  750 mg Intravenous Q48H  . pantoprazole (PROTONIX) IV  40 mg Intravenous Q24H  . sodium chloride  3 mL Intravenous Q12H  . vancomycin  750 mg Intravenous Q24H    Continuous Infusions: . sodium chloride 100 mL/hr at 10/05/12 1549    Principal Problem:   Hypotension Active Problems:   HYPERTENSION   ABDOMINAL AORTIC ANEURYSM   COPD (chronic obstructive pulmonary disease), Gold D    GERD (gastroesophageal reflux disease)   Anxiety   ARF (acute renal failure)    Time spent    [facility]  [facility] Pager [phone]. If 7PM-7AM, please contact night-coverage at [email or web address], password [facility] 10/06/2012, 9:04 AM  LOS: 1 day

## 2012-10-07 ENCOUNTER — Inpatient Hospital Stay (HOSPITAL_COMMUNITY): Payer: Medicare Other

## 2012-10-07 DIAGNOSIS — I714 Abdominal aortic aneurysm, without rupture: Secondary | ICD-10-CM

## 2012-10-07 DIAGNOSIS — R109 Unspecified abdominal pain: Secondary | ICD-10-CM

## 2012-10-07 DIAGNOSIS — J189 Pneumonia, unspecified organism: Secondary | ICD-10-CM

## 2012-10-07 DIAGNOSIS — K5900 Constipation, unspecified: Secondary | ICD-10-CM | POA: Diagnosis present

## 2012-10-07 LAB — CBC
Platelets: 242 10*3/uL (ref 150–400)
RBC: 3.34 MIL/uL — ABNORMAL LOW (ref 3.87–5.11)
RDW: 13.1 % (ref 11.5–15.5)
WBC: 3.1 10*3/uL — ABNORMAL LOW (ref 4.0–10.5)

## 2012-10-07 LAB — BASIC METABOLIC PANEL
CO2: 25 mEq/L (ref 19–32)
Chloride: 107 mEq/L (ref 96–112)
Creatinine, Ser: 0.93 mg/dL (ref 0.50–1.10)
GFR calc Af Amer: 66 mL/min — ABNORMAL LOW (ref >90)
Potassium: 4.3 mEq/L (ref 3.5–5.1)
Sodium: 140 mEq/L (ref 135–145)

## 2012-10-07 MED ORDER — IOHEXOL 350 MG/ML SOLN
100.0000 mL | Freq: Once | INTRAVENOUS | Status: AC | PRN
  Administered 2012-10-07: 100 mL via INTRAVENOUS

## 2012-10-07 MED ORDER — POLYETHYLENE GLYCOL 3350 17 G PO PACK
17.0000 g | PACK | Freq: Two times a day (BID) | ORAL | Status: DC
  Administered 2012-10-07 – 2012-10-13 (×4): 17 g via ORAL
  Filled 2012-10-07 (×15): qty 1

## 2012-10-07 MED ORDER — PANTOPRAZOLE SODIUM 40 MG PO TBEC
40.0000 mg | DELAYED_RELEASE_TABLET | Freq: Every day | ORAL | Status: DC
  Administered 2012-10-08 – 2012-10-11 (×4): 40 mg via ORAL
  Filled 2012-10-07 (×6): qty 1

## 2012-10-07 MED ORDER — CHLORHEXIDINE GLUCONATE 0.12 % MT SOLN
15.0000 mL | Freq: Two times a day (BID) | OROMUCOSAL | Status: DC
  Administered 2012-10-07 – 2012-10-13 (×11): 15 mL via OROMUCOSAL
  Filled 2012-10-07 (×16): qty 15

## 2012-10-07 MED ORDER — SORBITOL 70 % SOLN
30.0000 mL | Freq: Every day | Status: DC | PRN
  Filled 2012-10-07: qty 30

## 2012-10-07 MED ORDER — BENZONATATE 100 MG PO CAPS
100.0000 mg | ORAL_CAPSULE | Freq: Three times a day (TID) | ORAL | Status: DC
  Administered 2012-10-07 – 2012-10-10 (×10): 100 mg via ORAL
  Filled 2012-10-07 (×13): qty 1

## 2012-10-07 MED ORDER — METHYLPREDNISOLONE SODIUM SUCC 125 MG IJ SOLR
60.0000 mg | Freq: Three times a day (TID) | INTRAMUSCULAR | Status: DC
  Administered 2012-10-07 – 2012-10-08 (×3): 60 mg via INTRAVENOUS
  Filled 2012-10-07 (×6): qty 0.96

## 2012-10-07 MED ORDER — FLEET ENEMA 7-19 GM/118ML RE ENEM: 1.0000 | ENEMA | Freq: Every day | RECTAL | Status: DC | PRN

## 2012-10-07 MED ORDER — SENNOSIDES-DOCUSATE SODIUM 8.6-50 MG PO TABS
1.0000 | ORAL_TABLET | Freq: Every day | ORAL | Status: DC
  Administered 2012-10-07 – 2012-10-11 (×4): 1 via ORAL
  Filled 2012-10-07 (×8): qty 1

## 2012-10-07 MED ORDER — BIOTENE DRY MOUTH MT LIQD
15.0000 mL | Freq: Two times a day (BID) | OROMUCOSAL | Status: DC
  Administered 2012-10-07 – 2012-10-13 (×11): 15 mL via OROMUCOSAL

## 2012-10-07 NOTE — Evaluation (Signed)
Physical Therapy Evaluation Patient Details Name: [name] MRN: [id number] DOB: [birth year] Today's Date: 10/07/2012 Time: 4098-1191 PT Time Calculation (min): 31 min  PT Assessment / Plan / Recommendation Clinical Impression  79 yo female admitted with hypotension. Hx of COPD. Pt is from ALF-was ambulatory with O2, without assistive device PTA. On eval, pt required Min assist for mobility-able to ambulate ~50 feet with RW. Demonstrates general weakness and decreased activity tolerance. Recommend SNF unless ALF is able to provide current level of care, increased supervision, and HHPT.     PT Assessment  Patient needs continued PT services    Follow Up Recommendations  SNF. (if pt agreeable; unless ALF is able to provide current level of care, increased supervision, HHPT). Also depends on pt progress.    Does the patient have the potential to tolerate intense rehabilitation      Barriers to Discharge        Equipment Recommendations  Rolling walker with 5" wheels    Recommendations for Other Services OT consult   Frequency Min 3X/week    Precautions / Restrictions Precautions Precautions: Fall Restrictions Weight Bearing Restrictions: No   Pertinent Vitals/Pain At rest: 79 bpm, 97% 3L, 133/62 During activity: 124 bpm, 85% 3L  End of session: 78 bpm, 97% 3L      Mobility  Bed Mobility Bed Mobility: Supine to Sit Supine to Sit: 5: Supervision;HOB elevated Details for Bed Mobility Assistance: Supervision for safety/lines Transfers Transfers: Sit to Stand;Stand to Sit Sit to Stand: 4: Min guard;From bed Stand to Sit: 4: Min guard;To chair/3-in-1 Details for Transfer Assistance: VCs safety, technique, hand placement. Unsteady.  Ambulation/Gait Ambulation/Gait Assistance: 4: Min assist Ambulation Distance (Feet): 50 Feet Assistive device: Rolling walker Ambulation/Gait Assistance Details: VCS safety, distance from RW, pacing. Assist to stabilize pt throughout  ambulation and to maneuver with RW.  Gait Pattern: Step-through pattern;Decreased stride length    Exercises     PT Diagnosis: Difficulty walking;Generalized weakness  PT Problem List: Decreased strength;Decreased activity tolerance;Decreased balance;Decreased mobility;Decreased knowledge of use of DME PT Treatment Interventions: DME instruction;Gait training;Functional mobility training;Therapeutic activities;Therapeutic exercise;Patient/family education   PT Goals Acute Rehab PT Goals PT Goal Formulation: With patient Time For Goal Achievement: 10/21/12 Potential to Achieve Goals: Good Pt will go Supine/Side to Sit: with modified independence PT Goal: Supine/Side to Sit - Progress: Goal set today Pt will go Sit to Supine/Side: with modified independence PT Goal: Sit to Supine/Side - Progress: Goal set today Pt will go Sit to Stand: with supervision PT Goal: Sit to Stand - Progress: Goal set today Pt will Ambulate: 51 - 150 feet;with supervision;with least restrictive assistive device PT Goal: Ambulate - Progress: Goal set today  Visit Information  Last PT Received On: 10/07/12 Assistance Needed: +1    Subjective Data  Subjective: I think it was a mistake when they sent me to that place last time Patient Stated Goal: Get better. Back to ALF   Prior Functioning  Home Living Type of Home: Assisted living Home Access: Level entry Home Layout: One level Home Adaptive Equipment: None Prior Function Level of Independence: Needs assistance Needs Assistance: Bathing;Dressing;Meal Prep Bath: Moderate Dressing: Moderate Meal Prep: Total Able to Take Stairs?: No Driving: No Comments: Pt states she was ambulatory without assistive device Communication Communication: No difficulties    Cognition  Cognition Arousal/Alertness: Awake/[name] Behavior During Therapy: WFL for tasks assessed/performed Overall Cognitive Status: Within Functional Limits for tasks assessed     Extremity/Trunk Assessment Right Lower Extremity Assessment  RLE ROM/Strength/Tone: Deficits RLE ROM/Strength/Tone Deficits: Strength at least 3+/5 with functional activity Left Lower Extremity Assessment LLE ROM/Strength/Tone: Deficits LLE ROM/Strength/Tone Deficits: Strength at least 3+/5 with functional activity Trunk Assessment Trunk Assessment: Normal   Balance Balance Balance Assessed: Yes Static Standing Balance Static Standing - Balance Support: No upper extremity supported Static Standing - Level of Assistance: 5: Stand by assistance Static Standing - Comment/# of Minutes: Pt unsteady. Very close guarding  End of Session PT - End of Session Equipment Utilized During Treatment: Gait belt Activity Tolerance: Patient limited by fatigue Patient left: in chair;with call bell/phone within reach  GP     [name], MPT Pager: [phone]

## 2012-10-07 NOTE — Clinical Social Work Note (Signed)
CSW continues to follow for possible SNF or ALF. Pt was at ALF, [facility] for 10 days prior to admission. Prior to that Pt was at [facility] and may need SNF again. CSW will update FL2 once PT makes rec for dispo. CSW to follow and assist with whatever the plan may be.  Doreen Salvage, LCSW ICU/Stepdown Clinical Social Worker [facility] Cell [phone] Hours 8am-1200pm M-F

## 2012-10-07 NOTE — Progress Notes (Signed)
Patient evaluated for long-term disease management services with [facility] Care Management Program as a benefit of her [employer] insurance. However, it appears patient is from ALF and may go to SNF at discharge. [facility] Care Management not appropriate at this time.  Raiford Noble, MSN-Ed, RN,BSN, [facility], [phone]

## 2012-10-07 NOTE — Progress Notes (Signed)
[facility] PROGRESS NOTE  [name] ZOX:[id number] DOB: 1931-05-30 DOA: 10/05/2012 PCP: [name], MD  Assessment/Plan: . Hypotension -Unclear etiology, possibly secondary to infection/pneumonia given the patient was hypothermic as well, but Lactate is normal  -Improved with IV fluids and empiric antibiotics- Vanco and Levaquin. Will NSL IV fluids (as BNP mildly elevated) continue to monitor and further manage as appropriate. -Await CT angiogram of the abdomen ordered to evaluate for possible aortic leak given her history of endovascular AAA repair -She has a chest and prednisone allergies so she'll be premedicated with alternative meds per radiology prior to CT angiogram-follow. -random cortisol level normal. U. A- and urine cultures so far no growth . Probable pneumonia  withSepsis Syndrome  -As discussed above, sputum with clear mucus. Continue empiric antibiotics of vancomycin and levaquin. Will add tessalon perles.  -Blood cultures with no growth so far.  . ARF (acute renal failure)  -likely pre-renal. Improving with IVF. -hydrate and recheck lytes in am  .Anemia  -Hb 9.6>> 7.7 >> 8.7 this a.m., with hydration -Follow and transfuse as above, await CTA to eval for leak as above -suspect anemia of Chronic disease  -monitor for now  Hypokalemia -Repleted.  . Acute mild COPD (chronic obstructive pulmonary disease), Gold D  -has chronic SOB-with some mild worsening  -Continue on scheduled nebulized bronchodilators. -Will add IV solumedrol 60mg  q 8. . Anxiety  -continue with Xanax   . HYPERTENSION  -continue holding losartan and lasix for now secondary to #1 NSL IVF.  [name] ABDOMINAL AORTIC ANEURYSM  -Has a h/o of endo-vascular AAA repair -Follow up on CT angiogram of abdomen pending to evaluate for leak as above, although patient appears nontoxic  . GERD (gastroesophageal reflux disease)  -likely the cause for epigastric pain  -continue on PPI  -prn Antacids   Constipation Per abdominal xray. Patient with good BM this morning. Will place on miralax BID and senokot and follow. Abdominal Pain ??etiology. Clinical iomprovement. Maybe secondary to GERD vs constipation vs aneurysmal. CT angio abdomen pending. Follow. Supportive treatment.     Code Status:DNR Family Communication: No family present Disposition Plan: pending clinical course   Consultants:  none  Procedures:  CTA  abdomen 10/07/12  Antibiotics:  Vanc and levaquin 10/05/12  HPI/Subjective: Patient states epigastric pain improving, states her breathing better but per nursing short of breath with minimal activity  Objective: Filed Vitals:   10/07/12 0013 10/07/12 0200 10/07/12 0349 10/07/12 0831  BP:  148/70 158/75   Pulse:  91 94   Temp:  99.3 F (37.4 C) 99.5 F (37.5 C)   TempSrc:      Resp:  24 16   Height:      Weight:   60 kg (132 lb 4.4 oz)   SpO2: 98% 98% 100% 97%    Intake/Output Summary (Last 24 hours) at 10/07/12 0843 Last data filed at 10/07/12 0525  Gross per 24 hour  Intake   1703 ml  Output    898 ml  Net    805 ml   Filed Weights   10/05/12 1546 10/07/12 0349  Weight: 56.8 kg (125 lb 3.5 oz) 60 kg (132 lb 4.4 oz)    Exam:   General:  Elderly female, alert and appropriate in no apparent distress. Nontoxic appearing  Cardiovascular: Regular rate and rhythm, normal S1-S2  Respiratory: Scattered expiratory wheezes, decreased breath sounds at the bases.  Abdomen: Soft, bowel sounds present, decreased epigastric tenderness present, nondistended, no guarding and no masses  palpable.  Extremities: No cyanosis and no edema  Data Reviewed: Basic Metabolic Panel:  Recent Labs Lab 10/05/12 1030 10/05/12 1411 10/06/12 0215 10/07/12 0346  NA 142 144 141 140  K 3.5 3.7 3.2* 4.3  CL 102 106 106 107  CO2 29  --  28 25  GLUCOSE 104* 102* 80 133*  BUN 16 14 12 9   CREATININE 1.36* 1.20* 1.08 0.93  CALCIUM 9.2  --  8.0* 8.7   Liver  Function Tests:  Recent Labs Lab 10/05/12 1030 10/06/12 0215  AST 17 15  ALT 7 6  ALKPHOS 83 64  BILITOT 0.3 0.1*  PROT 6.2 4.8*  ALBUMIN 2.6* 2.1*   No results found for this basename: LIPASE, AMYLASE,  in the last 168 hours No results found for this basename: AMMONIA,  in the last 168 hours CBC:  Recent Labs Lab 10/05/12 1030 10/05/12 1411 10/06/12 0215 10/06/12 2000 10/07/12 0346  WBC 6.1  --  5.2  --  3.1*  NEUTROABS 3.9  --   --   --   --   HGB 9.6* 8.2* 7.7* 8.2* 8.7*  HCT 31.6* 24.0* 25.4* 26.5* 28.6*  MCV 87.5  --  87.0  --  85.6  PLT 300  --  188  --  242   Cardiac Enzymes:  Recent Labs Lab 10/05/12 1955 10/06/12 0215  TROPONINI <0.30 <0.30   BNP (last 3 results)  Recent Labs  05/21/12 0506 07/03/12 0630  PROBNP 383.1 674.3*   CBG: No results found for this basename: GLUCAP,  in the last 168 hours  Recent Results (from the past 240 hour(s))  CULTURE, BLOOD (ROUTINE X 2)     Status: None   Collection Time    10/05/12 10:30 AM      Result Value Range Status   Specimen Description BLOOD RIGHT ANTECUBITAL   Final   Special Requests BOTTLES DRAWN AEROBIC AND ANAEROBIC   Final   Culture  Setup Time 10/05/2012 13:59   Final   Culture     Final   Value:        BLOOD CULTURE RECEIVED NO GROWTH TO DATE CULTURE WILL BE HELD FOR 5 DAYS BEFORE ISSUING A FINAL NEGATIVE REPORT   Report Status PENDING   Incomplete  CULTURE, BLOOD (ROUTINE X 2)     Status: None   Collection Time    10/05/12 10:35 AM      Result Value Range Status   Specimen Description BLOOD RIGHT FOREARM   Final   Special Requests BOTTLES DRAWN AEROBIC ONLY   Final   Culture  Setup Time 10/05/2012 13:59   Final   Culture     Final   Value:        BLOOD CULTURE RECEIVED NO GROWTH TO DATE CULTURE WILL BE HELD FOR 5 DAYS BEFORE ISSUING A FINAL NEGATIVE REPORT   Report Status PENDING   Incomplete  URINE CULTURE     Status: None   Collection Time    10/05/12 11:02 AM      Result  Value Range Status   Specimen Description URINE, CATHETERIZED   Final   Special Requests NONE   Final   Culture  Setup Time 10/05/2012 14:35   Final   Colony Count NO GROWTH   Final   Culture NO GROWTH   Final   Report Status 10/06/2012 FINAL   Final  MRSA PCR SCREENING     Status: None   Collection Time  10/05/12  3:32 PM      Result Value Range Status   MRSA by PCR NEGATIVE  NEGATIVE Final   Comment:            The GeneXpert MRSA Assay (FDA     approved for NASAL specimens     only), is one component of a     comprehensive MRSA colonization     surveillance program. It is not     intended to diagnose MRSA     infection nor to guide or     monitor treatment for     MRSA infections.     Studies: Dg Chest Port 1 View  10/05/2012  *RADIOLOGY REPORT*  Clinical Data: Cough  PORTABLE CHEST - 1 VIEW  Comparison: Prior chest x-ray 08/11/2012  Findings: Slightly increased patchy opacity in the left lung base, likely atelectasis superimposed on chronic changes.  Unchanged cardiac and mediastinal contours.  There is aortic atherosclerosis. Slightly increased pulmonary vascular congestion and diffuse prominence of the interstitial markings. Small right and probable left pleural effusions.  Left shoulder arthroplasty.  No acute osseous abnormality.  IMPRESSION:  1.  Increased pulmonary vascular congestion with diffuse prominence of interstitial markings favored to reflect mild pulmonary edema. An atypical infection including viral pneumonia could have a similar appearance.  2.  Small right to probable small left pleural effusions.   Original Report Authenticated By: Malachy Moan, M.D.    Dg Abd Portable 2v  10/05/2012  *RADIOLOGY REPORT*  Clinical Data: Epigastric abdominal pain  PORTABLE ABDOMEN - 2 VIEW  Comparison: Chest film of earlier in the day.  Abdominal CT 08/21/2009.  Findings: Supine and right-side up decubitus views.  The supine view demonstrates moderate convex left lumbar spine  curvature. Rectal thermistor in place.  Aortic/iliac Endograft.  Moderate stool throughout the colon.  No gaseous distention of small bowel loops.  Right-sided decubitus view demonstrates no evidence of free intraperitoneal air. No significant air fluid levels.  IMPRESSION: No acute findings.  Possible constipation.   Original Report Authenticated By: Jeronimo Greaves, M.D.     Scheduled Meds: . albuterol  2.5 mg Nebulization Q6H  . ALPRAZolam  0.5 mg Oral QHS  . antiseptic oral rinse  15 mL Mouth Rinse q12n4p  . budesonide  0.25 mg Nebulization BID  . chlorhexidine  15 mL Mouth Rinse BID  . diphenhydrAMINE  50 mg Oral Once  . enoxaparin (LOVENOX) injection  40 mg Subcutaneous Q24H  . fluticasone  1 spray Each Nare Daily  . ipratropium  0.5 mg Nebulization Q6H  . levofloxacin (LEVAQUIN) IV  750 mg Intravenous Q48H  . methylPREDNISolone (SOLU-MEDROL) injection  60 mg Intravenous Q8H  . pantoprazole (PROTONIX) IV  40 mg Intravenous Q24H  . polyethylene glycol  17 g Oral BID  . senna-docusate  1 tablet Oral QHS  . sodium chloride  3 mL Intravenous Q12H  . vancomycin  500 mg Intravenous Q12H   Continuous Infusions: . sodium chloride 50 mL/hr at 10/07/12 0500    Principal Problem:   Hypotension Active Problems:   HYPERTENSION   ABDOMINAL AORTIC ANEURYSM   COPD (chronic obstructive pulmonary disease), Gold D    GERD (gastroesophageal reflux disease)   HTN (hypertension)   Chronic respiratory failure   Anxiety   ARF (acute renal failure)   CAP (community acquired pneumonia)   Unspecified constipation    Time spent: >    [facility]  [facility] Pager [phone]. If 7PM-7AM, please contact night-coverage at [email or web address],  password [facility] 10/07/2012, 8:43 AM  LOS: 2 days

## 2012-10-07 NOTE — Consult Note (Signed)
[facility] [place]  Referred by:  Dr. [name]  Reason for referral: endoleak on CTA  History of Present Illness  [name] is a 79 y.o. (August 21, 1930) female s/p EVAR (11/01/08) who presents with chief complaint: epigastric pain.  Patient presented with abdominal pain in ED 2 days ago with sx c/w GERD: no radiation, mild-moderate intensity, intermittent, no triggers and better with tylenol.  After CTA yesterday, pt notes the pain went away.  She has been regularly following up with Dr. Hart Rochester for her endograft surveillance and suprarenal aneurysmal segment.  The patient denies any food fear, melena, hematochezia, or hematemesis.  She denies any fever or chills or nausea and vomiting.  She was initially admitted to ICU/SD for management of low grade sepsis, with SBP in 80-90s.   Past Medical History  Diagnosis Date  . History of colonic polyps   . COPD (chronic obstructive pulmonary disease)   . Diverticulitis of colon   . GERD (gastroesophageal reflux disease)   . HTN (hypertension)   . Osteoarthritis   . Hyperlipemia   . Aortic aneurysm, abdominal   . Ovarian cyst, left   . Psoriasis   . Chronic respiratory failure     Uses home oxygen at 2 L  . ADRENAL MASS 05/04/2007  . GLAUCOMA 03/30/2007  . HEMORRHOIDS, INTERNAL 03/23/2008  . OSTEOPOROSIS 03/30/2007  . SCOLIOSIS 03/30/2007  . PONV (postoperative nausea and vomiting)     Past Surgical History  Procedure Laterality Date  . Kidney stone surgery    . Appendectomy    . Tonsillectomy    . Hemiarthroplasty shoulder fracture    . Cataract extraction, bilateral    . Abdominal aortic aneurysm repair    . Abdominal hysterectomy      History   Social History  . Marital Status: Married    Spouse Name: Richard    Number of Children: 2  . Years of Education: 11   Occupational History  . [employer]   . Factory work    Social History Main Topics  . Smoking status: Former Smoker -- 0.50 packs/day for  40 years    Types: Cigarettes    Quit date: 06/16/2009  . Smokeless tobacco: Never Used  . Alcohol Use: No  . Drug Use: No  . Sexually Active: No   Other Topics Concern  . Not on file   Social History Narrative   Married.  Lives in [place] with her husband.  Normally independent of ADLs and ambulation.    Family History  Problem Relation Age of Onset  . Heart attack Mother   . Heart attack Father   . Deep vein thrombosis Father   . Cancer Brother     Pancreas and colon    No current facility-administered medications on file prior to encounter.   Current Outpatient Prescriptions on File Prior to Encounter  Medication Sig Dispense Refill  . acetaminophen (TYLENOL) 325 MG tablet Take 2 tablets (650 mg total) by mouth every 6 (six) hours as needed (or Fever >/= 101).      [name] albuterol (PROVENTIL) (2.5 MG/3ML) 0.083% nebulizer solution Take 2.5 mg by nebulization 4 (four) times daily.      [name] ALPRAZolam (XANAX) 0.25 MG tablet Take 0.5 mg by mouth at bedtime.       . ALPRAZolam (XANAX) 1 MG tablet Take 1 tablet three times a day scheduled 8am, 2pm, and 9pm for anxiety  90 tablet  5  . Alum & Mag Hydroxide-Simeth (  MAGIC MOUTHWASH W/LIDOCAINE) SOLN Take 5 mLs by mouth 4 (four) times daily as needed (pain).    0  . antiseptic oral rinse (BIOTENE) LIQD 15 mLs by Mouth Rinse route as needed.      [name] atorvastatin (LIPITOR) 40 MG tablet Take 40 mg by mouth daily.      . budesonide (PULMICORT) 0.25 MG/2ML nebulizer solution Take 2 mLs (0.25 mg total) by nebulization daily.  60 mL    . chlorpheniramine-HYDROcodone (TUSSIONEX) 10-8 MG/5ML LQCR Take 5 mLs by mouth every 12 (twelve) hours as needed.  140 mL  0  . Docusate Sodium (DSS) 100 MG CAPS Take 100 mg by mouth 2 (two) times daily.      . fluticasone (FLONASE) 50 MCG/ACT nasal spray Place 1 spray into the nose daily.      . furosemide (LASIX) 20 MG tablet Take 1 tablet (20 mg total) by mouth daily.  30 tablet    . losartan (COZAAR) 50 MG  tablet Take 50 mg by mouth daily. Hold for sbp <  Or = 100      . morphine (MSIR) 15 MG tablet Take 1 tablet twice a day as needed for breakthrough pain  60 tablet  0  . neomycin-bacitracin-polymyxin (NEOSPORIN) OINT Apply 1 application topically daily.      [name] omeprazole (PRILOSEC) 20 MG capsule Take 20 mg by mouth daily.      . potassium chloride (MICRO-K) 10 MEQ CR capsule Take 10 mEq by mouth daily.       . simethicone (GAS-X) 80 MG chewable tablet Chew 1 tablet (80 mg total) by mouth every 4 (four) hours as needed.  30 tablet  0  . sodium chloride (OCEAN) 0.65 % SOLN nasal spray Place 1 spray into the nose as needed for congestion.      [name] Spacer/Aero-Holding Chambers (AEROCHAMBER MV) inhaler Use as instructed  1 each  0    Allergies  Allergen Reactions  . Clarithromycin     REACTION: abd pain  . Codeine   . Hydrocodone-Guaifenesin     REACTION: feels funny  . Iohexol      Desc: pt reports dyspnea and throat swelling about age 45 w/ IV contrast reaction when kidneys were being checked; 07/25/08 SL[facility]   . Moxifloxacin     REACTION: hallucinations  . Penicillins   . Prednisone Other (See Comments)    Can take but at a lower dosage  . Simvastatin   . Sulfonamide Derivatives     REACTION: unknown    REVIEW OF SYSTEMS:  (Positives checked otherwise negative)  CARDIOVASCULAR:  []  chest pain, [x]  chest pressure, []  palpitations, [x]  shortness of breath when laying flat, [x]  shortness of breath with exertion,  []  pain in feet when walking, []  pain in feet when laying flat, []  history of blood clot in veins (DVT), []  history of phlebitis, []  swelling in legs, []  varicose veins  PULMONARY:  []  productive cough, []  asthma, []  wheezing  NEUROLOGIC:  []  weakness in arms or legs, []  numbness in arms or legs, []  difficulty speaking or slurred speech, []  temporary loss of vision in one eye, []  dizziness  HEMATOLOGIC:  []  bleeding problems, []  problems with blood clotting too  easily  MUSCULOSKEL:  [x]  joint pain, []  joint swelling  GASTROINTEST:  []  vomiting blood, []  blood in stool     GENITOURINARY:  []  burning with urination, []  blood in urine  PSYCHIATRIC:  []  history of major depression  INTEGUMENTARY:  []   rashes, []  ulcers  CONSTITUTIONAL:  []  fever, []  chills  For VQI Use Only  PRE-ADM LIVING: Home  AMB STATUS: Ambulatory  CAD Sx: None  PRIOR CHF: None  STRESS TEST: [x]  No, [ ]  Normal, [ ]  + ischemia, [ ]  + MI, [ ]  Both  Physical Examination  Filed Vitals:   10/07/12 1200 10/07/12 1400 10/07/12 1432 10/07/12 1600  BP: 133/62 132/61    Pulse: 79 79    Temp: 99.5 F (37.5 C) 99.3 F (37.4 C)  99.3 F (37.4 C)  TempSrc: Core (Comment)   Core (Comment)  Resp: 16 14    Height:      Weight:      SpO2: 98% 97% 98%     Body mass index is 25.83 kg/(m^2).  General: A&O x 3, WD, elderly  Head: [place]/AT  Ear/Nose/Throat: Hearing grossly intact, nares w/o erythema or drainage, oropharynx w/o Erythema/Exudate, Mallampati score: 3,   Eyes: PERRLA, EOMI, dilated pupils  Neck: Supple, no nuchal rigidity, no palpable LAD  Pulmonary: Sym exp, good air movt, CTAB, no rhonchi, & wheezing, B faint rales  Cardiac: RRR, Nl S1, S2, no Murmurs, rubs or gallops  Vascular: Vessel Right Left  Radial Palpable Palpable  Ulnar Faintly Palpable Faintly Palpable  Brachial Palpable  Palpable  Carotid Palpable, without bruit Palpable, without bruit  Aorta Not palpable N/A  Femoral Palpable Palpable  Popliteal Not palpable Not palpable  PT Palpable Palpable  DP Palpable Palpable   Gastrointestinal: soft, NTND, -G/R, - HSM, - masses, - CVAT B  Musculoskeletal: M/S 5/5 throughout grossly, Extremities without ischemic changes , R echymotic shin  Neurologic: CN 2-12 intact , Pain and light touch intact in extremities , Motor exam as listed above  Psychiatric: Judgment intact, Mood & affect appropriate for pt's clinical situation  Dermatologic:  See M/S exam for extremity exam, no rashes otherwise noted  Lymph : No Cervical, Axillary, or Inguinal lymphadenopathy   Laboratory: CBC:    Component Value Date/Time   WBC 3.1* 10/07/2012 0346   RBC 3.34* 10/07/2012 0346   HGB 8.7* 10/07/2012 0346   HCT 28.6* 10/07/2012 0346   PLT 242 10/07/2012 0346   MCV 85.6 10/07/2012 0346   MCH 26.0 10/07/2012 0346   MCHC 30.4 10/07/2012 0346   RDW 13.1 10/07/2012 0346   LYMPHSABS 1.1 10/05/2012 1030   MONOABS 0.9 10/05/2012 1030   EOSABS 0.1 10/05/2012 1030   BASOSABS 0.0 10/05/2012 1030    BMP:    Component Value Date/Time   NA 140 10/07/2012 0346   K 4.3 10/07/2012 0346   CL 107 10/07/2012 0346   CO2 25 10/07/2012 0346   GLUCOSE 133* 10/07/2012 0346   BUN 9 10/07/2012 0346   CREATININE 0.93 10/07/2012 0346   CALCIUM 8.7 10/07/2012 0346   GFRNONAA 57* 10/07/2012 0346   GFRAA 66* 10/07/2012 0346    Coagulation: Lab Results  Component Value Date   INR 0.9 10/30/2008   No results found for this basename: PTT    Radiology: Ct Angio Pelvis W/cm &/or Wo/cm  10/07/2012  *RADIOLOGY REPORT*  Clinical Data:  Hypotension  CT ANGIOGRAPHY ABDOMEN AND PELVIS  Technique:  Multidetector CT imaging of the abdomen and pelvis was performed using the standard protocol during bolus administration of intravenous contrast.  Multiplanar reconstructed images including MIPs were obtained and reviewed to evaluate the vascular anatomy.  Contrast: OMNIPAQUE IOHEXOL 350 MG/ML SOLN,  Comparison:  08/21/2009  Findings:  The aortobi-iliac stent graft is again  noted.  In endoleak is now present.  Punctate foci of contrast enhancement on arterial phase images within the aneurysm sac can be seen anteriorly on image 49, and right posterolaterally on images 63 and 62.  Delayed phase images demonstrate progressive filling of contrast within the aneurysm sac at both locations.  The stent graft is well opposed along the wall of the aorta at the neck and this is most likely a type 2  endoleak.  The offending vessel was not clearly delineated.  There is no obvious contrast filling at the origin of the IMA.  Maximal AP and transverse aneurysm sac diameters are 4.3 and 4.3 cm respectively which is stable.  There is aneurysmal dilatation of the aorta above the renal arteries with extensive irregular mural plaque and thrombus. Maximal AP and transverse diameters at the diaphragmatic hiatus are 4.5 and 4.6 cm respectively.  This has increased from 3.7 and 3.9 cm on the prior study based on my measurements.  No evidence of retroperitoneal hemorrhage or contrast extravasation beyond the lumen of the aorta to suggest rupture.  There is long segment significant narrowing in the proximal 2 cm of the celiac axis.  Branch vessels are patent.  SMA is widely patent.  Branch vessels are also grossly patent.  There is calcified plaque at the origin of the bilateral renal arteries. Significant narrowing cannot be excluded.  IMA reconstitutes.  Branches are diminutive and patent.  Aortic and iliac limbs of the graft are patent.  Distal landing zones occur in the distal common iliac arteries bilaterally. Native common iliac arteries are nonaneurysmal.  Right internal and external iliac arteries are mildly tortuous and patent.  Similarly, left internal and external iliac arteries are not aneurysmal patent.  Bilateral small pleural effusions have developed.  Dependent atelectasis coexist at both posterior lung bases.  There is consolidation in the medial basal segment of the left lower lobe adjacent to the descending aorta which may again simply represent volume loss or possibly chronic disease.  And 8 mm triangular opacity has developed at the base of the right middle lobe on image 16.  It is nonspecific and may simply represent pneumonitis or atelectasis.  It may represent an irregular nodule.  Diffuse hepatic steatosis.  Gallbladder, spleen, adrenal glands are within normal limits.  Pancreas is atrophic.  Chronic  changes of the kidneys.  Left pelvic seroma is not significantly changed.  Foley catheter decompresses the bladder.  Small amount of free fluid is seen within the dependent portion of the pelvis.  Sigmoid diverticulosis without evidence of acute diverticulitis.  Uterus is absent.  Adnexa are unremarkable.  Chronic changes in the lumbar spine without a vertebral compression deformity.  Levoscoliosis at the thoracolumbar junction.   Review of the MIP images confirms the above findings.  IMPRESSION: The aneurysm sac is stable in size however and endoleak is not visualized.  It is probably a type 2 endoleak.  New small bilateral pleural effusions with bibasilar atelectasis verses airspace disease.  There is a nonspecific 8 mm triangular opacity at the base of the right middle lobe which will require follow-up. If the patient is at high risk for bronchogenic carcinoma, follow-up chest CT at 3-6 months is recommended.  If the patient is at low risk for bronchogenic carcinoma, follow-up chest CT at 6-12 months is recommended.  This recommendation follows the consensus statement: Guidelines for Management of Small Pulmonary Nodules Detected on CT Scans: A Statement from the Fleischner Society as published in Radiology 2005;  782:956-213.  Atherosclerotic changes of the visceral vasculature as described.   Original Report Authenticated By: Jolaine Click, M.D.    Ct Angio Abdomen W/cm &/or Wo Contrast  10/07/2012  *RADIOLOGY REPORT*  Clinical Data:  Hypotension  CT ANGIOGRAPHY ABDOMEN AND PELVIS  Technique:  Multidetector CT imaging of the abdomen and pelvis was performed using the standard protocol during bolus administration of intravenous contrast.  Multiplanar reconstructed images including MIPs were obtained and reviewed to evaluate the vascular anatomy.  Contrast: OMNIPAQUE IOHEXOL 350 MG/ML SOLN,  Comparison:  08/21/2009  Findings:  The aortobi-iliac stent graft is again noted.  In endoleak is now present.   Punctate foci of contrast enhancement on arterial phase images within the aneurysm sac can be seen anteriorly on image 49, and right posterolaterally on images 63 and 62.  Delayed phase images demonstrate progressive filling of contrast within the aneurysm sac at both locations.  The stent graft is well opposed along the wall of the aorta at the neck and this is most likely a type 2 endoleak.  The offending vessel was not clearly delineated.  There is no obvious contrast filling at the origin of the IMA.  Maximal AP and transverse aneurysm sac diameters are 4.3 and 4.3 cm respectively which is stable.  There is aneurysmal dilatation of the aorta above the renal arteries with extensive irregular mural plaque and thrombus. Maximal AP and transverse diameters at the diaphragmatic hiatus are 4.5 and 4.6 cm respectively.  This has increased from 3.7 and 3.9 cm on the prior study based on my measurements.  No evidence of retroperitoneal hemorrhage or contrast extravasation beyond the lumen of the aorta to suggest rupture.  There is long segment significant narrowing in the proximal 2 cm of the celiac axis.  Branch vessels are patent.  SMA is widely patent.  Branch vessels are also grossly patent.  There is calcified plaque at the origin of the bilateral renal arteries. Significant narrowing cannot be excluded.  IMA reconstitutes.  Branches are diminutive and patent.  Aortic and iliac limbs of the graft are patent.  Distal landing zones occur in the distal common iliac arteries bilaterally. Native common iliac arteries are nonaneurysmal.  Right internal and external iliac arteries are mildly tortuous and patent.  Similarly, left internal and external iliac arteries are not aneurysmal patent.  Bilateral small pleural effusions have developed.  Dependent atelectasis coexist at both posterior lung bases.  There is consolidation in the medial basal segment of the left lower lobe adjacent to the descending aorta which may again  simply represent volume loss or possibly chronic disease.  And 8 mm triangular opacity has developed at the base of the right middle lobe on image 16.  It is nonspecific and may simply represent pneumonitis or atelectasis.  It may represent an irregular nodule.  Diffuse hepatic steatosis.  Gallbladder, spleen, adrenal glands are within normal limits.  Pancreas is atrophic.  Chronic changes of the kidneys.  Left pelvic seroma is not significantly changed.  Foley catheter decompresses the bladder.  Small amount of free fluid is seen within the dependent portion of the pelvis.  Sigmoid diverticulosis without evidence of acute diverticulitis.  Uterus is absent.  Adnexa are unremarkable.  Chronic changes in the lumbar spine without a vertebral compression deformity.  Levoscoliosis at the thoracolumbar junction.   Review of the MIP images confirms the above findings.  IMPRESSION: The aneurysm sac is stable in size however and endoleak is not visualized.  It is  probably a type 2 endoleak.  New small bilateral pleural effusions with bibasilar atelectasis verses airspace disease.  There is a nonspecific 8 mm triangular opacity at the base of the right middle lobe which will require follow-up. If the patient is at high risk for bronchogenic carcinoma, follow-up chest CT at 3-6 months is recommended.  If the patient is at low risk for bronchogenic carcinoma, follow-up chest CT at 6-12 months is recommended.  This recommendation follows the consensus statement: Guidelines for Management of Small Pulmonary Nodules Detected on CT Scans: A Statement from the Fleischner Society as published in Radiology 2005; 237:395-400.  Atherosclerotic changes of the visceral vasculature as described.   Original Report Authenticated By: Jolaine Click, M.D.    Based on my review of the CTA Abd/pelvis, the patient's EVAR is stable with stable sac size.  I agree there appears to be a Type II endoleak in the distal sac.  The mesenteric segment of  the aorta appears to be increased in size to ~4.5 cm.  There is no evidence of rupture or infection of the aorta or endograft.  Medical Decision Making  Jatziry Wechter Gallentine is a 79 y.o. female who presents with: multiple co-morbidities, chronic anemia, hypotension, and epigastric pain.   There are no emergent surgical indications at this time, as there is no evidence of rupture or acute intraabdominal pathology.    Type II endoleaks are managed mostly with observation as 90% spontaneously occlude.  Her sx are not consistent with the suprarenal aortic aneurysm in my opinion.  I would only give a symptomatic aneurysm diagnosis after ruling out more likely causes.  The suprarenal aneurysm also does not account for the hypotension and mild grade fevers in this patient.   The mesenteric segment aneurysm may need to be addressed at some point, but I doubt this 79 year old female can survive the open procedure procedure that would be required.  As fenestrated endografting is not completed locally, this option is only possible with referral to [facility].  I discussed the CTA findings with the patient and son and reassured them that there was no evidence of rupture.  Dr. Hart Rochester will come by to see the patient in the near future.  Leonides Sake, MD [facility] [place] Office: [phone] Pager: [phone]  10/07/2012, 5:07 PM

## 2012-10-07 NOTE — Progress Notes (Signed)
Visited  with patient at request of nursing staff. They indicated to me that patient's family member had just passed away at [facility] and that patient may need a visit.  I had a good visit with the patient and explained I was from [facility] and introduced myself. I asked if she had any particular concerns. She said she had just had a scan of her abdomen to see if there is a problem there.  She related that she feels she is in good hands with the Lord directing her care. She is pleased with the care she is receiving from staff here. When I asked if she had other concerns, she did not mention anything about the recent death in her family. I prayed with her and expressed hope for her continuing recovery and peace in the days ahead. I will pass on to the other chaplains to stop by to check on her.

## 2012-10-07 NOTE — Progress Notes (Signed)
This patient is receiving IV Protonix. Based on criteria approved by the Pharmacy and Therapeutics Committee, this medication is being converted to the equivalent oral dose form. These criteria include:   . The patient is eating (either orally or per tube) and/or has been taking other orally administered medications for at least 24 hours.  . This patient has no evidence of active gastrointestinal bleeding or impaired GI absorption (gastrectomy, short bowel, patient on TNA or NPO).   If you have questions about this conversion, please contact the pharmacy department.  [name], [facility] 10/07/2012 9:53 AM

## 2012-10-08 DIAGNOSIS — G9340 Encephalopathy, unspecified: Secondary | ICD-10-CM | POA: Diagnosis not present

## 2012-10-08 DIAGNOSIS — T82330A Leakage of aortic (bifurcation) graft (replacement), initial encounter: Secondary | ICD-10-CM

## 2012-10-08 DIAGNOSIS — T82598A Other mechanical complication of other cardiac and vascular devices and implants, initial encounter: Secondary | ICD-10-CM

## 2012-10-08 LAB — BASIC METABOLIC PANEL
CO2: 27 mEq/L (ref 19–32)
Calcium: 8.8 mg/dL (ref 8.4–10.5)
Creatinine, Ser: 1.01 mg/dL (ref 0.50–1.10)
GFR calc Af Amer: 60 mL/min — ABNORMAL LOW (ref >90)
GFR calc non Af Amer: 52 mL/min — ABNORMAL LOW (ref >90)
Sodium: 137 mEq/L (ref 135–145)

## 2012-10-08 LAB — CBC
Hemoglobin: 9.7 g/dL — ABNORMAL LOW (ref 12.0–15.0)
MCH: 26.9 pg (ref 26.0–34.0)
MCV: 83.7 fL (ref 78.0–100.0)
Platelets: 236 10*3/uL (ref 150–400)
Platelets: 262 10*3/uL (ref 150–400)
RBC: 3.37 MIL/uL — ABNORMAL LOW (ref 3.87–5.11)
RBC: 3.6 MIL/uL — ABNORMAL LOW (ref 3.87–5.11)
RDW: 13.2 % (ref 11.5–15.5)
WBC: 3.3 10*3/uL — ABNORMAL LOW (ref 4.0–10.5)
WBC: 5 10*3/uL (ref 4.0–10.5)

## 2012-10-08 LAB — VANCOMYCIN, TROUGH: Vancomycin Tr: 15.5 ug/mL (ref 10.0–20.0)

## 2012-10-08 MED ORDER — METHYLPREDNISOLONE SODIUM SUCC 125 MG IJ SOLR: 60.0000 mg | Freq: Two times a day (BID) | INTRAMUSCULAR | Status: DC

## 2012-10-08 MED ORDER — HYDRALAZINE HCL 20 MG/ML IJ SOLN
10.0000 mg | Freq: Four times a day (QID) | INTRAMUSCULAR | Status: DC | PRN
  Administered 2012-10-08: 10 mg via INTRAVENOUS
  Filled 2012-10-08: qty 1

## 2012-10-08 MED ORDER — LOSARTAN POTASSIUM 50 MG PO TABS
50.0000 mg | ORAL_TABLET | Freq: Every day | ORAL | Status: DC
  Administered 2012-10-08 – 2012-10-13 (×6): 50 mg via ORAL
  Filled 2012-10-08 (×6): qty 1

## 2012-10-08 MED ORDER — METHYLPREDNISOLONE SODIUM SUCC 40 MG IJ SOLR
40.0000 mg | INTRAMUSCULAR | Status: DC
  Administered 2012-10-09 – 2012-10-10 (×2): 40 mg via INTRAVENOUS
  Filled 2012-10-08 (×4): qty 1

## 2012-10-08 MED ORDER — ALPRAZOLAM 1 MG PO TABS
1.0000 mg | ORAL_TABLET | Freq: Three times a day (TID) | ORAL | Status: DC
  Administered 2012-10-08 – 2012-10-13 (×16): 1 mg via ORAL
  Filled 2012-10-08 (×16): qty 1

## 2012-10-08 NOTE — Progress Notes (Signed)
ANTIBIOTIC CONSULT NOTE - Follow-up  Pharmacy Consult for Vancomycin, Levaquin Indication: rule out pneumonia  Allergies  Allergen Reactions  . Clarithromycin     REACTION: abd pain  . Codeine   . Hydrocodone-Guaifenesin     REACTION: feels funny  . Iohexol      Desc: pt reports dyspnea and throat swelling about age 79 w/ IV contrast reaction when kidneys were being checked; 07/25/08 SLG   . Moxifloxacin     REACTION: hallucinations  . Penicillins   . Prednisone Other (See Comments)    Can take but at a lower dosage  . Simvastatin   . Sulfonamide Derivatives     REACTION: unknown    Patient Measurements: Height: 5' (152.4 cm) Weight: 132 lb 4.4 oz (60 kg) IBW/kg (Calculated) : 45.5  Vital Signs: Temp: 98.6 F (37 C) (04/25 1000) Temp src: Core (Comment) (04/25 0802) BP: 179/133 mmHg (04/25 1000) Pulse Rate: 88 (04/25 1000)  Labs:  Recent Labs  10/06/12 0215 10/06/12 2000 10/07/12 0346 10/08/12 0340  WBC 5.2  --  3.1* 3.3*  HGB 7.7* 8.2* 8.7* 8.8*  PLT 188  --  242 236  CREATININE 1.08  --  0.93 1.01   Estimated Creatinine Clearance: 36.6 ml/min (by C-G formula based on Cr of 1.01).  Microbiology: Recent Results (from the past 720 hour(s))  CULTURE, BLOOD (ROUTINE X 2)     Status: None   Collection Time    10/05/12 10:30 AM      Result Value Range Status   Specimen Description BLOOD RIGHT ANTECUBITAL   Final   Special Requests BOTTLES DRAWN AEROBIC AND ANAEROBIC   Final   Culture  Setup Time 10/05/2012 13:59   Final   Culture     Final   Value:        BLOOD CULTURE RECEIVED NO GROWTH TO DATE CULTURE WILL BE HELD FOR 5 DAYS BEFORE ISSUING A FINAL NEGATIVE REPORT   Report Status PENDING   Incomplete  CULTURE, BLOOD (ROUTINE X 2)     Status: None   Collection Time    10/05/12 10:35 AM      Result Value Range Status   Specimen Description BLOOD RIGHT FOREARM   Final   Special Requests BOTTLES DRAWN AEROBIC ONLY   Final   Culture  Setup Time  10/05/2012 13:59   Final   Culture     Final   Value:        BLOOD CULTURE RECEIVED NO GROWTH TO DATE CULTURE WILL BE HELD FOR 5 DAYS BEFORE ISSUING A FINAL NEGATIVE REPORT   Report Status PENDING   Incomplete  URINE CULTURE     Status: None   Collection Time    10/05/12 11:02 AM      Result Value Range Status   Specimen Description URINE, CATHETERIZED   Final   Special Requests NONE   Final   Culture  Setup Time 10/05/2012 14:35   Final   Colony Count NO GROWTH   Final   Culture NO GROWTH   Final   Report Status 10/06/2012 FINAL   Final  MRSA PCR SCREENING     Status: None   Collection Time    10/05/12  3:32 PM      Result Value Range Status   MRSA by PCR NEGATIVE  NEGATIVE Final   Comment:            The GeneXpert MRSA Assay (FDA     approved for NASAL  specimens     only), is one component of a     comprehensive MRSA colonization     surveillance program. It is not     intended to diagnose MRSA     infection nor to guide or     monitor treatment for     MRSA infections.    Medical History: Past Medical History  Diagnosis Date  . History of colonic polyps   . COPD (chronic obstructive pulmonary disease)   . Diverticulitis of colon   . GERD (gastroesophageal reflux disease)   . HTN (hypertension)   . Osteoarthritis   . Hyperlipemia   . Aortic aneurysm, abdominal   . Ovarian cyst, left   . Psoriasis   . Chronic respiratory failure     Uses home oxygen at 2 L  . ADRENAL MASS 05/04/2007  . GLAUCOMA 03/30/2007  . HEMORRHOIDS, INTERNAL 03/23/2008  . OSTEOPOROSIS 03/30/2007  . SCOLIOSIS 03/30/2007  . PONV (postoperative nausea and vomiting)     Medications:  Anti-infectives   Start     Dose/Rate Route Frequency Ordered Stop   10/07/12 1000  levofloxacin (LEVAQUIN) IVPB 750 mg     750 mg 100 mL/hr over 90 Minutes Intravenous Every 48 hours 10/05/12 1557     10/06/12 1000  vancomycin (VANCOCIN) 500 mg in sodium chloride 0.9 % 100 mL IVPB     500 mg 100 mL/hr over  60 Minutes Intravenous Every 12 hours 10/06/12 0916     10/05/12 1600  vancomycin (VANCOCIN) IVPB 750 mg/150 ml premix  Status:  Discontinued     750 mg 150 mL/hr over 60 Minutes Intravenous Every 24 hours 10/05/12 1557 10/06/12 0916   10/05/12 1330  levofloxacin (LEVAQUIN) IVPB 750 mg     750 mg 100 mL/hr over 90 Minutes Intravenous  Once 10/05/12 1321 10/05/12 1502     Assessment: 79 yo F with code sepsis in the ED.  Presented with cough and epigastric pain, admit 4/22 with r/o pneumonia.  PO Doxycycline started PTA, but symptoms worsened. PMH includes COPD on home O2, and HTN.  Pharmacy is asked to assist with antibiotic dosing.  4/18 >> Doxy PTA 4/22 >> Levaquin >> 4/22 >> Vanc >>  Temp: HYPOthermic on admit, now normothermic WBC: 3.3 Renal: SCr =1.01, CrCl 5ml/min (N), 30ml/min (CG)  Cultures: 4/22 blood x2: NGTD 4/22 urine: NG  Levels/Dose Changes: 4/23: increased to 500mg  q12h from 750mg  q24h for improved SCr 4/25: VT = 15.5 mcg/ml on 500mg  IV q12h (prior to 5th dose)  Goal of Therapy:  Vancomycin trough level 15-20 mcg/ml  Plan:  Day #4 vanco/levofloxacin  Vancomycin trough within goal, continue vancomycin 500mg  IV q12h  Levofloxacin 750mg  IV q48h  remains appropriate for renal function  Follow-up de-escalation of antibiotics  Juliette Alcide, PharmD, BCPS.   Pager: [phone] 10/08/2012 11:41 AM

## 2012-10-08 NOTE — Progress Notes (Signed)
Patient ID: [name], female   DOB: [birth year], 79 y.o.   MRN: [id number] Informed of patient by Dr. Standley Brooking who saw patient last night I have reviewed his CT angiogram performed during this admission Patient had aortic stent graft placed by me in 2010-gore -Excluder I saw patient in September 20 13th with duplex scan in the office she is due to return in September 2014 with CT angiogram She has none suprarenal component of the aneurysm which has now slightly enlarged to 4.5 cm in maximum diameter-no evidence of rupture or leak As noted by Dr. Carleene Mains is unlikely this will require treatment and is not responsible for patient's current symptoms during this hospitalization  As previously scheduled I will see patient in 6 months with repeat CT angiogram in my office unless something changes in the interim   We'll see again at your request

## 2012-10-08 NOTE — Clinical Social Work Note (Signed)
CSW received return call from son. He would like Pt to return to SNF as he feels ALF is not enough care for Pt. CSW will follow for SNF placement.   Doreen Salvage, LCSW ICU/Stepdown Clinical Social Worker [facility] Cell [phone] Hours 8am-1200pm M-F

## 2012-10-08 NOTE — Progress Notes (Signed)
Physical Therapy Treatment Patient Details Name: [name] MRN: [id number] DOB: 03-Dec-1930 Today's Date: 10/08/2012 Time: 4098-1191 PT Time Calculation (min): 23 min  PT Assessment / Plan / Recommendation Comments on Treatment Session  Pt very shaky initially. Increaed anxiety at times, especially when SOB/fatigued. Continue to recommend SNF.     Follow Up Recommendations  SNF     Does the patient have the potential to tolerate intense rehabilitation     Barriers to Discharge        Equipment Recommendations  Rolling walker with 5" wheels    Recommendations for Other Services OT consult  Frequency Min 3X/week   Plan Discharge plan remains appropriate    Precautions / Restrictions Precautions Precautions: Fall Restrictions Weight Bearing Restrictions: No   Pertinent Vitals/Pain Catheter "burning"-RN made aware    Mobility  Bed Mobility Bed Mobility: Not assessed Transfers Transfers: Sit to Stand;Stand to Sit Sit to Stand: 4: Min assist;From chair/3-in-1 Stand to Sit: 4: Min assist;To chair/3-in-1 Details for Transfer Assistance: Vc's for hand placement and to stay inside of walker Ambulation/Gait Ambulation/Gait Assistance: 4: Min assist Ambulation Distance (Feet): 60 Feet Assistive device: Rolling walker Ambulation/Gait Assistance Details: Assist to stabilize and maneuver safely with RW, especially with turns. VCS safety, distance from RW, pacing.  Gait Pattern: Step-through pattern;Decreased stride length    Exercises     PT Diagnosis:    PT Problem List:   PT Treatment Interventions:     PT Goals Acute Rehab PT Goals Pt will go Sit to Stand: with supervision PT Goal: Sit to Stand - Progress: Progressing toward goal Pt will Ambulate: 51 - 150 feet;with supervision;with least restrictive assistive device PT Goal: Ambulate - Progress: Progressing toward goal  Visit Information  Last PT Received On: 10/08/12 Assistance Needed: +1    Subjective Data  Subjective: I was upset earlier Patient Stated Goal: Get better. Back to ALF   Cognition  Cognition Arousal/Alertness: Awake/alert Behavior During Therapy: Anxious Overall Cognitive Status: No family/caregiver present to determine baseline cognitive functioning    Balance  Balance Balance Assessed: Yes Static Standing Balance Static Standing - Balance Support: No upper extremity supported Static Standing - Level of Assistance: 4: Min assist  End of Session PT - End of Session Equipment Utilized During Treatment: Gait belt;Oxygen Activity Tolerance: Patient limited by fatigue Patient left: in chair;with call bell/phone within reach   GP     Rebeca Alert, MPT Pager: [phone]

## 2012-10-08 NOTE — Evaluation (Signed)
Occupational Therapy Evaluation Patient Details Name: [name] MRN: [id number] DOB: 22-Sep-1930 Today's Date: 10/08/2012 Time: 4401-0272 OT Time Calculation (min): 23 min  OT Assessment / Plan / Recommendation Clinical Impression   [name] admitted with hypotension, unclear etiology.  Pt. With h/o COPD and is 02 dependent.  Currently, pt presents to OT with decreased activity tolerance - HR to 136 with minimal ambulation.  Sats >94% on 3L.  She also presents with confusion.  She will benefit from OT to maximize safety and independence with BADLs.  Feel she will benefit from SNF level rehab at D/C    OT Assessment  Patient needs continued OT Services    Follow Up Recommendations  SNF;Supervision/Assistance - 24 hour    Barriers to Discharge Decreased caregiver support    Equipment Recommendations  None recommended by OT    Recommendations for Other Services    Frequency  Min 2X/week    Precautions / Restrictions Precautions Precautions: Fall Restrictions Weight Bearing Restrictions: No       ADL  Eating/Feeding: Set up Where Assessed - Eating/Feeding: Chair Grooming: Wash/dry hands;Wash/dry face;Brushing hair;Supervision/safety Where Assessed - Grooming: Supported sitting Upper Body Bathing: Minimal assistance Where Assessed - Upper Body Bathing: Supported sitting Lower Body Bathing: Moderate assistance Where Assessed - Lower Body Bathing: Supported sit to stand Upper Body Dressing: Minimal assistance Where Assessed - Upper Body Dressing: Unsupported sitting Lower Body Dressing: Minimal assistance Where Assessed - Lower Body Dressing: Supported sit to stand Toilet Transfer: Minimal assistance Toilet Transfer Method: Sit to Barista: Bedside commode Toileting - Clothing Manipulation and Hygiene: Minimal assistance Where Assessed - Engineer, mining and Hygiene: Standing Equipment Used: Rolling  walker Transfers/Ambulation Related to ADLs: Pt ambulates with min A.   Pt with body tremors/jerkiness.  she reports this is due to be angry earlier ADL Comments: Pt able to don/doff socks with supervision, but HR to 135 and dyspnea 3/4.  Pt. limited with ADLs due to fatigue and dyspnea    OT Diagnosis: Generalized weakness;Cognitive deficits  OT Problem List: Decreased strength;Decreased activity tolerance;Impaired balance (sitting and/or standing);Decreased cognition OT Treatment Interventions: Self-care/ADL training;DME and/or AE instruction;Therapeutic activities;Cognitive remediation/compensation;Patient/family education;Balance training   OT Goals Acute Rehab OT Goals OT Goal Formulation: With patient Time For Goal Achievement: 10/22/12 Potential to Achieve Goals: Good ADL Goals Pt Will Perform Grooming: with supervision;Standing at sink ADL Goal: Grooming - Progress: Goal set today Pt Will Perform Upper Body Bathing: with supervision;Sitting, chair ADL Goal: Upper Body Bathing - Progress: Goal set today Pt Will Perform Lower Body Bathing: with supervision;Sit to stand from chair;Sit to stand from bed ADL Goal: Lower Body Bathing - Progress: Goal set today Pt Will Perform Upper Body Dressing: with supervision;Sitting, chair;Sitting, bed ADL Goal: Upper Body Dressing - Progress: Goal set today Pt Will Perform Lower Body Dressing: with supervision;Sit to stand from chair;Sit to stand from bed ADL Goal: Lower Body Dressing - Progress: Goal set today Pt Will Transfer to Toilet: with supervision;Ambulation;Comfort height toilet ADL Goal: Toilet Transfer - Progress: Goal set today Pt Will Perform Toileting - Clothing Manipulation: with supervision;Standing ADL Goal: Toileting - Clothing Manipulation - Progress: Goal set today Pt Will Perform Toileting - Hygiene: with supervision;Standing at 3-in-1/toilet ADL Goal: Toileting - Hygiene - Progress: Goal set today Additional ADL Goal #1: Pt  will participate in 25 mins therapeutic acitivity to increase endurance needed for BADLs ADL Goal: Additional Goal #1 - Progress: Goal set today  Visit Information  Last OT Received On: 10/08/12 Assistance Needed: +1    Subjective Data  Subjective: "My husband just died 3 weeks ago" Patient Stated Goal: To go home   Prior Functioning     Home Living Type of Home: Assisted living Home Access: Level entry Home Layout: One level Bathroom Shower/Tub: Health visitor: Handicapped height Home Adaptive Equipment: None Prior Function Level of Independence: Needs assistance Needs Assistance: Bathing;Dressing;Meal Prep Bath: Minimal Dressing: Minimal Meal Prep: Total Comments: Pt was ambulatory without assistive device Communication Communication: No difficulties         Vision/Perception     Cognition  Cognition Arousal/Alertness: Awake/alert Behavior During Therapy: Anxious Overall Cognitive Status: No family/caregiver present to determine baseline cognitive functioning (Pt confused)    Extremity/Trunk Assessment Right Upper Extremity Assessment RUE ROM/Strength/Tone: Within functional levels RUE Coordination: Deficits (tremor) Left Upper Extremity Assessment LUE ROM/Strength/Tone: Within functional levels (h/o shoulder replacement) LUE Coordination: Deficits (tremor) Trunk Assessment Trunk Assessment: Normal     Mobility Bed Mobility Bed Mobility: Not assessed Transfers Transfers: Sit to Stand;Stand to Sit Sit to Stand: 4: Min assist;From chair/3-in-1;With upper extremity assist Stand to Sit: 4: Min assist;With upper extremity assist;To chair/3-in-1 Details for Transfer Assistance: Vc's for hand placement and to stay inside of walker     Exercise     Balance Balance Balance Assessed: Yes Static Standing Balance Static Standing - Balance Support: No upper extremity supported Static Standing - Level of Assistance: 4: Min assist   End of  Session OT - End of Session Equipment Utilized During Treatment: Gait belt Patient left: in chair;with call bell/phone within reach Nurse Communication: Mobility status (catheter burning)  GO     [name] M 10/08/2012, 12:44 PM

## 2012-10-08 NOTE — Progress Notes (Signed)
[facility] PROGRESS NOTE  [name] WUX:[id number] DOB: 1930/10/25 DOA: 10/05/2012 PCP: [name], MD  Assessment/Plan: . Hypotension -Unclear etiology, possibly secondary to infection/pneumonia given the patient was hypothermic as well, but Lactate is normal  -Improved with IV fluids and empiric antibiotics- Vanco and Levaquin.  - CT angiogram of the abdomen with a type II endoleak. Per vascular surgery continue observation as 90% the spontaneously occlude. Follow. Patient's blood pressure is elevated this morning. We'll resume patient's losartan.  -random cortisol level normal. U. A- and urine cultures so far no growth . Probable pneumonia  withSepsis Syndrome  -As discussed above, sputum with clear mucus. Blood cultures pending . Continue empiric antibiotics of vancomycin and levaquin. Continue tessalon perles. Acute encephalopathy Likely secondary to steroid psychosis versus sundowning. Will resume patient back on a home regimen of Xanax 1 mg by mouth 3 times a day. We'll decrease IV Solu-Medrol to 40 mg IV daily. Patient with no neurological deficits. Follow.  . ARF (acute renal failure)  -likely pre-renal. Improving with IVF. Losartan has been resumed monitor renal function. .Anemia  -Hb 9.6>> 7.7 >> 8.7 >> 8.8 this a.m., with hydration -Follow and transfuse as above, CTA with a type II endovascular leak. Repeat CBC this afternoon. Follow.  Hypokalemia -Repleted.  . Acute mild COPD (chronic obstructive pulmonary disease), Gold D  -has chronic SOB-with some mild worsening  -Continue on scheduled nebulized bronchodilators. -Decrease IV Solu-Medrol to 40 mg IV daily as patient with some confusion this morning.  . Anxiety  - Change Xanax to 1 mg by mouth 3 times a day which was her home dose.  [name] HYPERTENSION  - Blood pressure elevated. Resume losartan. Continue to hold Lasix.  . ABDOMINAL AORTIC ANEURYSM STATUS post repair/type II endoleak -Has a h/o of endo-vascular  AAA repair -CT angiogram of abdomen with a type II endoleak. Patient's blood pressure is now stable although she was hypotensive on admission. Hemoglobin is 8.8 was 8.7 yesterday. Patient has been seen  by vascular surgery and recommend observation as 90% of the spontaneously occlude. Per vascular surgery no evidence of rupture or acute intra-abdominal pathology. Vascular surgery following and appreciate input and recommendations. Will repeat CBC this afternoon.   [name] GERD (gastroesophageal reflux disease)  -likely the cause for epigastric pain  -continue on PPI  -prn Antacids  Constipation Per abdominal xray. Patient with good BM yesterday. Continue miralax BID and senokot and follow. Abdominal Pain ??etiology. Clinical improvement. Maybe secondary to GERD vs constipation vs aneurysmal. CT angio abdomen with a type II endoleak.  Follow. Supportive treatment.     Code Status:DNR Family Communication: No family present Disposition Plan: pending clinical course   Consultants:  Vascular surgery: Dr. Imogene Burn  4/ 24/ 2014  Procedures:  CTA  abdomen 10/07/12  Antibiotics:  Vanc and levaquin 10/05/12  HPI/Subjective: Patient states epigastric pain improving, states her breathing better and unchanged from her chronic state. Per nursing patient confused this morning.   Objective: Filed Vitals:   10/08/12 0200 10/08/12 0400 10/08/12 0600 10/08/12 0802  BP: 170/78 176/82 133/108   Pulse: 81 80    Temp: 98.4 F (36.9 C) 98.1 F (36.7 C) 98.6 F (37 C) 98.6 F (37 C)  TempSrc: Core (Comment) Core (Comment)  Core (Comment)  Resp: 16 16 20    Height:      Weight:      SpO2: 97% 98%      Intake/Output Summary (Last 24 hours) at 10/08/12 2536 Last data filed at  10/08/12 0700  Gross per 24 hour  Intake    570 ml  Output    950 ml  Net   -380 ml   Filed Weights   10/05/12 1546 10/07/12 0349  Weight: 56.8 kg (125 lb 3.5 oz) 60 kg (132 lb 4.4 oz)    Exam:   General:  Elderly  female, alert and appropriate in no apparent distress. Nontoxic appearing  Cardiovascular: Regular rate and rhythm, normal S1-S2  Respiratory: Scattered expiratory wheezes.  Abdomen: Soft, bowel sounds present, NTTP, nondistended, no guarding and no masses palpable.  Extremities: No cyanosis and no edema  Data Reviewed: Basic Metabolic Panel:  Recent Labs Lab 10/05/12 1030 10/05/12 1411 10/06/12 0215 10/07/12 0346 10/08/12 0340  NA 142 144 141 140 137  K 3.5 3.7 3.2* 4.3 4.1  CL 102 106 106 107 104  CO2 29  --  28 25 27   GLUCOSE 104* 102* 80 133* 139*  BUN 16 14 12 9 12   CREATININE 1.36* 1.20* 1.08 0.93 1.01  CALCIUM 9.2  --  8.0* 8.7 8.8   Liver Function Tests:  Recent Labs Lab 10/05/12 1030 10/06/12 0215  AST 17 15  ALT 7 6  ALKPHOS 83 64  BILITOT 0.3 0.1*  PROT 6.2 4.8*  ALBUMIN 2.6* 2.1*   No results found for this basename: LIPASE, AMYLASE,  in the last 168 hours No results found for this basename: AMMONIA,  in the last 168 hours CBC:  Recent Labs Lab 10/05/12 1030 10/05/12 1411 10/06/12 0215 10/06/12 2000 10/07/12 0346 10/08/12 0340  WBC 6.1  --  5.2  --  3.1* 3.3*  NEUTROABS 3.9  --   --   --   --   --   HGB 9.6* 8.2* 7.7* 8.2* 8.7* 8.8*  HCT 31.6* 24.0* 25.4* 26.5* 28.6* 28.2*  MCV 87.5  --  87.0  --  85.6 83.7  PLT 300  --  188  --  242 236   Cardiac Enzymes:  Recent Labs Lab 10/05/12 1955 10/06/12 0215  TROPONINI <0.30 <0.30   BNP (last 3 results)  Recent Labs  05/21/12 0506 07/03/12 0630  PROBNP 383.1 674.3*   CBG: No results found for this basename: GLUCAP,  in the last 168 hours  Recent Results (from the past 240 hour(s))  CULTURE, BLOOD (ROUTINE X 2)     Status: None   Collection Time    10/05/12 10:30 AM      Result Value Range Status   Specimen Description BLOOD RIGHT ANTECUBITAL   Final   Special Requests BOTTLES DRAWN AEROBIC AND ANAEROBIC   Final   Culture  Setup Time 10/05/2012 13:59   Final   Culture      Final   Value:        BLOOD CULTURE RECEIVED NO GROWTH TO DATE CULTURE WILL BE HELD FOR 5 DAYS BEFORE ISSUING A FINAL NEGATIVE REPORT   Report Status PENDING   Incomplete  CULTURE, BLOOD (ROUTINE X 2)     Status: None   Collection Time    10/05/12 10:35 AM      Result Value Range Status   Specimen Description BLOOD RIGHT FOREARM   Final   Special Requests BOTTLES DRAWN AEROBIC ONLY   Final   Culture  Setup Time 10/05/2012 13:59   Final   Culture     Final   Value:        BLOOD CULTURE RECEIVED NO GROWTH TO DATE CULTURE WILL BE  HELD FOR 5 DAYS BEFORE ISSUING A FINAL NEGATIVE REPORT   Report Status PENDING   Incomplete  URINE CULTURE     Status: None   Collection Time    10/05/12 11:02 AM      Result Value Range Status   Specimen Description URINE, CATHETERIZED   Final   Special Requests NONE   Final   Culture  Setup Time 10/05/2012 14:35   Final   Colony Count NO GROWTH   Final   Culture NO GROWTH   Final   Report Status 10/06/2012 FINAL   Final  MRSA PCR SCREENING     Status: None   Collection Time    10/05/12  3:32 PM      Result Value Range Status   MRSA by PCR NEGATIVE  NEGATIVE Final   Comment:            The GeneXpert MRSA Assay (FDA     approved for NASAL specimens     only), is one component of a     comprehensive MRSA colonization     surveillance program. It is not     intended to diagnose MRSA     infection nor to guide or     monitor treatment for     MRSA infections.     Studies: Ct Angio Pelvis W/cm &/or Wo/cm  10/07/2012  *RADIOLOGY REPORT*  Clinical Data:  Hypotension  CT ANGIOGRAPHY ABDOMEN AND PELVIS  Technique:  Multidetector CT imaging of the abdomen and pelvis was performed using the standard protocol during bolus administration of intravenous contrast.  Multiplanar reconstructed images including MIPs were obtained and reviewed to evaluate the vascular anatomy.  Contrast: OMNIPAQUE IOHEXOL 350 MG/ML SOLN,  Comparison:  08/21/2009  Findings:  The  aortobi-iliac stent graft is again noted.  In endoleak is now present.  Punctate foci of contrast enhancement on arterial phase images within the aneurysm sac can be seen anteriorly on image 49, and right posterolaterally on images 63 and 62.  Delayed phase images demonstrate progressive filling of contrast within the aneurysm sac at both locations.  The stent graft is well opposed along the wall of the aorta at the neck and this is most likely a type 2 endoleak.  The offending vessel was not clearly delineated.  There is no obvious contrast filling at the origin of the IMA.  Maximal AP and transverse aneurysm sac diameters are 4.3 and 4.3 cm respectively which is stable.  There is aneurysmal dilatation of the aorta above the renal arteries with extensive irregular mural plaque and thrombus. Maximal AP and transverse diameters at the diaphragmatic hiatus are 4.5 and 4.6 cm respectively.  This has increased from 3.7 and 3.9 cm on the prior study based on my measurements.  No evidence of retroperitoneal hemorrhage or contrast extravasation beyond the lumen of the aorta to suggest rupture.  There is long segment significant narrowing in the proximal 2 cm of the celiac axis.  Branch vessels are patent.  SMA is widely patent.  Branch vessels are also grossly patent.  There is calcified plaque at the origin of the bilateral renal arteries. Significant narrowing cannot be excluded.  IMA reconstitutes.  Branches are diminutive and patent.  Aortic and iliac limbs of the graft are patent.  Distal landing zones occur in the distal common iliac arteries bilaterally. Native common iliac arteries are nonaneurysmal.  Right internal and external iliac arteries are mildly tortuous and patent.  Similarly, left internal and external iliac  arteries are not aneurysmal patent.  Bilateral small pleural effusions have developed.  Dependent atelectasis coexist at both posterior lung bases.  There is consolidation in the medial basal segment  of the left lower lobe adjacent to the descending aorta which may again simply represent volume loss or possibly chronic disease.  And 8 mm triangular opacity has developed at the base of the right middle lobe on image 16.  It is nonspecific and may simply represent pneumonitis or atelectasis.  It may represent an irregular nodule.  Diffuse hepatic steatosis.  Gallbladder, spleen, adrenal glands are within normal limits.  Pancreas is atrophic.  Chronic changes of the kidneys.  Left pelvic seroma is not significantly changed.  Foley catheter decompresses the bladder.  Small amount of free fluid is seen within the dependent portion of the pelvis.  Sigmoid diverticulosis without evidence of acute diverticulitis.  Uterus is absent.  Adnexa are unremarkable.  Chronic changes in the lumbar spine without a vertebral compression deformity.  Levoscoliosis at the thoracolumbar junction.   Review of the MIP images confirms the above findings.  IMPRESSION: The aneurysm sac is stable in size however and endoleak is not visualized.  It is probably a type 2 endoleak.  New small bilateral pleural effusions with bibasilar atelectasis verses airspace disease.  There is a nonspecific 8 mm triangular opacity at the base of the right middle lobe which will require follow-up. If the patient is at high risk for bronchogenic carcinoma, follow-up chest CT at 3-6 months is recommended.  If the patient is at low risk for bronchogenic carcinoma, follow-up chest CT at 6-12 months is recommended.  This recommendation follows the consensus statement: Guidelines for Management of Small Pulmonary Nodules Detected on CT Scans: A Statement from the Fleischner Society as published in Radiology 2005; 237:395-400.  Atherosclerotic changes of the visceral vasculature as described.   Original Report Authenticated By: Jolaine Click, M.D.    Ct Angio Abdomen W/cm &/or Wo Contrast  10/07/2012  *RADIOLOGY REPORT*  Clinical Data:  Hypotension  CT ANGIOGRAPHY  ABDOMEN AND PELVIS  Technique:  Multidetector CT imaging of the abdomen and pelvis was performed using the standard protocol during bolus administration of intravenous contrast.  Multiplanar reconstructed images including MIPs were obtained and reviewed to evaluate the vascular anatomy.  Contrast: OMNIPAQUE IOHEXOL 350 MG/ML SOLN,  Comparison:  08/21/2009  Findings:  The aortobi-iliac stent graft is again noted.  In endoleak is now present.  Punctate foci of contrast enhancement on arterial phase images within the aneurysm sac can be seen anteriorly on image 49, and right posterolaterally on images 63 and 62.  Delayed phase images demonstrate progressive filling of contrast within the aneurysm sac at both locations.  The stent graft is well opposed along the wall of the aorta at the neck and this is most likely a type 2 endoleak.  The offending vessel was not clearly delineated.  There is no obvious contrast filling at the origin of the IMA.  Maximal AP and transverse aneurysm sac diameters are 4.3 and 4.3 cm respectively which is stable.  There is aneurysmal dilatation of the aorta above the renal arteries with extensive irregular mural plaque and thrombus. Maximal AP and transverse diameters at the diaphragmatic hiatus are 4.5 and 4.6 cm respectively.  This has increased from 3.7 and 3.9 cm on the prior study based on my measurements.  No evidence of retroperitoneal hemorrhage or contrast extravasation beyond the lumen of the aorta to suggest rupture.  There is  long segment significant narrowing in the proximal 2 cm of the celiac axis.  Branch vessels are patent.  SMA is widely patent.  Branch vessels are also grossly patent.  There is calcified plaque at the origin of the bilateral renal arteries. Significant narrowing cannot be excluded.  IMA reconstitutes.  Branches are diminutive and patent.  Aortic and iliac limbs of the graft are patent.  Distal landing zones occur in the distal common iliac arteries  bilaterally. Native common iliac arteries are nonaneurysmal.  Right internal and external iliac arteries are mildly tortuous and patent.  Similarly, left internal and external iliac arteries are not aneurysmal patent.  Bilateral small pleural effusions have developed.  Dependent atelectasis coexist at both posterior lung bases.  There is consolidation in the medial basal segment of the left lower lobe adjacent to the descending aorta which may again simply represent volume loss or possibly chronic disease.  And 8 mm triangular opacity has developed at the base of the right middle lobe on image 16.  It is nonspecific and may simply represent pneumonitis or atelectasis.  It may represent an irregular nodule.  Diffuse hepatic steatosis.  Gallbladder, spleen, adrenal glands are within normal limits.  Pancreas is atrophic.  Chronic changes of the kidneys.  Left pelvic seroma is not significantly changed.  Foley catheter decompresses the bladder.  Small amount of free fluid is seen within the dependent portion of the pelvis.  Sigmoid diverticulosis without evidence of acute diverticulitis.  Uterus is absent.  Adnexa are unremarkable.  Chronic changes in the lumbar spine without a vertebral compression deformity.  Levoscoliosis at the thoracolumbar junction.   Review of the MIP images confirms the above findings.  IMPRESSION: The aneurysm sac is stable in size however and endoleak is not visualized.  It is probably a type 2 endoleak.  New small bilateral pleural effusions with bibasilar atelectasis verses airspace disease.  There is a nonspecific 8 mm triangular opacity at the base of the right middle lobe which will require follow-up. If the patient is at high risk for bronchogenic carcinoma, follow-up chest CT at 3-6 months is recommended.  If the patient is at low risk for bronchogenic carcinoma, follow-up chest CT at 6-12 months is recommended.  This recommendation follows the consensus statement: Guidelines for  Management of Small Pulmonary Nodules Detected on CT Scans: A Statement from the Fleischner Society as published in Radiology 2005; 237:395-400.  Atherosclerotic changes of the visceral vasculature as described.   Original Report Authenticated By: Jolaine Click, M.D.     Scheduled Meds: . albuterol  2.5 mg Nebulization Q6H  . ALPRAZolam  1 mg Oral TID  . antiseptic oral rinse  15 mL Mouth Rinse q12n4p  . benzonatate  100 mg Oral TID  . budesonide  0.25 mg Nebulization BID  . chlorhexidine  15 mL Mouth Rinse BID  . enoxaparin (LOVENOX) injection  40 mg Subcutaneous Q24H  . fluticasone  1 spray Each Nare Daily  . ipratropium  0.5 mg Nebulization Q6H  . levofloxacin (LEVAQUIN) IV  750 mg Intravenous Q48H  . losartan  50 mg Oral Daily  . methylPREDNISolone (SOLU-MEDROL) injection  40 mg Intravenous Q24H  . pantoprazole  40 mg Oral Daily  . polyethylene glycol  17 g Oral BID  . senna-docusate  1 tablet Oral QHS  . sodium chloride  3 mL Intravenous Q12H  . vancomycin  500 mg Intravenous Q12H   Continuous Infusions: . sodium chloride 10 mL/hr (10/07/12 0948)  Principal Problem:   Hypotension Active Problems:   HYPERTENSION   ABDOMINAL AORTIC ANEURYSM   COPD (chronic obstructive pulmonary disease), Gold D    GERD (gastroesophageal reflux disease)   HTN (hypertension)   Chronic respiratory failure   Anxiety   ARF (acute renal failure)   CAP (community acquired pneumonia)   Unspecified constipation   Endoleak of aortic graft   Acute encephalopathy    Time spent: >    [facility]  [facility] Pager [phone]. If 7PM-7AM, please contact night-coverage at [email or web address], password [facility] 10/08/2012, 9:31 AM  LOS: 3 days

## 2012-10-08 NOTE — Progress Notes (Signed)
Stopped to see patient today. She was up in a chair and expecting family. Had a short visit and expressed hope for her continuing recovery.

## 2012-10-08 NOTE — Clinical Social Work Note (Signed)
CSW following for post acute placement at either ALF or SNF. CSW reviewed PT note that rec both options. CSW sent out clinicals to [facility] and [facility]. CSW also left message for son to try to discuss further and work on plan. CSW got FL2 signed for SNF.   Doreen Salvage, LCSW ICU/Stepdown Clinical Social Worker [facility] Cell [phone] Hours 8am-1200pm M-F

## 2012-10-09 LAB — CBC
HCT: 28.3 % — ABNORMAL LOW (ref 36.0–46.0)
MCV: 83.2 fL (ref 78.0–100.0)
RBC: 3.4 MIL/uL — ABNORMAL LOW (ref 3.87–5.11)
RDW: 13.6 % (ref 11.5–15.5)
WBC: 4.9 10*3/uL (ref 4.0–10.5)

## 2012-10-09 LAB — GLUCOSE, CAPILLARY

## 2012-10-09 LAB — BASIC METABOLIC PANEL
BUN: 16 mg/dL (ref 6–23)
CO2: 28 mEq/L (ref 19–32)
Chloride: 107 mEq/L (ref 96–112)
GFR calc Af Amer: 58 mL/min — ABNORMAL LOW (ref >90)
Potassium: 4.2 mEq/L (ref 3.5–5.1)

## 2012-10-09 NOTE — Progress Notes (Signed)
[facility] PROGRESS NOTE  [name] YQM:[id number] DOB: Dec 12, 1930 DOA: 10/05/2012 PCP: [name], MD  Assessment/Plan: . Hypotension -Unclear etiology, possibly secondary to infection/pneumonia given the patient was hypothermic as well, but Lactate is normal  -Improved with IV fluids and empiric antibiotics- Vanco and Levaquin.  - CT angiogram of the abdomen with a type II endoleak. Per vascular surgery continue observation as 90% the spontaneously occlude. Follow. Patient's blood pressure is stable would resumption of patient's losartan.  -random cortisol level normal. U. A- and urine cultures so far no growth . Probable pneumonia  withSepsis Syndrome  -As discussed above, sputum with clear mucus. Blood cultures pending with no growth to date. Will DC IV vancomycin. Continue IV Levaquin. Continue tessalon perles. Acute encephalopathy Likely secondary to steroid psychosis versus sundowning. Clinical improvement after decrease of Solu-Medrol and resumption of patient's home dose Xanax.  Patient with no neurological deficits. Follow.  . ARF (acute renal failure)  -likely pre-renal. Improving with IVF. Losartan has been resumed monitor renal function. .Anemia  -Hb 9.6>> 7.7 >> 8.7 >> 8.8 >> 9.0 this a.m., with hydration -Follow and transfuse as above, CTA with a type II endovascular leak. Follow.  Hypokalemia -Repleted.  . Acute mild COPD (chronic obstructive pulmonary disease), Gold D  -has chronic SOB-with some mild worsening  -Continue on scheduled nebulized bronchodilators. -Continue IV Solu-Medrol 40 mg IV daily.  . Anxiety  - Continue Xanax to 1 mg by mouth 3 times a day which was her home dose.  [name] HYPERTENSION  - Continue losartan. Continue to hold Lasix.  . ABDOMINAL AORTIC ANEURYSM STATUS post repair/type II endoleak -Has a h/o of endo-vascular AAA repair -CT angiogram of abdomen with a type II endoleak. Patient's blood pressure is now stable although she was  hypotensive on admission. Hemoglobin is 9 from 8.8 from 8.7. Patient has been seen  by vascular surgery and recommend observation as 90% of the spontaneously occlude. Per vascular surgery no evidence of rupture or acute intra-abdominal pathology. Vascular surgery following and appreciate input and recommendations.   [name] GERD (gastroesophageal reflux disease)  -likely the cause for epigastric pain  -continue on PPI  -prn Antacids  Constipation Per abdominal xray. Patient with good BM yesterday. Continue miralax BID and senokot and follow. Abdominal Pain ??etiology. Clinical improvement. Maybe secondary to GERD vs constipation vs aneurysmal. CT angio abdomen with a type II endoleak.  Follow. Supportive treatment.     Code Status:DNR Family Communication: No family present Disposition Plan: Transfer to telemetry. Likely needs SNF.   Consultants:  Vascular surgery: Dr. Imogene Burn  4/ 24/ 2014  Procedures:  CTA  abdomen 10/07/12  Antibiotics:  IV Vanc 10/05/12 >>>10/09/12  IV Levaquin 10/05/2012  HPI/Subjective: Patient states epigastric pain improving, states her breathing better and unchanged from her chronic state. Patient alert and oriented. Not confused this morning.    Objective: Filed Vitals:   10/09/12 0200 10/09/12 0400 10/09/12 0600 10/09/12 0826  BP: 136/59 131/65    Pulse: 79 77 74   Temp: 98.1 F (36.7 C) 97.7 F (36.5 C) 98.6 F (37 C)   TempSrc:      Resp:   18   Height:      Weight:      SpO2: 100% 100% 100% 95%    Intake/Output Summary (Last 24 hours) at 10/09/12 0829 Last data filed at 10/09/12 0430  Gross per 24 hour  Intake    870 ml  Output    800 ml  Net  70 ml   Filed Weights   10/05/12 1546 10/07/12 0349  Weight: 56.8 kg (125 lb 3.5 oz) 60 kg (132 lb 4.4 oz)    Exam:   General:  Elderly female, alert and appropriate in no apparent distress. Nontoxic appearing  Cardiovascular: Regular rate and rhythm, normal S1-S2  Respiratory: Min  scattered expiratory wheezes.  Abdomen: Soft, bowel sounds present, NTTP, nondistended, no guarding and no masses palpable.  Extremities: No cyanosis and no edema  Data Reviewed: Basic Metabolic Panel:  Recent Labs Lab 10/05/12 1030 10/05/12 1411 10/06/12 0215 10/07/12 0346 10/08/12 0340 10/09/12 0404  NA 142 144 141 140 137 141  K 3.5 3.7 3.2* 4.3 4.1 4.2  CL 102 106 106 107 104 107  CO2 29  --  28 25 27 28   GLUCOSE 104* 102* 80 133* 139* 106*  BUN 16 14 12 9 12 16   CREATININE 1.36* 1.20* 1.08 0.93 1.01 1.04  CALCIUM 9.2  --  8.0* 8.7 8.8 8.8   Liver Function Tests:  Recent Labs Lab 10/05/12 1030 10/06/12 0215  AST 17 15  ALT 7 6  ALKPHOS 83 64  BILITOT 0.3 0.1*  PROT 6.2 4.8*  ALBUMIN 2.6* 2.1*   No results found for this basename: LIPASE, AMYLASE,  in the last 168 hours No results found for this basename: AMMONIA,  in the last 168 hours CBC:  Recent Labs Lab 10/05/12 1030  10/06/12 0215 10/06/12 2000 10/07/12 0346 10/08/12 0340 10/08/12 1400 10/09/12 0404  WBC 6.1  --  5.2  --  3.1* 3.3* 5.0 4.9  NEUTROABS 3.9  --   --   --   --   --   --   --   HGB 9.6*  < > 7.7* 8.2* 8.7* 8.8* 9.7* 9.0*  HCT 31.6*  < > 25.4* 26.5* 28.6* 28.2* 29.9* 28.3*  MCV 87.5  --  87.0  --  85.6 83.7 83.1 83.2  PLT 300  --  188  --  242 236 262 234  < > = values in this interval not displayed. Cardiac Enzymes:  Recent Labs Lab 10/05/12 1955 10/06/12 0215  TROPONINI <0.30 <0.30   BNP (last 3 results)  Recent Labs  05/21/12 0506 07/03/12 0630  PROBNP 383.1 674.3*   CBG: No results found for this basename: GLUCAP,  in the last 168 hours  Recent Results (from the past 240 hour(s))  CULTURE, BLOOD (ROUTINE X 2)     Status: None   Collection Time    10/05/12 10:30 AM      Result Value Range Status   Specimen Description BLOOD RIGHT ANTECUBITAL   Final   Special Requests BOTTLES DRAWN AEROBIC AND ANAEROBIC   Final   Culture  Setup Time 10/05/2012 13:59    Final   Culture     Final   Value:        BLOOD CULTURE RECEIVED NO GROWTH TO DATE CULTURE WILL BE HELD FOR 5 DAYS BEFORE ISSUING A FINAL NEGATIVE REPORT   Report Status PENDING   Incomplete  CULTURE, BLOOD (ROUTINE X 2)     Status: None   Collection Time    10/05/12 10:35 AM      Result Value Range Status   Specimen Description BLOOD RIGHT FOREARM   Final   Special Requests BOTTLES DRAWN AEROBIC ONLY   Final   Culture  Setup Time 10/05/2012 13:59   Final   Culture     Final   Value:  BLOOD CULTURE RECEIVED NO GROWTH TO DATE CULTURE WILL BE HELD FOR 5 DAYS BEFORE ISSUING A FINAL NEGATIVE REPORT   Report Status PENDING   Incomplete  URINE CULTURE     Status: None   Collection Time    10/05/12 11:02 AM      Result Value Range Status   Specimen Description URINE, CATHETERIZED   Final   Special Requests NONE   Final   Culture  Setup Time 10/05/2012 14:35   Final   Colony Count NO GROWTH   Final   Culture NO GROWTH   Final   Report Status 10/06/2012 FINAL   Final  MRSA PCR SCREENING     Status: None   Collection Time    10/05/12  3:32 PM      Result Value Range Status   MRSA by PCR NEGATIVE  NEGATIVE Final   Comment:            The GeneXpert MRSA Assay (FDA     approved for NASAL specimens     only), is one component of a     comprehensive MRSA colonization     surveillance program. It is not     intended to diagnose MRSA     infection nor to guide or     monitor treatment for     MRSA infections.     Studies: No results found.  Scheduled Meds: . albuterol  2.5 mg Nebulization Q6H  . ALPRAZolam  1 mg Oral TID  . antiseptic oral rinse  15 mL Mouth Rinse q12n4p  . benzonatate  100 mg Oral TID  . budesonide  0.25 mg Nebulization BID  . chlorhexidine  15 mL Mouth Rinse BID  . enoxaparin (LOVENOX) injection  40 mg Subcutaneous Q24H  . fluticasone  1 spray Each Nare Daily  . ipratropium  0.5 mg Nebulization Q6H  . levofloxacin (LEVAQUIN) IV  750 mg Intravenous  Q48H  . losartan  50 mg Oral Daily  . methylPREDNISolone (SOLU-MEDROL) injection  40 mg Intravenous Q24H  . pantoprazole  40 mg Oral Daily  . polyethylene glycol  17 g Oral BID  . senna-docusate  1 tablet Oral QHS  . sodium chloride  3 mL Intravenous Q12H  . vancomycin  500 mg Intravenous Q12H   Continuous Infusions: . sodium chloride 10 mL/hr (10/07/12 0948)    Principal Problem:   Hypotension Active Problems:   HYPERTENSION   ABDOMINAL AORTIC ANEURYSM   COPD (chronic obstructive pulmonary disease), Gold D    GERD (gastroesophageal reflux disease)   HTN (hypertension)   Chronic respiratory failure   Anxiety   ARF (acute renal failure)   CAP (community acquired pneumonia)   Unspecified constipation   Endoleak of aortic graft   Acute encephalopathy    Time spent: >    [facility]  [facility] Pager [phone]. If 7PM-7AM, please contact night-coverage at [email or web address], password [facility] 10/09/2012, 8:29 AM  LOS: 4 days

## 2012-10-09 NOTE — Progress Notes (Signed)
Spoke with Pt's son via phone.    Notified Pt's son that Pt has bed offers for SNF and that CSW will leave these in Pt's room for his review.  Pt's son was pleased and will review offers over the weekend.  CSW thanked Pt's son for his time.  Left bed offers in Pt's room.  RN put them in Pt's bag.  Providence Crosby, LCSWA Clinical Social Work [phone]

## 2012-10-09 NOTE — Progress Notes (Signed)
Patient transferring to room 1424.  Report called to Maralyn Sago, RN.  Patient to travel by wheelchair.  Will continue to monitor.

## 2012-10-10 DIAGNOSIS — E876 Hypokalemia: Secondary | ICD-10-CM | POA: Diagnosis not present

## 2012-10-10 LAB — CBC
HCT: 27.3 % — ABNORMAL LOW (ref 36.0–46.0)
MCH: 26 pg (ref 26.0–34.0)
MCV: 83.5 fL (ref 78.0–100.0)
RBC: 3.27 MIL/uL — ABNORMAL LOW (ref 3.87–5.11)
WBC: 5.6 10*3/uL (ref 4.0–10.5)

## 2012-10-10 LAB — BASIC METABOLIC PANEL
CO2: 30 mEq/L (ref 19–32)
Chloride: 106 mEq/L (ref 96–112)
Glucose, Bld: 87 mg/dL (ref 70–99)
Sodium: 140 mEq/L (ref 135–145)

## 2012-10-10 MED ORDER — POTASSIUM CHLORIDE CRYS ER 20 MEQ PO TBCR
40.0000 meq | EXTENDED_RELEASE_TABLET | Freq: Once | ORAL | Status: AC
  Administered 2012-10-10: 40 meq via ORAL
  Filled 2012-10-10 (×2): qty 2

## 2012-10-10 MED ORDER — BENZONATATE 100 MG PO CAPS
200.0000 mg | ORAL_CAPSULE | Freq: Three times a day (TID) | ORAL | Status: DC
  Administered 2012-10-10 – 2012-10-13 (×9): 200 mg via ORAL
  Filled 2012-10-10 (×11): qty 2

## 2012-10-10 MED ORDER — METHYLPREDNISOLONE SODIUM SUCC 40 MG IJ SOLR
40.0000 mg | Freq: Two times a day (BID) | INTRAMUSCULAR | Status: DC
  Administered 2012-10-10 – 2012-10-11 (×2): 40 mg via INTRAVENOUS
  Filled 2012-10-10 (×4): qty 1

## 2012-10-10 MED ORDER — LEVOFLOXACIN 750 MG PO TABS
750.0000 mg | ORAL_TABLET | ORAL | Status: DC
  Administered 2012-10-11: 750 mg via ORAL
  Filled 2012-10-10: qty 1

## 2012-10-10 NOTE — Progress Notes (Signed)
[facility] PROGRESS NOTE  [name] Ketter WUJ:[id number] DOB: 25-Jan-1931 DOA: 10/05/2012 PCP: [name], MD  Assessment/Plan: . Hypotension -Unclear etiology, possibly secondary to infection/pneumonia given the patient was hypothermic as well, but Lactate is normal  -Improved with IV fluids and empiric antibiotics- Vanco and Levaquin.  - CT angiogram of the abdomen with a type II endoleak. Per vascular surgery continue observation as 90% the spontaneously occlude. Follow. Patient's blood pressure is stable would resumption of patient's losartan.  -random cortisol level normal. U. A- and urine cultures so far no growth . Probable pneumonia  withSepsis Syndrome  -As discussed above, sputum with clear mucus. Blood cultures pending with no growth to date. Change IV Levaquin to oral levaquin. Continue tessalon perles. Acute encephalopathy Likely secondary to steroid psychosis versus sundowning. Clinical improvement after decrease of Solu-Medrol and resumption of patient's home dose Xanax.  Patient with no neurological deficits. Follow.  . ARF (acute renal failure)  -likely pre-renal. Improving with IVF. Losartan has been resumed monitor renal function. .Anemia  -Hb 9.6>> 7.7 >> 8.7 >> 8.8 >> 9.0 >>8.5 this a.m., with hydration -Follow and transfuse as above, CTA with a type II endovascular leak. Follow.  Hypokalemia -Repleted.  . Acute mild COPD (chronic obstructive pulmonary disease), Gold D  -has chronic SOB-with some mild wheezing -Continue on scheduled nebulized bronchodilators. -Increase IV Solu-Medrol 40 mg IV q12.  . Anxiety  - Continue Xanax to 1 mg by mouth 3 times a day which was her home dose.  [name] HYPERTENSION  - Continue losartan. Continue to hold Lasix.  . ABDOMINAL AORTIC ANEURYSM STATUS post repair/type II endoleak -Has a h/o of endo-vascular AAA repair -CT angiogram of abdomen with a type II endoleak. Patient's blood pressure is now stable although she was  hypotensive on admission. Hemoglobin is 8.5 from 9 from 8.8 from 8.7. Patient has been seen  by vascular surgery and recommend observation as 90% of the spontaneously occlude. Per vascular surgery no evidence of rupture or acute intra-abdominal pathology. Vascular surgery following and appreciate input and recommendations.   [name] GERD (gastroesophageal reflux disease)  -likely the cause for epigastric pain  -continue on PPI  -prn Antacids  Constipation Per abdominal xray. Patient with good BM yesterday. Continue miralax BID and senokot and follow. Abdominal Pain ??etiology. Clinical improvement. Maybe secondary to GERD vs constipation vs aneurysmal. CT angio abdomen with a type II endoleak.  Follow. Supportive treatment.     Code Status:DNR Family Communication: No family present Disposition Plan: Transfer to telemetry. Likely needs SNF.   Consultants:  Vascular surgery: Dr. Imogene Burn  4/ 24/ 2014  Procedures:  CTA  abdomen 10/07/12  Antibiotics:  IV Vanc 10/05/12 >>>10/09/12  IV Levaquin 10/05/2012  HPI/Subjective: Patient states epigastric pain improving. Patient alert and oriented. Not confused this morning.    Objective: Filed Vitals:   10/10/12 0439 10/10/12 0500 10/10/12 0900 10/10/12 0905  BP: 146/87     Pulse: 101     Temp: 97.5 F (36.4 C)     TempSrc: Oral     Resp: 16     Height:      Weight:  62.143 kg (137 lb)    SpO2: 100%  98% 96%    Intake/Output Summary (Last 24 hours) at 10/10/12 1141 Last data filed at 10/10/12 0000  Gross per 24 hour  Intake    120 ml  Output    250 ml  Net   -130 ml   Filed Weights   10/05/12 1546  10/07/12 0349 10/10/12 0500  Weight: 56.8 kg (125 lb 3.5 oz) 60 kg (132 lb 4.4 oz) 62.143 kg (137 lb)    Exam:   General:  Elderly female, alert and appropriate in no apparent distress. Nontoxic appearing  Cardiovascular: Regular rate and rhythm, normal S1-S2  Respiratory: expiratory wheezes diffusely  Abdomen: Soft, bowel  sounds present, NTTP, nondistended, no guarding and no masses palpable.  Extremities: No cyanosis and no edema  Data Reviewed: Basic Metabolic Panel:  Recent Labs Lab 10/06/12 0215 10/07/12 0346 10/08/12 0340 10/09/12 0404 10/10/12 0443  NA 141 140 137 141 140  K 3.2* 4.3 4.1 4.2 3.3*  CL 106 107 104 107 106  CO2 28 25 27 28 30   GLUCOSE 80 133* 139* 106* 87  BUN 12 9 12 16 17   CREATININE 1.08 0.93 1.01 1.04 1.08  CALCIUM 8.0* 8.7 8.8 8.8 8.6   Liver Function Tests:  Recent Labs Lab 10/05/12 1030 10/06/12 0215  AST 17 15  ALT 7 6  ALKPHOS 83 64  BILITOT 0.3 0.1*  PROT 6.2 4.8*  ALBUMIN 2.6* 2.1*   No results found for this basename: LIPASE, AMYLASE,  in the last 168 hours No results found for this basename: AMMONIA,  in the last 168 hours CBC:  Recent Labs Lab 10/05/12 1030  10/07/12 0346 10/08/12 0340 10/08/12 1400 10/09/12 0404 10/10/12 0443  WBC 6.1  < > 3.1* 3.3* 5.0 4.9 5.6  NEUTROABS 3.9  --   --   --   --   --   --   HGB 9.6*  < > 8.7* 8.8* 9.7* 9.0* 8.5*  HCT 31.6*  < > 28.6* 28.2* 29.9* 28.3* 27.3*  MCV 87.5  < > 85.6 83.7 83.1 83.2 83.5  PLT 300  < > 242 236 262 234 230  < > = values in this interval not displayed. Cardiac Enzymes:  Recent Labs Lab 10/05/12 1955 10/06/12 0215  TROPONINI <0.30 <0.30   BNP (last 3 results)  Recent Labs  05/21/12 0506 07/03/12 0630  PROBNP 383.1 674.3*   CBG:  Recent Labs Lab 10/09/12 1612  GLUCAP 119*    Recent Results (from the past 240 hour(s))  CULTURE, BLOOD (ROUTINE X 2)     Status: None   Collection Time    10/05/12 10:30 AM      Result Value Range Status   Specimen Description BLOOD RIGHT ANTECUBITAL   Final   Special Requests BOTTLES DRAWN AEROBIC AND ANAEROBIC   Final   Culture  Setup Time 10/05/2012 13:59   Final   Culture     Final   Value:        BLOOD CULTURE RECEIVED NO GROWTH TO DATE CULTURE WILL BE HELD FOR 5 DAYS BEFORE ISSUING A FINAL NEGATIVE REPORT   Report Status  PENDING   Incomplete  CULTURE, BLOOD (ROUTINE X 2)     Status: None   Collection Time    10/05/12 10:35 AM      Result Value Range Status   Specimen Description BLOOD RIGHT FOREARM   Final   Special Requests BOTTLES DRAWN AEROBIC ONLY   Final   Culture  Setup Time 10/05/2012 13:59   Final   Culture     Final   Value:        BLOOD CULTURE RECEIVED NO GROWTH TO DATE CULTURE WILL BE HELD FOR 5 DAYS BEFORE ISSUING A FINAL NEGATIVE REPORT   Report Status PENDING   Incomplete  URINE  CULTURE     Status: None   Collection Time    10/05/12 11:02 AM      Result Value Range Status   Specimen Description URINE, CATHETERIZED   Final   Special Requests NONE   Final   Culture  Setup Time 10/05/2012 14:35   Final   Colony Count NO GROWTH   Final   Culture NO GROWTH   Final   Report Status 10/06/2012 FINAL   Final  MRSA PCR SCREENING     Status: None   Collection Time    10/05/12  3:32 PM      Result Value Range Status   MRSA by PCR NEGATIVE  NEGATIVE Final   Comment:            The GeneXpert MRSA Assay (FDA     approved for NASAL specimens     only), is one component of a     comprehensive MRSA colonization     surveillance program. It is not     intended to diagnose MRSA     infection nor to guide or     monitor treatment for     MRSA infections.     Studies: No results found.  Scheduled Meds: . albuterol  2.5 mg Nebulization Q6H  . ALPRAZolam  1 mg Oral TID  . antiseptic oral rinse  15 mL Mouth Rinse q12n4p  . benzonatate  200 mg Oral TID  . budesonide  0.25 mg Nebulization BID  . chlorhexidine  15 mL Mouth Rinse BID  . enoxaparin (LOVENOX) injection  40 mg Subcutaneous Q24H  . fluticasone  1 spray Each Nare Daily  . ipratropium  0.5 mg Nebulization Q6H  . levofloxacin (LEVAQUIN) IV  750 mg Intravenous Q48H  . losartan  50 mg Oral Daily  . methylPREDNISolone (SOLU-MEDROL) injection  40 mg Intravenous Q12H  . pantoprazole  40 mg Oral Daily  . polyethylene glycol  17 g Oral  BID  . senna-docusate  1 tablet Oral QHS  . sodium chloride  3 mL Intravenous Q12H   Continuous Infusions: . sodium chloride 10 mL/hr (10/07/12 0948)    Principal Problem:   Hypotension Active Problems:   HYPERTENSION   ABDOMINAL AORTIC ANEURYSM   COPD (chronic obstructive pulmonary disease), Gold D    GERD (gastroesophageal reflux disease)   HTN (hypertension)   Chronic respiratory failure   Anxiety   ARF (acute renal failure)   CAP (community acquired pneumonia)   Unspecified constipation   Endoleak of aortic graft   Acute encephalopathy   Hypokalemia    Time spent: >    [facility]  [facility] Pager [phone]. If 7PM-7AM, please contact night-coverage at [email or web address], password [facility] 10/10/2012, 11:41 AM  LOS: 5 days

## 2012-10-10 NOTE — Progress Notes (Signed)
PHARMACIST - PHYSICIAN COMMUNICATION DR:   [facility] CONCERNING: Antibiotic IV to Oral Route Change Policy  RECOMMENDATION: This patient is receiving levofloxacin by the intravenous route.  Based on criteria approved by the Pharmacy and Therapeutics Committee, the antibiotic(s) is/are being converted to the equivalent oral dose form(s).   DESCRIPTION: These criteria include:  Patient being treated for a respiratory tract infection, urinary tract infection, or cellulitis  The patient is not neutropenic and does not exhibit a GI malabsorption state  The patient is eating (either orally or via tube) and/or has been taking other orally administered medications for a least 24 hours  The patient is improving clinically and has a Tmax < 100.5  If you have questions about this conversion, please contact the Pharmacy Department  []   [phone] )  [facility] []   [phone] )  [facility]  []   [phone] )  [facility] [x]   [phone] )  Ilene Qua   Juliette Alcide, PharmD, BCPS.   Pager: [phone] 10/10/2012 11:58 AM

## 2012-10-10 NOTE — Progress Notes (Signed)
RT educated pt on use of nebs. Pt see's Dr. Sherene Sires for pulmonologist.

## 2012-10-11 LAB — BASIC METABOLIC PANEL
CO2: 31 mEq/L (ref 19–32)
Calcium: 8.8 mg/dL (ref 8.4–10.5)
Creatinine, Ser: 0.96 mg/dL (ref 0.50–1.10)
GFR calc Af Amer: 64 mL/min — ABNORMAL LOW (ref >90)
GFR calc non Af Amer: 55 mL/min — ABNORMAL LOW (ref >90)

## 2012-10-11 LAB — CULTURE, BLOOD (ROUTINE X 2): Culture: NO GROWTH

## 2012-10-11 LAB — CBC
MCH: 26.5 pg (ref 26.0–34.0)
MCHC: 31.9 g/dL (ref 30.0–36.0)
MCV: 83.2 fL (ref 78.0–100.0)
Platelets: 220 10*3/uL (ref 150–400)
RDW: 13.9 % (ref 11.5–15.5)

## 2012-10-11 MED ORDER — ADULT MULTIVITAMIN W/MINERALS CH
1.0000 | ORAL_TABLET | Freq: Every day | ORAL | Status: DC
  Administered 2012-10-11 – 2012-10-13 (×3): 1 via ORAL
  Filled 2012-10-11 (×3): qty 1

## 2012-10-11 MED ORDER — MORPHINE SULFATE 15 MG PO TABS
15.0000 mg | ORAL_TABLET | Freq: Three times a day (TID) | ORAL | Status: DC
  Administered 2012-10-11 – 2012-10-13 (×6): 15 mg via ORAL
  Filled 2012-10-11 (×4): qty 1
  Filled 2012-10-11: qty 3
  Filled 2012-10-11: qty 1

## 2012-10-11 MED ORDER — PANTOPRAZOLE SODIUM 40 MG PO TBEC
40.0000 mg | DELAYED_RELEASE_TABLET | Freq: Two times a day (BID) | ORAL | Status: DC
  Administered 2012-10-11 – 2012-10-13 (×4): 40 mg via ORAL
  Filled 2012-10-11 (×7): qty 1

## 2012-10-11 MED ORDER — LEVOFLOXACIN 500 MG PO TABS
500.0000 mg | ORAL_TABLET | ORAL | Status: DC
  Administered 2012-10-13: 500 mg via ORAL
  Filled 2012-10-11: qty 1

## 2012-10-11 MED ORDER — PANTOPRAZOLE SODIUM 40 MG PO TBEC: 40.0000 mg | DELAYED_RELEASE_TABLET | Freq: Every day | ORAL | Status: DC

## 2012-10-11 MED ORDER — METHYLPREDNISOLONE 4 MG PO TABS
24.0000 mg | ORAL_TABLET | Freq: Every day | ORAL | Status: DC
  Administered 2012-10-12 – 2012-10-13 (×2): 24 mg via ORAL
  Filled 2012-10-11 (×4): qty 2

## 2012-10-11 MED ORDER — ENSURE PUDDING PO PUDG
1.0000 | ORAL | Status: DC
  Administered 2012-10-11 – 2012-10-13 (×2): 1 via ORAL
  Filled 2012-10-11 (×3): qty 1

## 2012-10-11 NOTE — Progress Notes (Signed)
INITIAL NUTRITION ASSESSMENT  DOCUMENTATION CODES Per approved criteria  -Not Applicable   INTERVENTION: Provide Ensure Pudding once daily Provide Magic Cup once daily Provide Multivitamin with minerals daily Encourage po intake >/= 75% of meals  NUTRITION DIAGNOSIS: Inadequate oral intake related to poor appetite/depression as evidenced by pt report of eating 25-50% of most meals.   Goal: Pt to meet >/= 90% of their estimated nutrition needs  Monitor:  PO intake Wt Labs  Reason for Assessment: Consult to assess nutrition status/requirements  79 y.o. female  Admitting Dx: Hypotension  ASSESSMENT: 79 y.o. female with a Past Medical History of COPD on home O2, HTN, who presents today with complaints of cough and epigastric pain. Claims to have worsening non productive cough and some worsening of her baseline COPD. Pt reports that she has had a decreased appetite and eating poorly since her husband passed away 4 weeks ago. Pt states she has been eating 3 meals daily but, only 25-50% of meals. Pt usually weighs 110 lbs but, has gained wt due to being on steroids per pt report. Pt states that her appetite is better today but, she continues to get full fast. Pt also reports having a peanut food allergy.  Height: Ht Readings from Last 1 Encounters:  10/05/12 5' (1.524 m)    Weight: Wt Readings from Last 1 Encounters:  10/11/12 136 lb 6.4 oz (61.871 kg)    Ideal Body Weight: 100 lbs  % Ideal Body Weight: 136%  Wt Readings from Last 10 Encounters:  10/11/12 136 lb 6.4 oz (61.871 kg)  08/27/12 126 lb 12.8 oz (57.516 kg)  08/11/12 129 lb (58.514 kg)  07/06/12 131 lb (59.421 kg)  06/30/12 130 lb (58.968 kg)  06/24/12 137 lb (62.143 kg)  06/01/12 138 lb (62.596 kg)  05/27/12 135 lb (61.236 kg)  05/21/12 137 lb 12.6 oz (62.5 kg)  05/20/12 133 lb (60.328 kg)    Usual Body Weight: 110 lbs  % Usual Body Weight: 124%  BMI:  Body mass index is 26.64 kg/(m^2).  Estimated  Nutritional Needs: Kcal: 1380-1610 Protein: 62-74 grams Fluid: 1.8 L  Skin: skin tear on right arm  Diet Order: General  EDUCATION NEEDS: -No education needs identified at this time   Intake/Output Summary (Last 24 hours) at 10/11/12 1225 Last data filed at 10/11/12 0700  Gross per 24 hour  Intake    120 ml  Output      0 ml  Net    120 ml    Last BM: 4/28  Labs:   Recent Labs Lab 10/09/12 0404 10/10/12 0443 10/11/12 0450  NA 141 140 139  K 4.2 3.3* 4.4  CL 107 106 105  CO2 28 30 31   BUN 16 17 15   CREATININE 1.04 1.08 0.96  CALCIUM 8.8 8.6 8.8  GLUCOSE 106* 87 138*    CBG (last 3)   Recent Labs  10/09/12 1612  GLUCAP 119*    Scheduled Meds: . albuterol  2.5 mg Nebulization Q6H  . ALPRAZolam  1 mg Oral TID  . antiseptic oral rinse  15 mL Mouth Rinse q12n4p  . benzonatate  200 mg Oral TID  . budesonide  0.25 mg Nebulization BID  . chlorhexidine  15 mL Mouth Rinse BID  . enoxaparin (LOVENOX) injection  40 mg Subcutaneous Q24H  . fluticasone  1 spray Each Nare Daily  . ipratropium  0.5 mg Nebulization Q6H  . levofloxacin  750 mg Oral Q48H  . losartan  50 mg  Oral Daily  . methylPREDNISolone (SOLU-MEDROL) injection  40 mg Intravenous Q12H  . morphine  15 mg Oral TID  . [START ON 10/12/2012] pantoprazole  40 mg Oral Daily  . polyethylene glycol  17 g Oral BID  . senna-docusate  1 tablet Oral QHS  . sodium chloride  3 mL Intravenous Q12H    Continuous Infusions: . sodium chloride 10 mL/hr (10/07/12 1610)    Past Medical History  Diagnosis Date  . History of colonic polyps   . COPD (chronic obstructive pulmonary disease)   . Diverticulitis of colon   . GERD (gastroesophageal reflux disease)   . HTN (hypertension)   . Osteoarthritis   . Hyperlipemia   . Aortic aneurysm, abdominal   . Ovarian cyst, left   . Psoriasis   . Chronic respiratory failure     Uses home oxygen at 2 L  . ADRENAL MASS 05/04/2007  . GLAUCOMA 03/30/2007  . HEMORRHOIDS,  INTERNAL 03/23/2008  . OSTEOPOROSIS 03/30/2007  . SCOLIOSIS 03/30/2007  . PONV (postoperative nausea and vomiting)     Past Surgical History  Procedure Laterality Date  . Kidney stone surgery    . Appendectomy    . Tonsillectomy    . Hemiarthroplasty shoulder fracture    . Cataract extraction, bilateral    . Abdominal aortic aneurysm repair    . Abdominal hysterectomy      Ian Malkin RD, LDN Inpatient Clinical Dietitian Pager: [phone] After Hours Pager: [phone]

## 2012-10-11 NOTE — Progress Notes (Signed)
[facility] PROGRESS NOTE  [name] ZOX:[id number] DOB: [birth year] DOA: 10/05/2012 PCP: [name], MD  Assessment/Plan: . Hypotension -Unclear etiology, possibly secondary to infection/pneumonia given the patient was hypothermic as well, but Lactate is normal  -Improved with IV fluids and empiric antibiotics- Vanco and Levaquin.  - CT angiogram of the abdomen with a type II endoleak. Per vascular surgery continue observation as 90% the spontaneously occlude. Follow. Patient's blood pressure is stable with resumption of patient's losartan.  -random cortisol level normal. U. A- and urine cultures so far no growth . Probable pneumonia  withSepsis Syndrome  -As discussed above, sputum with clear mucus. Blood cultures pending with no growth to date. Continue oral levaquin. Continue tessalon perles. Acute encephalopathy Likely secondary to steroid psychosis versus sundowning. Clinical improvement after decrease of Solu-Medrol and resumption of patient's home dose Xanax.  Patient with no neurological deficits. Follow.  . ARF (acute renal failure)  -likely pre-renal. Improving with IVF. Losartan has been resumed monitor renal function. .Anemia  -Hb 9.6>> 7.7 >> 8.7 >> 8.8 >> 9.0 >>8.5>>9  this a.m., with hydration -Follow and transfuse as above, CTA with a type II endovascular leak. Follow.  Hypokalemia -Repleted.  . Acute mild COPD (chronic obstructive pulmonary disease), Gold D  -has chronic SOB-with some mild wheezing -Continue on scheduled nebulized bronchodilators. -Continue IV Solu-Medrol 40 mg IV q12.  Will add MSIR 15mg  TID. [name] Anxiety  - Continue Xanax to 1 mg by mouth 3 times a day which was her home dose.  [name] HYPERTENSION  - Continue losartan. Continue to hold Lasix.  . ABDOMINAL AORTIC ANEURYSM STATUS post repair/type II endoleak -Has a h/o of endo-vascular AAA repair -CT angiogram of abdomen with a type II endoleak. Patient's blood pressure is now stable although  she was hypotensive on admission. Hemoglobin is 8.5 from 9 from 8.8 from 8.7. Patient has been seen  by vascular surgery and recommend observation as 90% of the spontaneously occlude. Per vascular surgery no evidence of rupture or acute intra-abdominal pathology. Vascular surgery following and appreciate input and recommendations.   [name] GERD (gastroesophageal reflux disease)  -likely the cause for epigastric pain  -continue on PPI  -prn Antacids  Constipation Per abdominal xray. Patient with good BM yesterday. Continue miralax BID and senokot and follow. Abdominal Pain ??etiology. Clinical improvement. Maybe secondary to GERD vs constipation vs aneurysmal. CT angio abdomen with a type II endoleak.  Follow. Supportive treatment.     Code Status:DNR Family Communication: No family present Disposition Plan: Transfer to telemetry. Likely needs SNF.   Consultants:  Vascular surgery: Dr. Imogene Burn  4/ 24/ 2014  Procedures:  CTA  abdomen 10/07/12  Antibiotics:  IV Vanc 10/05/12 >>>10/09/12  IV Levaquin 10/05/2012  HPI/Subjective: Patient states epigastric pain improving. Patient alert and oriented. Not confused this morning. Patient states was on morphine for her COPD and will like to be put back on it. Patient doesnot want to go to SNF.  Objective: Filed Vitals:   10/11/12 0237 10/11/12 0500 10/11/12 0521 10/11/12 0850  BP:   174/93   Pulse:   84   Temp:   98.1 F (36.7 C)   TempSrc:   Oral   Resp:   18   Height:      Weight:  61.871 kg (136 lb 6.4 oz)    SpO2: 98%  100% 96%    Intake/Output Summary (Last 24 hours) at 10/11/12 1054 Last data filed at 10/11/12 0700  Gross per 24 hour  Intake    120 ml  Output      0 ml  Net    120 ml   Filed Weights   10/07/12 0349 10/10/12 0500 10/11/12 0500  Weight: 60 kg (132 lb 4.4 oz) 62.143 kg (137 lb) 61.871 kg (136 lb 6.4 oz)    Exam:   General:  Elderly female, alert and appropriate in no apparent distress. Nontoxic  appearing  Cardiovascular: Regular rate and rhythm, normal S1-S2  Respiratory: mild expiratory wheezes diffusely  Abdomen: Soft, bowel sounds present, NTTP, nondistended, no guarding and no masses palpable.  Extremities: No cyanosis and no edema  Data Reviewed: Basic Metabolic Panel:  Recent Labs Lab 10/07/12 0346 10/08/12 0340 10/09/12 0404 10/10/12 0443 10/11/12 0450  NA 140 137 141 140 139  K 4.3 4.1 4.2 3.3* 4.4  CL 107 104 107 106 105  CO2 25 27 28 30 31   GLUCOSE 133* 139* 106* 87 138*  BUN 9 12 16 17 15   CREATININE 0.93 1.01 1.04 1.08 0.96  CALCIUM 8.7 8.8 8.8 8.6 8.8   Liver Function Tests:  Recent Labs Lab 10/05/12 1030 10/06/12 0215  AST 17 15  ALT 7 6  ALKPHOS 83 64  BILITOT 0.3 0.1*  PROT 6.2 4.8*  ALBUMIN 2.6* 2.1*   No results found for this basename: LIPASE, AMYLASE,  in the last 168 hours No results found for this basename: AMMONIA,  in the last 168 hours CBC:  Recent Labs Lab 10/05/12 1030  10/08/12 0340 10/08/12 1400 10/09/12 0404 10/10/12 0443 10/11/12 0450  WBC 6.1  < > 3.3* 5.0 4.9 5.6 4.4  NEUTROABS 3.9  --   --   --   --   --   --   HGB 9.6*  < > 8.8* 9.7* 9.0* 8.5* 9.0*  HCT 31.6*  < > 28.2* 29.9* 28.3* 27.3* 28.2*  MCV 87.5  < > 83.7 83.1 83.2 83.5 83.2  PLT 300  < > 236 262 234 230 220  < > = values in this interval not displayed. Cardiac Enzymes:  Recent Labs Lab 10/05/12 1955 10/06/12 0215  TROPONINI <0.30 <0.30   BNP (last 3 results)  Recent Labs  05/21/12 0506 07/03/12 0630  PROBNP 383.1 674.3*   CBG:  Recent Labs Lab 10/09/12 1612  GLUCAP 119*    Recent Results (from the past 240 hour(s))  CULTURE, BLOOD (ROUTINE X 2)     Status: None   Collection Time    10/05/12 10:30 AM      Result Value Range Status   Specimen Description BLOOD RIGHT ANTECUBITAL   Final   Special Requests BOTTLES DRAWN AEROBIC AND ANAEROBIC   Final   Culture  Setup Time 10/05/2012 13:59   Final   Culture NO GROWTH 5 DAYS    Final   Report Status 10/11/2012 FINAL   Final  CULTURE, BLOOD (ROUTINE X 2)     Status: None   Collection Time    10/05/12 10:35 AM      Result Value Range Status   Specimen Description BLOOD RIGHT FOREARM   Final   Special Requests BOTTLES DRAWN AEROBIC ONLY   Final   Culture  Setup Time 10/05/2012 13:59   Final   Culture NO GROWTH 5 DAYS   Final   Report Status 10/11/2012 FINAL   Final  URINE CULTURE     Status: None   Collection Time    10/05/12 11:02 AM      Result  Value Range Status   Specimen Description URINE, CATHETERIZED   Final   Special Requests NONE   Final   Culture  Setup Time 10/05/2012 14:35   Final   Colony Count NO GROWTH   Final   Culture NO GROWTH   Final   Report Status 10/06/2012 FINAL   Final  MRSA PCR SCREENING     Status: None   Collection Time    10/05/12  3:32 PM      Result Value Range Status   MRSA by PCR NEGATIVE  NEGATIVE Final   Comment:            The GeneXpert MRSA Assay (FDA     approved for NASAL specimens     only), is one component of a     comprehensive MRSA colonization     surveillance program. It is not     intended to diagnose MRSA     infection nor to guide or     monitor treatment for     MRSA infections.     Studies: No results found.  Scheduled Meds: . albuterol  2.5 mg Nebulization Q6H  . ALPRAZolam  1 mg Oral TID  . antiseptic oral rinse  15 mL Mouth Rinse q12n4p  . benzonatate  200 mg Oral TID  . budesonide  0.25 mg Nebulization BID  . chlorhexidine  15 mL Mouth Rinse BID  . enoxaparin (LOVENOX) injection  40 mg Subcutaneous Q24H  . fluticasone  1 spray Each Nare Daily  . ipratropium  0.5 mg Nebulization Q6H  . levofloxacin  750 mg Oral Q48H  . losartan  50 mg Oral Daily  . methylPREDNISolone (SOLU-MEDROL) injection  40 mg Intravenous Q12H  . morphine  15 mg Oral TID  . [START ON 10/12/2012] pantoprazole  40 mg Oral Daily  . polyethylene glycol  17 g Oral BID  . senna-docusate  1 tablet Oral QHS  .  sodium chloride  3 mL Intravenous Q12H   Continuous Infusions: . sodium chloride 10 mL/hr (10/07/12 0948)    Principal Problem:   Hypotension Active Problems:   HYPERTENSION   ABDOMINAL AORTIC ANEURYSM   COPD (chronic obstructive pulmonary disease), Gold D    GERD (gastroesophageal reflux disease)   HTN (hypertension)   Chronic respiratory failure   Anxiety   ARF (acute renal failure)   CAP (community acquired pneumonia)   Unspecified constipation   Endoleak of aortic graft   Acute encephalopathy   Hypokalemia    Time spent: >    [facility]  [facility] Pager [phone]. If 7PM-7AM, please contact night-coverage at [email or web address], password [facility] 10/11/2012, 10:54 AM  LOS: 6 days

## 2012-10-11 NOTE — Consult Note (Signed)
PULMONARY  / CRITICAL CARE MEDICINE  Name: [name] MRN: [id number] DOB: [birth year]    ADMISSION DATE:  10/05/2012 CONSULTATION DATE:  4/28  REFERRING MD :  [name]  PRIMARY SERVICE:  [facility]   CHIEF COMPLAINT:  Dyspnea   BRIEF PATIENT DESCRIPTION:  This is a 79 yof w/ Chronic resp failure in setting of GOLD D COPD. Admitted on 4/22 for CAP (NOS) and AECOPD. PCCM asked to see on 4/28 for wheezing and persistent dyspnea.   SIG EVENTS / STUDIES:     CULTURES:   ANTIBIOTICS: vanc 4/22 >>>4/28 levaquin 4/22>>  HISTORY OF PRESENT ILLNESS:   This is a 79 yof w/ chronic resp failure on basis of Gold D COPD. Admitted initially on 4/22 w/ cough and epigastric discomfort (has h/o reflux), this was following recent URI and several sick exposures w/ associated dyspnea. Dx eval: included: CXR, CT abd/pelvis. CT abd/pelvis raised concern for type II endovacular leak. She was treated for AECOPD and PNA w/ o2, BDs, steroids and abx. She had vasc eval who recommended conservative monitoring of the leak. Her course was complicated by agitated delirium felt to be due to higher dose steroids. She has continued to improve with antibiotics, oxygen, nebs and systemic steroids, but Sloop has some wheezing. PCCM has been asked to see on 4/28 for persistent wheezing.   PAST MEDICAL HISTORY :  Past Medical History  Diagnosis Date  . History of colonic polyps   . COPD (chronic obstructive pulmonary disease)   . Diverticulitis of colon   . GERD (gastroesophageal reflux disease)   . HTN (hypertension)   . Osteoarthritis   . Hyperlipemia   . Aortic aneurysm, abdominal   . Ovarian cyst, left   . Psoriasis   . Chronic respiratory failure     Uses home oxygen at 2 L  . ADRENAL MASS 05/04/2007  . GLAUCOMA 03/30/2007  . HEMORRHOIDS, INTERNAL 03/23/2008  . OSTEOPOROSIS 03/30/2007  . SCOLIOSIS 03/30/2007  . PONV (postoperative nausea and vomiting)    Past Surgical History  Procedure Laterality Date   . Kidney stone surgery    . Appendectomy    . Tonsillectomy    . Hemiarthroplasty shoulder fracture    . Cataract extraction, bilateral    . Abdominal aortic aneurysm repair    . Abdominal hysterectomy     Prior to Admission medications   Medication Sig Start Date End Date Taking? Authorizing Provider  acetaminophen (TYLENOL) 325 MG tablet Take 2 tablets (650 mg total) by mouth every 6 (six) hours as needed (or Fever >/= 101). 07/06/12  Yes Renae Fickle, MD  albuterol (PROVENTIL) (2.5 MG/3ML) 0.083% nebulizer solution Take 2.5 mg by nebulization 4 (four) times daily. 07/06/12  Yes Renae Fickle, MD  ALPRAZolam Prudy Feeler) 0.25 MG tablet Take 0.5 mg by mouth at bedtime.    Yes Historical Provider, MD  ALPRAZolam Prudy Feeler) 1 MG tablet Take 1 tablet three times a day scheduled 8am, 2pm, and 9pm for anxiety 09/01/12  Yes Claudie Revering, NP  Alum & Mag Hydroxide-Simeth (MAGIC MOUTHWASH W/LIDOCAINE) SOLN Take 5 mLs by mouth 4 (four) times daily as needed (pain). 07/06/12  Yes Renae Fickle, MD  antiseptic oral rinse (BIOTENE) LIQD 15 mLs by Mouth Rinse route as needed. 07/06/12  Yes Renae Fickle, MD  atorvastatin (LIPITOR) 40 MG tablet Take 40 mg by mouth daily.   Yes Historical Provider, MD  budesonide (PULMICORT) 0.25 MG/2ML nebulizer solution Take 2 mLs (0.25 mg total) by nebulization daily. 07/06/12  Yes Renae Fickle, MD  chlorpheniramine-HYDROcodone (TUSSIONEX) 10-8 MG/5ML LQCR Take 5 mLs by mouth every 12 (twelve) hours as needed. 07/06/12  Yes Renae Fickle, MD  Docusate Sodium (DSS) 100 MG CAPS Take 100 mg by mouth 2 (two) times daily. 04/30/12  Yes Lesle Chris Black, NP  doxycycline (VIBRA-TABS) 100 MG tablet Take 100 mg by mouth every 12 (twelve) hours. Pt's on day 4 of therapy. Started on 10-01-12 09/30/12  Yes Dianne Dun, MD  fluticasone [facility]) 50 MCG/ACT nasal spray Place 1 spray into the nose daily. 07/06/12  Yes Renae Fickle, MD  furosemide (LASIX) 20 MG tablet Take 1 tablet (20  mg total) by mouth daily. 07/06/12  Yes Renae Fickle, MD  losartan (COZAAR) 50 MG tablet Take 50 mg by mouth daily. Hold for sbp <  Or = 100   Yes Historical Provider, MD  morphine (MSIR) 15 MG tablet Take 1 tablet twice a day as needed for breakthrough pain 09/02/12  Yes Tiffany L Reed, DO  neomycin-bacitracin-polymyxin (NEOSPORIN) OINT Apply 1 application topically daily. 07/06/12  Yes Renae Fickle, MD  omeprazole (PRILOSEC) 20 MG capsule Take 20 mg by mouth daily.   Yes Historical Provider, MD  potassium chloride (MICRO-K) 10 MEQ CR capsule Take 10 mEq by mouth daily.  06/24/12 06/24/13 Yes Dianne Dun, MD  simethicone (GAS-X) 80 MG chewable tablet Chew 1 tablet (80 mg total) by mouth every 4 (four) hours as needed. 08/11/12  Yes Tammy S Parrett, NP  sodium chloride (OCEAN) 0.65 % SOLN nasal spray Place 1 spray into the nose as needed for congestion. 07/06/12  Yes Renae Fickle, MD  Spacer/Aero-Holding Chambers (AEROCHAMBER MV) inhaler Use as instructed 05/10/12  Yes Tammy Rogers Seeds, NP   Allergies  Allergen Reactions  . Clarithromycin     REACTION: abd pain  . Codeine   . Hydrocodone-Guaifenesin     REACTION: feels funny  . Iohexol      Desc: pt reports dyspnea and throat swelling about age 51 w/ IV contrast reaction when kidneys were being checked; 07/25/08 SLG   . Moxifloxacin     REACTION: hallucinations  . Penicillins   . Prednisone Other (See Comments)    Can take but at a lower dosage  . Simvastatin   . Sulfonamide Derivatives     REACTION: unknown    FAMILY HISTORY:  Family History  Problem Relation Age of Onset  . Heart attack Mother   . Heart attack Father   . Deep vein thrombosis Father   . Cancer Brother     Pancreas and colon   SOCIAL HISTORY:  reports that she quit smoking about 3 years ago. Her smoking use included Cigarettes. She has a 20 pack-year smoking history. She has never used smokeless tobacco. She reports that she does not drink alcohol or use  illicit drugs.  REVIEW OF SYSTEMS positives are bold:   Constitutional: Negative for fever, chills, weight loss, malaise/fatigue all improved and diaphoresis.  HENT: Negative for hearing loss, ear pain, nosebleeds,  Nasal d/c better, sore throat, neck pain, tinnitus and ear discharge.   Eyes: Negative for blurred vision, double vision, photophobia, pain, discharge and redness.  Respiratory: Negative for cough, better w/ suppression,  hemoptysis, sputum production, Schelling w/ some green discoloration shortness of breath, wheezing and stridor.   Cardiovascular: Negative for chest pain, left lower chest w/ deep breath palpitations, orthopnea, claudication, leg swelling and PND.  Gastrointestinal: Negative for heartburn, nausea, vomiting, abdominal pain, diarrhea, constipation, blood  in stool and melena.  Genitourinary: Negative for dysuria, urgency, frequency, hematuria and flank pain.  Musculoskeletal: Negative for myalgias, back pain, joint pain and falls.  Skin: Negative for itching and rash.  Neurological: Negative for dizziness, tingling, tremors, sensory change, speech change, focal weakness, seizures, loss of consciousness, weakness and headaches.  Endo/Heme/Allergies: Negative for environmental allergies and polydipsia. Does not bruise/bleed easily.  SUBJECTIVE:  Feels better.  VITAL SIGNS: Temp:  [98 F (36.7 C)-98.3 F (36.8 C)] 98.1 F (36.7 C) (04/28 0521) Pulse Rate:  [84-115] 84 (04/28 0521) Resp:  [18-22] 18 (04/28 0521) BP: (140-174)/(62-93) 174/93 mmHg (04/28 0521) SpO2:  [94 %-100 %] 96 % (04/28 0850) Weight:  [61.871 kg (136 lb 6.4 oz)] 61.871 kg (136 lb 6.4 oz) (04/28 0500) 3 liters  PHYSICAL EXAMINATION: General: no acute distress.  Neuro:  Awake, alert no focal def  HEENT:  Loud upper airway wheeze  Cardiovascular: rrr Lungs:  + wheeze, decreased LLL, rales both bases  Abdomen:  Non-tender Musculoskeletal:  Intact  Skin:  Intact    Recent Labs Lab  10/09/12 0404 10/10/12 0443 10/11/12 0450  NA 141 140 139  K 4.2 3.3* 4.4  CL 107 106 105  CO2 28 30 31   BUN 16 17 15   CREATININE 1.04 1.08 0.96  GLUCOSE 106* 87 138*    Recent Labs Lab 10/09/12 0404 10/10/12 0443 10/11/12 0450  HGB 9.0* 8.5* 9.0*  HCT 28.3* 27.3* 28.2*  WBC 4.9 5.6 4.4  PLT 234 230 220   No results found.  ASSESSMENT / PLAN: COPD (gold D) AECOPD,  LLL PNA (NOS): slowly clinically improving. She Porcaro has marked Upper-airway noises which are audible over entire chest.  Pleuritic CP Chronic Respiratory failure GERD: Vigeant c/o reflux and heart burn after large bolus of food intake.  Anxiety: a major contributing factor  ARF (improved Jenna Luo w/ IVFs).  Acute encephalopathy: felt to be exacerbated by high dose steroids  Anemia: hgb drift reflecting hemoconcentration Type II AAA endoleak: vasc following.   This is a pleasant 79 year old female w/ chronic resp failure in setting of GOLD D COPD. She was admitted for PNA and AECOPD. Has responded well symptomatically but Tengan has some Left chest wall pain w/ cough and audible wheeze. This wheeze is in the face of continued on-going intermittent GI distress. Think that her epigastric symptoms and reflux are causing significant upper airway irritation on top of already limited lower airway obstructive disease.   Recommendation Cont levaquin to 10d total rx Repeat CXR today (2 view) need to r/o effusion as source of this pleuritic CP Taper steroids Cont BDs Cont morphine Increase PPI to BID.  Consider esophagram  Home on oxygen  Dorcas Carrow [facility]  [phone]  Cell  [phone]  If no response or cell goes to voicemail, call beeper [phone]  Pager: [phone]  10/11/2012, 1:34 PM

## 2012-10-11 NOTE — Progress Notes (Signed)
ANTIBIOTIC CONSULT NOTE - FOLLOW UP  Pharmacy Consult for Levaquin Indication: Probably PNA/sepsis  Allergies  Allergen Reactions  . Clarithromycin     REACTION: abd pain  . Codeine   . Hydrocodone-Guaifenesin     REACTION: feels funny  . Iohexol      Desc: pt reports dyspnea and throat swelling about age 79 w/ IV contrast reaction when kidneys were being checked; 07/25/08 SLG   . Moxifloxacin     REACTION: hallucinations  . Penicillins   . Prednisone Other (See Comments)    Can take but at a lower dosage  . Simvastatin   . Sulfonamide Derivatives     REACTION: unknown    Patient Measurements: Height: 5' (152.4 cm) Weight: 136 lb 6.4 oz (61.871 kg) IBW/kg (Calculated) : 45.5 Adjusted Body Weight:   Vital Signs: Temp: 98.1 F (36.7 C) (04/28 0521) Temp src: Oral (04/28 0521) BP: 174/93 mmHg (04/28 0521) Pulse Rate: 84 (04/28 0521) Intake/Output from previous day: 04/27 0701 - 04/28 0700 In: 120 [P.O.:120] Out: -  Intake/Output from this shift:    Labs:  Recent Labs  10/09/12 0404 10/10/12 0443 10/11/12 0450  WBC 4.9 5.6 4.4  HGB 9.0* 8.5* 9.0*  PLT 234 230 220  CREATININE 1.04 1.08 0.96   Estimated Creatinine Clearance: 39.1 ml/min (by C-G formula based on Cr of 0.96). No results found for this basename: VANCOTROUGH, VANCOPEAK, VANCORANDOM, GENTTROUGH, GENTPEAK, GENTRANDOM, TOBRATROUGH, TOBRAPEAK, TOBRARND, AMIKACINPEAK, AMIKACINTROU, AMIKACIN,  in the last 72 hours   Microbiology: Recent Results (from the past 720 hour(s))  CULTURE, BLOOD (ROUTINE X 2)     Status: None   Collection Time    10/05/12 10:30 AM      Result Value Range Status   Specimen Description BLOOD RIGHT ANTECUBITAL   Final   Special Requests BOTTLES DRAWN AEROBIC AND ANAEROBIC   Final   Culture  Setup Time 10/05/2012 13:59   Final   Culture NO GROWTH 5 DAYS   Final   Report Status 10/11/2012 FINAL   Final  CULTURE, BLOOD (ROUTINE X 2)     Status: None   Collection Time   10/05/12 10:35 AM      Result Value Range Status   Specimen Description BLOOD RIGHT FOREARM   Final   Special Requests BOTTLES DRAWN AEROBIC ONLY   Final   Culture  Setup Time 10/05/2012 13:59   Final   Culture NO GROWTH 5 DAYS   Final   Report Status 10/11/2012 FINAL   Final  URINE CULTURE     Status: None   Collection Time    10/05/12 11:02 AM      Result Value Range Status   Specimen Description URINE, CATHETERIZED   Final   Special Requests NONE   Final   Culture  Setup Time 10/05/2012 14:35   Final   Colony Count NO GROWTH   Final   Culture NO GROWTH   Final   Report Status 10/06/2012 FINAL   Final  MRSA PCR SCREENING     Status: None   Collection Time    10/05/12  3:32 PM      Result Value Range Status   MRSA by PCR NEGATIVE  NEGATIVE Final   Comment:            The GeneXpert MRSA Assay (FDA     approved for NASAL specimens     only), is one component of a     comprehensive MRSA colonization  surveillance program. It is not     intended to diagnose MRSA     infection nor to guide or     monitor treatment for     MRSA infections.    Anti-infectives   Start     Dose/Rate Route Frequency Ordered Stop   10/11/12 1000  levofloxacin (LEVAQUIN) tablet 750 mg     750 mg Oral Every 48 hours 10/10/12 1159     10/07/12 1000  levofloxacin (LEVAQUIN) IVPB 750 mg  Status:  Discontinued     750 mg 100 mL/hr over 90 Minutes Intravenous Every 48 hours 10/05/12 1557 10/10/12 1159   10/06/12 1000  vancomycin (VANCOCIN) 500 mg in sodium chloride 0.9 % 100 mL IVPB  Status:  Discontinued     500 mg 100 mL/hr over 60 Minutes Intravenous Every 12 hours 10/06/12 0916 10/09/12 0836   10/05/12 1600  vancomycin (VANCOCIN) IVPB 750 mg/150 ml premix  Status:  Discontinued     750 mg 150 mL/hr over 60 Minutes Intravenous Every 24 hours 10/05/12 1557 10/06/12 0916   10/05/12 1330  levofloxacin (LEVAQUIN) IVPB 750 mg     750 mg 100 mL/hr over 90 Minutes Intravenous  Once 10/05/12 1321  10/05/12 1502      Assessment: 81 yoF on D#7 antibiotics for probable PNA and sepsis.    4/18 >> Doxy PTA 4/22 >> Levaquin >> 4/22 >> Vanc >>4/26  Currently afebrile with WBC WNL.  SCr stable.  No growth on blood and urine cultures.    Plan:  1.  Continue levaquin 750mg  PO q 24 hours ( due to CrCl between 30-85ml/min).   2.  F/u length of therapy of antibiotics, clinical course, T, WBC, renal fxn.   Barnabas Henriques E 10/11/2012,10:12 AM

## 2012-10-11 NOTE — Progress Notes (Signed)
Physical Therapy Treatment Patient Details Name: [name] MRN: [id number] DOB: [birth year] Today's Date: 10/11/2012 Time: 4098-1191 PT Time Calculation (min): 20 min  PT Assessment / Plan / Recommendation Comments on Treatment Session  Pt feeling better.  Assisted OOB to amb in hallway on 3 lts avg sats 92%.  Demon 1/4 DOE. Pt progressing well and believe she is appropriate to return to ALF.  Will discuss progress with LPT as initial eval rec SNF. Pt has  RW and states her son plans to get an O2 carrier that will fit on her walker.    Follow Up Recommendations  Home health PT (at ALF)     Does the patient have the potential to tolerate intense rehabilitation     Barriers to Discharge        Equipment Recommendations  None recommended by PT (pt and sister state pt has a RW already)    Recommendations for Other Services    Frequency Min 3X/week   Plan Discharge plan remains appropriate    Precautions / Restrictions Precautions Precautions: Fall Precaution Comments: home O2 3 lts Restrictions Weight Bearing Restrictions: No   Pertinent Vitals/Pain No c/o pain    Mobility  Bed Mobility Bed Mobility: Supine to Sit Supine to Sit: 5: Supervision;HOB elevated Details for Bed Mobility Assistance: Supervision for safety/lines Transfers Transfers: Sit to Stand;Stand to Sit Sit to Stand: 4: Min guard;5: Supervision;From bed Stand to Sit: 4: Min guard;5: Supervision;To chair/3-in-1 Details for Transfer Assistance: Vc's for hand placement and to stay inside of walker Ambulation/Gait Ambulation/Gait Assistance: 4: Min guard Ambulation Distance (Feet): 220 Feet Assistive device: Rolling walker Ambulation/Gait Assistance Details: increased time and 25% VC's on proper walker to self distance and safety with turns. Gait Pattern: Step-through pattern;Decreased stride length     PT Goals                                                 Progressing well    Visit Information  Last  PT Received On: 10/11/12 Assistance Needed: +1    Subjective Data      Cognition    good   Balance   fair + with RW  End of Session PT - End of Session Equipment Utilized During Treatment: Gait belt;Oxygen Activity Tolerance: Patient limited by fatigue   Felecia Shelling  PTA [facility]  Acute  Rehab Pager      [phone]   HHPT at ALF only if facility is able/agreeable to provide current level of care and increased supervision. If facility is unable, then pt will need ST rehab.  Rebeca Alert, MPT Pager: [phone]

## 2012-10-11 NOTE — Progress Notes (Signed)
CSW received call from Dr. [name], that patient is requesting to return to [facility] ALF rather than go to SNF. CSW confirmed with [facility] (who came out to assess patient) that patient would be ok to return. Per PT note today, patient is Supervision-Min Guard ambulating 220 ft with a rolling walker, which [employer] would most likely not cover for SNF stay anyway. CSW will follow-up & facilitate discharge back to ALF tomorrow if stable.   Unice Bailey, LCSW [facility] Clinical Social Worker cell #: [phone]

## 2012-10-12 ENCOUNTER — Inpatient Hospital Stay (HOSPITAL_COMMUNITY): Payer: Medicare Other

## 2012-10-12 DIAGNOSIS — J9610 Chronic respiratory failure, unspecified whether with hypoxia or hypercapnia: Secondary | ICD-10-CM

## 2012-10-12 LAB — CBC
MCH: 26.5 pg (ref 26.0–34.0)
Platelets: 232 10*3/uL (ref 150–400)
RBC: 3.21 MIL/uL — ABNORMAL LOW (ref 3.87–5.11)

## 2012-10-12 LAB — BASIC METABOLIC PANEL
Calcium: 8.5 mg/dL (ref 8.4–10.5)
GFR calc Af Amer: 55 mL/min — ABNORMAL LOW (ref >90)
GFR calc non Af Amer: 48 mL/min — ABNORMAL LOW (ref >90)
Glucose, Bld: 84 mg/dL (ref 70–99)
Sodium: 142 mEq/L (ref 135–145)

## 2012-10-12 NOTE — Progress Notes (Signed)
Received order for rw.  Patient already has rw at home.  No DME needs at this time.

## 2012-10-12 NOTE — Progress Notes (Signed)
CSW spoke with patient & family at bedside re: discharge planning. Patient will be returning to [facility] ALF when ready. CSW left message for [facility] making her aware that hospice will need to be arranged when patient returns. Anticipating discharge tomorrow.   Unice Bailey, LCSW [facility] Clinical Social Worker cell #: [phone]

## 2012-10-12 NOTE — Progress Notes (Signed)
PMT consult for hospice care at ALF [facility].   [facility] does service this ALF - advised Steward Drone CM and [facility] for [facility] -when choice offered, if [facility] chosen, we do service that facility.    Please call [phone] with any questions, you may ask for Lonie Peak - Director of Admissions Or Chalmers Cater, RN Liaison.

## 2012-10-12 NOTE — Progress Notes (Signed)
Physical Therapy Treatment Patient Details Name: [name] MRN: [id number] DOB: 11-04-1930 Today's Date: 10/12/2012 Time: 1914-7829 PT Time Calculation (min): 12 min  PT Assessment / Plan / Recommendation Comments on Treatment Session  Pt progressing well with mobility, she ambulated 400' with RW and 3L O2. Pt safe and steady with ambulation. At current functional level she is appropriate to return to ALF.     Follow Up Recommendations  Home health PT (at ALF)     Does the patient have the potential to tolerate intense rehabilitation     Barriers to Discharge        Equipment Recommendations  None recommended by PT (pt and sister state pt has a RW already)    Recommendations for Other Services    Frequency Min 3X/week   Plan Discharge plan remains appropriate;Frequency remains appropriate    Precautions / Restrictions Precautions Precautions: Fall Precaution Comments: home O2 3L Restrictions Weight Bearing Restrictions: No   Pertinent Vitals/Pain **pt denies pain*    Mobility  Bed Mobility Bed Mobility: Supine to Sit Supine to Sit: HOB elevated;6: Modified independent (Device/Increase time);With rails Transfers Transfers: Sit to Stand;Stand to Sit Sit to Stand: 5: Supervision;From bed;With armrests;With upper extremity assist Stand to Sit: 5: Supervision;To chair/3-in-1;With upper extremity assist;With armrests Details for Transfer Assistance: Vc's for hand placement  Ambulation/Gait Ambulation/Gait Assistance: 5: Supervision Ambulation Distance (Feet): 400 Feet Assistive device: Rolling walker Ambulation/Gait Assistance Details: Pt ambulated with 3L O2. No LOB, good technique with use of RW.  Gait Pattern: Within Functional Limits    Exercises     PT Diagnosis:    PT Problem List:   PT Treatment Interventions:     PT Goals Acute Rehab PT Goals PT Goal Formulation: With patient Time For Goal Achievement: 10/21/12 Potential to Achieve Goals: Good Pt  will go Supine/Side to Sit: with modified independence PT Goal: Supine/Side to Sit - Progress: Met Pt will go Sit to Supine/Side: with modified independence Pt will go Sit to Stand: with supervision PT Goal: Sit to Stand - Progress: Met Pt will Ambulate: 51 - 150 feet;with supervision;with least restrictive assistive device PT Goal: Ambulate - Progress: Met  Visit Information  Last PT Received On: 10/12/12 Assistance Needed: +1    Subjective Data  Subjective: I think I can go back to assisted living, I don't need to go to rehab.  Patient Stated Goal: Get better. Back to ALF   Cognition  Cognition Arousal/Alertness: Awake/alert Behavior During Therapy: Anxious Overall Cognitive Status: No family/caregiver present to determine baseline cognitive functioning    Balance     End of Session PT - End of Session Equipment Utilized During Treatment: Gait belt;Oxygen Activity Tolerance: Patient tolerated treatment well Patient left: in chair;with call bell/phone within reach Nurse Communication: Mobility status   GP     [name] 10/12/2012, 2:04 PM [phone]

## 2012-10-12 NOTE — Progress Notes (Signed)
Attempted to see pt x2 today.  Pt at Xray and then eating lunch.  Will attempt to see as schedule allows. Tory Emerald, [place] [phone]

## 2012-10-12 NOTE — Progress Notes (Signed)
[facility] PROGRESS NOTE  [name] ZOX:[id number] DOB: 01/16/1931 DOA: 10/05/2012 PCP: [name], MD  Assessment/Plan: . Hypotension -Unclear etiology, possibly secondary to infection/pneumonia given the patient was hypothermic as well, but Lactate is normal  -Improved with IV fluids and empiric antibiotics- Vanco and Levaquin.  - CT angiogram of the abdomen with a type II endoleak. Per vascular surgery continue observation as 90% the spontaneously occlude. Follow. Patient's blood pressure is stable with resumption of patient's losartan and discontinuation of IV fluids.  -random cortisol level normal. U. A- and urine cultures so far no growth . Probable pneumonia  withSepsis Syndrome  -As discussed above, sputum with clear mucus. Blood cultures pending with no growth to date. Continue oral levaquin to complete a ten-day course.. Continue tessalon perles. Acute encephalopathy Likely secondary to steroid psychosis versus sundowning. Clinical improvement after decrease of Solu-Medrol and resumption of patient's home dose Xanax.  Patient with no neurological deficits. Patient back to baseline. Follow.  . ARF (acute renal failure)  -likely pre-renal. Improving with IVF. Losartan has been resumed monitor renal function. .Anemia  -Hb 9.6>> 7.7 >> 8.7 >> 8.8 >> 9.0 >>8.5>>9>>8.5  this a.m., with hydration -Follow and transfuse as above, CTA with a type II endovascular leak. Follow.  Hypokalemia -Repleted.  . Acute mild COPD (chronic obstructive pulmonary disease), Gold D  -has chronic SOB-with some mild wheezing -Continue on scheduled nebulized bronchodilators. - IV Solu-Medrol change to oral Medrol per pulmonary. Continue MSIR. Continue Xanax. Chest x-ray is negative. Esophagram with no strictures. Continue Levaquin to complete a ten-day course. Pulmonary following and appreciate input and recommendations.  . Anxiety  - Continue Xanax to 1 mg by mouth 3 times a day which was her  home dose.  [name] HYPERTENSION  - Continue losartan. Continue to hold Lasix.  . ABDOMINAL AORTIC ANEURYSM STATUS post repair/type II endoleak -Has a h/o of endo-vascular AAA repair -CT angiogram of abdomen with a type II endoleak. Patient's blood pressure is now stable although she was hypotensive on admission. Hemoglobin is 8.5 from 9 from 8.8 from 8.7. Patient has been seen  by vascular surgery and recommend observation as 90% of the spontaneously occlude. Per vascular surgery no evidence of rupture or acute intra-abdominal pathology. Patient will need to followup with Vascular surgery as outpatient.    [name] GERD (gastroesophageal reflux disease)  -likely the cause for epigastric pain  -continue on PPI  -prn Antacids  Constipation Per abdominal xray. Continue miralax BID and senokot and follow. Abdominal Pain ??etiology. Clinical improvement. Maybe secondary to GERD vs constipation vs aneurysmal. CT angio abdomen with a type II endoleak.  Follow. Supportive treatment.     Code Status:DNR Family Communication: No family present Disposition Plan: Back to assisted living facility hopefully tomorrow.   Consultants:  Vascular surgery: Dr. Imogene Burn  4/ 24/ 2014  Pulmonary: Dr. Delford Field 10/11/2012  Procedures:  CTA  abdomen 10/07/12  Esophagram 10/12/2012  Chest x-ray 10/12/2012    Antibiotics:  IV Vanc 10/05/12 >>>10/09/12  IV Levaquin 10/05/2012  HPI/Subjective: Patient states epigastric pain improving. Patient alert and oriented. Not confused this morning. Patient states breathing is improved after being started on morphine for her COPD.  Objective: Filed Vitals:   10/11/12 1939 10/11/12 2119 10/12/12 0452 10/12/12 0931  BP:  117/58 132/71   Pulse:  85 74   Temp:  98.7 F (37.1 C) 97 F (36.1 C)   TempSrc:  Oral Oral   Resp:  20 18   Height:  Weight:      SpO2: 97% 99% 99% 97%    Intake/Output Summary (Last 24 hours) at 10/12/12 1256 Last data filed at 10/12/12  0815  Gross per 24 hour  Intake    600 ml  Output   1050 ml  Net   -450 ml   Filed Weights   10/07/12 0349 10/10/12 0500 10/11/12 0500  Weight: 60 kg (132 lb 4.4 oz) 62.143 kg (137 lb) 61.871 kg (136 lb 6.4 oz)    Exam:   General:  Elderly female, alert and appropriate in no apparent distress. Nontoxic appearing  Cardiovascular: Regular rate and rhythm, normal S1-S2  Respiratory: mild expiratory wheezes diffusely  Abdomen: Soft, bowel sounds present, NTTP, nondistended, no guarding and no masses palpable.  Extremities: No cyanosis and no edema  Data Reviewed: Basic Metabolic Panel:  Recent Labs Lab 10/08/12 0340 10/09/12 0404 10/10/12 0443 10/11/12 0450 10/12/12 0527  NA 137 141 140 139 142  K 4.1 4.2 3.3* 4.4 4.0  CL 104 107 106 105 105  CO2 27 28 30 31  33*  GLUCOSE 139* 106* 87 138* 84  BUN 12 16 17 15 15   CREATININE 1.01 1.04 1.08 0.96 1.08  CALCIUM 8.8 8.8 8.6 8.8 8.5   Liver Function Tests:  Recent Labs Lab 10/06/12 0215  AST 15  ALT 6  ALKPHOS 64  BILITOT 0.1*  PROT 4.8*  ALBUMIN 2.1*   No results found for this basename: LIPASE, AMYLASE,  in the last 168 hours No results found for this basename: AMMONIA,  in the last 168 hours CBC:  Recent Labs Lab 10/08/12 1400 10/09/12 0404 10/10/12 0443 10/11/12 0450 10/12/12 0527  WBC 5.0 4.9 5.6 4.4 5.5  HGB 9.7* 9.0* 8.5* 9.0* 8.5*  HCT 29.9* 28.3* 27.3* 28.2* 26.9*  MCV 83.1 83.2 83.5 83.2 83.8  PLT 262 234 230 220 232   Cardiac Enzymes:  Recent Labs Lab 10/05/12 1955 10/06/12 0215  TROPONINI <0.30 <0.30   BNP (last 3 results)  Recent Labs  05/21/12 0506 07/03/12 0630  PROBNP 383.1 674.3*   CBG:  Recent Labs Lab 10/09/12 1612  GLUCAP 119*    Recent Results (from the past 240 hour(s))  CULTURE, BLOOD (ROUTINE X 2)     Status: None   Collection Time    10/05/12 10:30 AM      Result Value Range Status   Specimen Description BLOOD RIGHT ANTECUBITAL   Final   Special  Requests BOTTLES DRAWN AEROBIC AND ANAEROBIC   Final   Culture  Setup Time 10/05/2012 13:59   Final   Culture NO GROWTH 5 DAYS   Final   Report Status 10/11/2012 FINAL   Final  CULTURE, BLOOD (ROUTINE X 2)     Status: None   Collection Time    10/05/12 10:35 AM      Result Value Range Status   Specimen Description BLOOD RIGHT FOREARM   Final   Special Requests BOTTLES DRAWN AEROBIC ONLY   Final   Culture  Setup Time 10/05/2012 13:59   Final   Culture NO GROWTH 5 DAYS   Final   Report Status 10/11/2012 FINAL   Final  URINE CULTURE     Status: None   Collection Time    10/05/12 11:02 AM      Result Value Range Status   Specimen Description URINE, CATHETERIZED   Final   Special Requests NONE   Final   Culture  Setup Time 10/05/2012 14:35  Final   Colony Count NO GROWTH   Final   Culture NO GROWTH   Final   Report Status 10/06/2012 FINAL   Final  MRSA PCR SCREENING     Status: None   Collection Time    10/05/12  3:32 PM      Result Value Range Status   MRSA by PCR NEGATIVE  NEGATIVE Final   Comment:            The GeneXpert MRSA Assay (FDA     approved for NASAL specimens     only), is one component of a     comprehensive MRSA colonization     surveillance program. It is not     intended to diagnose MRSA     infection nor to guide or     monitor treatment for     MRSA infections.     Studies: Dg Chest 2 View  10/12/2012  *RADIOLOGY REPORT*  Clinical Data: Follow up pneumonia.  Cough and left axillary pain.  CHEST - 2 VIEW  Comparison: 10/05/2012  Findings: Mild hyperinflation.  Moderate osteopenia.  Left shoulder arthroplasty.  Patient rotated minimally right, with convex right thoracic spine curvature. Normal heart size and mediastinal contours for age.  Small bilateral pleural effusions, felt to be slightly increased.  Resolution of previously described mild interstitial edema.  Patchy bibasilar atelectasis, similar.  IMPRESSION: Resolution of interstitial edema.   Small bilateral pleural effusions, felt to be slightly increased.   Original Report Authenticated By: Jeronimo Greaves, M.D.    Dg Esophagus  10/12/2012  *RADIOLOGY REPORT*  Clinical Data:Chest pain.  ESOPHAGUS/BARIUM SWALLOW/TABLET STUDY  Fluoroscopy Time: 52 seconds.  Comparison: None.  Findings: Present examination is limited in that the patient was only able to ingest a small amount of barium and be placed in a right oblique position.  The patient was not able to stand, lie on side or on her back.  Presbyesophagus.  No esophageal obstructing lesion.  No reflux demonstrated.  No obvious esophageal mucosa abnormality although evaluation is limited.  IMPRESSION: Presbyesophagus.  Please see above.   Original Report Authenticated By: Lacy Duverney, M.D.     Scheduled Meds: . albuterol  2.5 mg Nebulization Q6H  . ALPRAZolam  1 mg Oral TID  . antiseptic oral rinse  15 mL Mouth Rinse q12n4p  . benzonatate  200 mg Oral TID  . budesonide  0.25 mg Nebulization BID  . chlorhexidine  15 mL Mouth Rinse BID  . enoxaparin (LOVENOX) injection  40 mg Subcutaneous Q24H  . feeding supplement  1 Container Oral Q24H  . fluticasone  1 spray Each Nare Daily  . ipratropium  0.5 mg Nebulization Q6H  . [START ON 10/13/2012] levofloxacin  500 mg Oral Q48H  . losartan  50 mg Oral Daily  . methylPREDNISolone  24 mg Oral Q breakfast  . morphine  15 mg Oral TID  . multivitamin with minerals  1 tablet Oral Daily  . pantoprazole  40 mg Oral BID AC  . polyethylene glycol  17 g Oral BID  . senna-docusate  1 tablet Oral QHS  . sodium chloride  3 mL Intravenous Q12H   Continuous Infusions: . sodium chloride 10 mL/hr (10/07/12 0948)    Principal Problem:   Hypotension Active Problems:   HYPERTENSION   ABDOMINAL AORTIC ANEURYSM   COPD (chronic obstructive pulmonary disease), Gold D    GERD (gastroesophageal reflux disease)   HTN (hypertension)   Chronic respiratory failure   Anxiety  ARF (acute renal failure)   CAP  (community acquired pneumonia)   Unspecified constipation   Endoleak of aortic graft   Acute encephalopathy   Hypokalemia    Time spent: >    [employer] [facility] [employer]  [facility] Pager [phone]. If 7PM-7AM, please contact night-coverage at [email or web address], password [facility] 10/12/2012, 12:56 PM  LOS: 7 days

## 2012-10-12 NOTE — Consult Note (Signed)
PULMONARY  / CRITICAL CARE MEDICINE  Name: WHITNI PASQUINI MRN: [id number] DOB: July 17, 1930    ADMISSION DATE:  10/05/2012 CONSULTATION DATE:  4/28  REFERRING MD :  [name]  PRIMARY SERVICE:  [facility]   CHIEF COMPLAINT:  Dyspnea   BRIEF PATIENT DESCRIPTION:  This is a 79 yof w/ Chronic resp failure in setting of GOLD D COPD. Admitted on 4/22 for CAP (NOS) and AECOPD. PCCM asked to see on 4/28 for wheezing and persistent dyspnea.   SIG EVENTS / STUDIES:     CULTURES:   ANTIBIOTICS: vanc 4/22 >>>4/28 levaquin 4/22>>    SUBJECTIVE:  Feels better. NAD @ rest. VITAL SIGNS: Temp:  [97 F (36.1 C)-98.7 F (37.1 C)] 97 F (36.1 C) (04/29 0452) Pulse Rate:  [74-85] 74 (04/29 0452) Resp:  [18-20] 18 (04/29 0452) BP: (117-154)/(58-74) 132/71 mmHg (04/29 0452) SpO2:  [97 %-99 %] 97 % (04/29 0931) 3 liters  PHYSICAL EXAMINATION: General: no acute distress.  Neuro:  Awake, alert no focal def  HEENT:  Loud upper airway wheeze  Cardiovascular: rrr Lungs:  Distant bs   Abdomen:  Non-tender Musculoskeletal:  Intact  Skin:  Intact    Recent Labs Lab 10/10/12 0443 10/11/12 0450 10/12/12 0527  NA 140 139 142  K 3.3* 4.4 4.0  CL 106 105 105  CO2 30 31 33*  BUN 17 15 15   CREATININE 1.08 0.96 1.08  GLUCOSE 87 138* 84    Recent Labs Lab 10/10/12 0443 10/11/12 0450 10/12/12 0527  HGB 8.5* 9.0* 8.5*  HCT 27.3* 28.2* 26.9*  WBC 5.6 4.4 5.5  PLT 230 220 232   No results found.  ASSESSMENT / PLAN: COPD (gold D) AECOPD,  LLL PNA (NOS): slowly clinically improving. She Laughter has marked Upper-airway noises which are audible over entire chest.  Pleuritic CP Chronic Respiratory failure GERD: Birge c/o reflux and heart burn after large bolus of food intake. >>presbyesophagus on bar swallow Anxiety: a major contributing factor  ARF (improved Jenna Luo w/ IVFs).  Lab Results  Component Value Date   CREATININE 1.08 10/12/2012   CREATININE 0.96 10/11/2012   CREATININE 1.08  10/10/2012    Acute encephalopathy: felt to be exacerbated by high dose steroids (improved 4-29) Anemia: hgb drift reflecting hemoconcentration Type II AAA endoleak: vasc following.   This is a pleasant 79 year old female w/ chronic resp failure in setting of GOLD D COPD. She was admitted for PNA and AECOPD. Has responded well symptomatically but Espe has some Left chest wall pain w/ cough and audible wheeze. This wheeze is in the face of continued on-going intermittent GI distress. Think that her epigastric symptoms and reflux are causing significant upper airway irritation on top of already limited lower airway obstructive disease.   Recommendation Cont levaquin to 10d total rx Taper steroids Cont BDs Cont morphine Cont  PPI to BID.  Home on oxygen  Hospice consult in assisted living center in [place] called paliative care to facilittate Global: Her upper airway dysfunction + anxiety is a major magnifier of her airway distress.  Shan Levans MD Beeper  [phone]  Cell  [phone]  If no response or cell goes to voicemail, call beeper [phone]  10/12/2012, 10:04 AM

## 2012-10-13 ENCOUNTER — Telehealth: Payer: Self-pay | Admitting: Family Medicine

## 2012-10-13 LAB — BASIC METABOLIC PANEL
CO2: 32 mEq/L (ref 19–32)
Calcium: 8.4 mg/dL (ref 8.4–10.5)
GFR calc non Af Amer: 55 mL/min — ABNORMAL LOW (ref >90)
Sodium: 144 mEq/L (ref 135–145)

## 2012-10-13 LAB — CBC
Platelets: 251 10*3/uL (ref 150–400)
RBC: 3.29 MIL/uL — ABNORMAL LOW (ref 3.87–5.11)
WBC: 6.6 10*3/uL (ref 4.0–10.5)

## 2012-10-13 MED ORDER — ALBUTEROL SULFATE (5 MG/ML) 0.5% IN NEBU: 2.5000 mg | INHALATION_SOLUTION | RESPIRATORY_TRACT | Status: DC | PRN

## 2012-10-13 MED ORDER — IPRATROPIUM BROMIDE 0.02 % IN SOLN: 0.5000 mg | Freq: Four times a day (QID) | RESPIRATORY_TRACT | Status: DC

## 2012-10-13 MED ORDER — ALPRAZOLAM 1 MG PO TABS: ORAL_TABLET | ORAL | Status: DC

## 2012-10-13 MED ORDER — HYDROCOD POLST-CHLORPHEN POLST 10-8 MG/5ML PO LQCR: 5.0000 mL | Freq: Two times a day (BID) | ORAL | Status: DC | PRN

## 2012-10-13 MED ORDER — POLYETHYLENE GLYCOL 3350 17 G PO PACK: 17.0000 g | PACK | Freq: Two times a day (BID) | ORAL | Status: DC

## 2012-10-13 MED ORDER — ADULT MULTIVITAMIN W/MINERALS CH: 1.0000 | ORAL_TABLET | Freq: Every day | ORAL | Status: DC

## 2012-10-13 MED ORDER — BENZONATATE 200 MG PO CAPS: 200.0000 mg | ORAL_CAPSULE | Freq: Three times a day (TID) | ORAL | Status: DC

## 2012-10-13 MED ORDER — BUDESONIDE 0.25 MG/2ML IN SUSP: 0.2500 mg | Freq: Two times a day (BID) | RESPIRATORY_TRACT | Status: DC

## 2012-10-13 MED ORDER — OMEPRAZOLE 20 MG PO CPDR: 20.0000 mg | DELAYED_RELEASE_CAPSULE | Freq: Two times a day (BID) | ORAL | Status: DC

## 2012-10-13 MED ORDER — MORPHINE SULFATE 15 MG PO TABS: 15.0000 mg | ORAL_TABLET | Freq: Three times a day (TID) | ORAL | Status: DC

## 2012-10-13 MED ORDER — METHYLPREDNISOLONE 4 MG PO TABS: ORAL_TABLET | ORAL | Status: DC

## 2012-10-13 MED ORDER — ENSURE PUDDING PO PUDG: 1.0000 | ORAL | Status: DC

## 2012-10-13 NOTE — Discharge Summary (Signed)
Physician Discharge Summary  [name] ZOX:[id number] DOB: Oct 12, 1930 DOA: 10/05/2012  PCP: [name], MD  Admit date: 10/05/2012 Discharge date: 10/13/2012  Time spent: Greater than 30 minutes minutes  Recommendations for Outpatient Follow-up:  1. Dr. [name], PCP in 5 days. 2. [facility]-followup at assisted living facility. 3. Outpatient followup with Dr. Hart Rochester, vascular surgery-as deemed necessary. 4. Consider repeat CT chest for followup of RML opacity, as deemed necessary 5. OP follow up with Dr. Shan Levans, Pulmonology 6. OP follow up with Dr. Hart Rochester, Vascular surgery  Discharge Diagnoses:  Principal Problem:   Hypotension Active Problems:   HYPERTENSION   ABDOMINAL AORTIC ANEURYSM   COPD (chronic obstructive pulmonary disease), Gold D    GERD (gastroesophageal reflux disease)   HTN (hypertension)   Chronic respiratory failure   Anxiety   ARF (acute renal failure)   CAP (community acquired pneumonia)   Unspecified constipation   Endoleak of aortic graft   Acute encephalopathy   Hypokalemia   Discharge Condition: Improved & Stable  Diet recommendation: Regular diet  Filed Weights   10/10/12 0500 10/11/12 0500 10/13/12 0500  Weight: 62.143 kg (137 lb) 61.871 kg (136 lb 6.4 oz) 61.871 kg (136 lb 6.4 oz)    History of present illness:  79 year old female with history of chronic respiratory failure on 3 L per minute oxygen, secondary to COPD, AAA status post repair, HTN, GERD, anxiety was admitted on 10/05/12 with worsening nonproductive cough, dyspnea. She failed outpatient doxycycline. In the ED, patient was hypo-tensive, mildly hypothermic. Hospitalist admission was requested.  Hospital Course:  1. Hypotension: Etiology unclear. May have been secondary to infection/pneumonia or dehydration. Resolved after hydration. Patient has been resumed on her losartan. 2. Probable pneumonia/sepsis syndrome: Blood cultures are negative to date. She  was empirically started on IV vancomycin and Levaquin. Clinically improved. Patient has been on Levaquin q. 48 hours adjusted to renal functions. She has received her last dose of Levaquin which will complete total 10 days of treatment. 3. Acute encephalopathy: Possibly secondary to steroids. Resolved. 4. Acute renal failure: Possibly secondary to intravascular volume depletion in the context of ARB. Resolved after IV fluids. Losartan has been resumed. 5. Anemia: Likely chronic disease. Stable. 6. Hypokalemia: Repleted. 7. COPD exacerbation: Pulmonology was consulted. She has clinically improved. Pulmonary recommends 10 days Levaquin (completed), steroid taper, bronchodilators, morphine, twice a day PPI, home oxygen. They have arranged for hospice consultation at assisted living facility. She likely has upper airway dysfunction and anxiety which contribute to her dyspnea. Chest x-ray negative. Esophagogram does not show strictures. 8. Anxiety: Continue scheduled Xanax. 9. Hypertension: Continue losartan. Lasix on hold secondary to recent acute renal failure. 10. Abdominal aortic aneurysm status post repair/type II endoleak: History of endovascular AAA repair. CTA of the abdomen showed type II endoleak. Hemoglobin stable. Patient was seen by vascular surgery who recommended observation is 90% of these spontaneously occlude. Per vascular surgery, no evidence of rupture or acute intra-abdominal pathology. She will need outpatient followup with vascular surgery, although she is a very poor candidate for any surgical intervention. 11. GERD: PPI increased to twice a day. 12. Constipation: Continue bowel regimen. 13. Abdominal pain: DD-GERD, constipation, AAA leak: Resolved. Continue symptomatic treatment. 14. DO NOT RESUSCITATE  Consultants:  Vascular surgery: Dr. Imogene Burn 4/ 24/ 2014  Pulmonary: Dr. Delford Field 10/11/2012  Procedures:  CTA abdomen 10/07/12  Esophagram 10/12/2012  Chest x-ray  10/12/2012   Discharge Exam:  Complaints: Patient denies complaints. She denies dyspnea or pain  anywhere.  Filed Vitals:   10/13/12 0500 10/13/12 0509 10/13/12 0841 10/13/12 0917  BP:  125/58  123/63  Pulse:  94  100  Temp:  97.5 F (36.4 C)    TempSrc:  Oral    Resp:  18    Height:      Weight: 61.871 kg (136 lb 6.4 oz)     SpO2:  95% 96%      General exam: Comfortable. Sitting comfortably on the chair.  Respiratory system: Fair breath sounds bilaterally. Scattered bilateral few expiratory rhonchi but no crackles. No increased work of breathing. Able to speak in full sentences.  Cardiovascular system: S1 and S2 heard, RRR. No JVD, murmurs or pedal edema. Telemetry shows sinus rhythm.  Gastrointestinal system: Abdomen is nondistended, soft and nontender. Normal bowel sounds heard.  Central nervous system: Alert and oriented. No focal neurological deficits.  Extremities: Symmetric 5 x 5 power.  Discharge Instructions      Discharge Orders   Future Appointments Provider Department Dept Phone   11/10/2012 4:30 PM Storm Frisk, MD [facility] [phone]   03/01/2013 11:45 AM [name], MD [facility] [phone]   Future Orders Complete By Expires     Call MD for:  difficulty breathing, headache or visual disturbances  As directed     Call MD for:  severe uncontrolled pain  As directed     Diet general  As directed     Discharge instructions  As directed     Comments:      Continue oxygen via nasal cannula at 3 L per minute continuously.    Increase activity slowly  As directed         Medication List    STOP taking these medications       doxycycline 100 MG tablet  Commonly known as:  VIBRA-TABS     furosemide 20 MG tablet  Commonly known as:  LASIX     neomycin-bacitracin-polymyxin Oint  Commonly known as:  NEOSPORIN     potassium chloride 10 MEQ CR capsule  Commonly known as:  MICRO-K      TAKE  these medications       acetaminophen 325 MG tablet  Commonly known as:  TYLENOL  Take 2 tablets (650 mg total) by mouth every 6 (six) hours as needed (or Fever >/= 101).     AEROCHAMBER MV inhaler  Use as instructed     albuterol (2.5 MG/3ML) 0.083% nebulizer solution  Commonly known as:  PROVENTIL  Take 2.5 mg by nebulization 4 (four) times daily.     albuterol (5 MG/ML) 0.5% nebulizer solution  Commonly known as:  PROVENTIL  Take 0.5 mLs (2.5 mg total) by nebulization every 2 (two) hours as needed for wheezing or shortness of breath.     ALPRAZolam 1 MG tablet  Commonly known as:  XANAX  Take 1 tablet three times a day scheduled 8am, 2pm, and 9pm for anxiety     antiseptic oral rinse Liqd  15 mLs by Mouth Rinse route as needed.     atorvastatin 40 MG tablet  Commonly known as:  LIPITOR  Take 40 mg by mouth daily.     benzonatate 200 MG capsule  Commonly known as:  TESSALON  Take 1 capsule (200 mg total) by mouth 3 (three) times daily.     budesonide 0.25 MG/2ML nebulizer solution  Commonly known as:  PULMICORT  Take 2 mLs (0.25 mg total) by nebulization  2 (two) times daily.     chlorpheniramine-HYDROcodone 10-8 MG/5ML Lqcr  Commonly known as:  TUSSIONEX  Take 5 mLs by mouth every 12 (twelve) hours as needed (cough.).     DSS 100 MG Caps  Take 100 mg by mouth 2 (two) times daily.     feeding supplement Pudg  Take 1 Container by mouth daily.     fluticasone 50 MCG/ACT nasal spray  Commonly known as:  FLONASE  Place 1 spray into the nose daily.     ipratropium 0.02 % nebulizer solution  Commonly known as:  ATROVENT  Take 2.5 mLs (0.5 mg total) by nebulization every 6 (six) hours.     losartan 50 MG tablet  Commonly known as:  COZAAR  Take 50 mg by mouth daily. Hold for sbp <  Or = 100     magic mouthwash w/lidocaine Soln  Take 5 mLs by mouth 4 (four) times daily as needed (pain).     methylPREDNISolone 4 MG tablet  Commonly known as:  MEDROL  1 disp  pack, 21 each. Please follow pkg instructions. Please provide 1 pack.     morphine 15 MG tablet  Commonly known as:  MSIR  Take 1 tablet (15 mg total) by mouth 3 (three) times daily.     multivitamin with minerals Tabs  Take 1 tablet by mouth daily.     omeprazole 20 MG capsule  Commonly known as:  PRILOSEC  Take 1 capsule (20 mg total) by mouth 2 (two) times daily before a meal.     polyethylene glycol packet  Commonly known as:  MIRALAX / GLYCOLAX  Take 17 g by mouth 2 (two) times daily.     simethicone 80 MG chewable tablet  Commonly known as:  GAS-X  Chew 1 tablet (80 mg total) by mouth every 4 (four) hours as needed.     sodium chloride 0.65 % Soln nasal spray  Commonly known as:  OCEAN  Place 1 spray into the nose as needed for congestion.       Follow-up Information   Follow up with [name], MD. Schedule an appointment as soon as possible for a visit in 5 days.   Contact information:   [address] [address] [place] [address] [phone]       Follow up with [facility] . (Followup at assisted living facility.)        The results of significant diagnostics from this hospitalization (including imaging, microbiology, ancillary and laboratory) are listed below for reference.    Significant Diagnostic Studies: Dg Chest 2 View  10/12/2012  *RADIOLOGY REPORT*  Clinical Data: Follow up pneumonia.  Cough and left axillary pain.  CHEST - 2 VIEW  Comparison: 10/05/2012  Findings: Mild hyperinflation.  Moderate osteopenia.  Left shoulder arthroplasty.  Patient rotated minimally right, with convex right thoracic spine curvature. Normal heart size and mediastinal contours for age.  Small bilateral pleural effusions, felt to be slightly increased.  Resolution of previously described mild interstitial edema.  Patchy bibasilar atelectasis, similar.  IMPRESSION: Resolution of interstitial edema.  Small bilateral pleural effusions, felt  to be slightly increased.   Original Report Authenticated By: Jeronimo Greaves, M.D.    Ct Angio Pelvis W/cm &/or Wo/cm  10/07/2012  *RADIOLOGY REPORT*  Clinical Data:  Hypotension  CT ANGIOGRAPHY ABDOMEN AND PELVIS  Technique:  Multidetector CT imaging of the abdomen and pelvis was performed using the standard protocol during bolus administration of  intravenous contrast.  Multiplanar reconstructed images including MIPs were obtained and reviewed to evaluate the vascular anatomy.  Contrast: OMNIPAQUE IOHEXOL 350 MG/ML SOLN,  Comparison:  08/21/2009  Findings:  The aortobi-iliac stent graft is again noted.  In endoleak is now present.  Punctate foci of contrast enhancement on arterial phase images within the aneurysm sac can be seen anteriorly on image 49, and right posterolaterally on images 63 and 62.  Delayed phase images demonstrate progressive filling of contrast within the aneurysm sac at both locations.  The stent graft is well opposed along the wall of the aorta at the neck and this is most likely a type 2 endoleak.  The offending vessel was not clearly delineated.  There is no obvious contrast filling at the origin of the IMA.  Maximal AP and transverse aneurysm sac diameters are 4.3 and 4.3 cm respectively which is stable.  There is aneurysmal dilatation of the aorta above the renal arteries with extensive irregular mural plaque and thrombus. Maximal AP and transverse diameters at the diaphragmatic hiatus are 4.5 and 4.6 cm respectively.  This has increased from 3.7 and 3.9 cm on the prior study based on my measurements.  No evidence of retroperitoneal hemorrhage or contrast extravasation beyond the lumen of the aorta to suggest rupture.  There is long segment significant narrowing in the proximal 2 cm of the celiac axis.  Branch vessels are patent.  SMA is widely patent.  Branch vessels are also grossly patent.  There is calcified plaque at the origin of the bilateral renal arteries. Significant  narrowing cannot be excluded.  IMA reconstitutes.  Branches are diminutive and patent.  Aortic and iliac limbs of the graft are patent.  Distal landing zones occur in the distal common iliac arteries bilaterally. Native common iliac arteries are nonaneurysmal.  Right internal and external iliac arteries are mildly tortuous and patent.  Similarly, left internal and external iliac arteries are not aneurysmal patent.  Bilateral small pleural effusions have developed.  Dependent atelectasis coexist at both posterior lung bases.  There is consolidation in the medial basal segment of the left lower lobe adjacent to the descending aorta which may again simply represent volume loss or possibly chronic disease.  And 8 mm triangular opacity has developed at the base of the right middle lobe on image 16.  It is nonspecific and may simply represent pneumonitis or atelectasis.  It may represent an irregular nodule.  Diffuse hepatic steatosis.  Gallbladder, spleen, adrenal glands are within normal limits.  Pancreas is atrophic.  Chronic changes of the kidneys.  Left pelvic seroma is not significantly changed.  Foley catheter decompresses the bladder.  Small amount of free fluid is seen within the dependent portion of the pelvis.  Sigmoid diverticulosis without evidence of acute diverticulitis.  Uterus is absent.  Adnexa are unremarkable.  Chronic changes in the lumbar spine without a vertebral compression deformity.  Levoscoliosis at the thoracolumbar junction.   Review of the MIP images confirms the above findings.  IMPRESSION: The aneurysm sac is stable in size however and endoleak is not visualized.  It is probably a type 2 endoleak.  New small bilateral pleural effusions with bibasilar atelectasis verses airspace disease.  There is a nonspecific 8 mm triangular opacity at the base of the right middle lobe which will require follow-up. If the patient is at high risk for bronchogenic carcinoma, follow-up chest CT at 3-6 months  is recommended.  If the patient is at low risk for bronchogenic  carcinoma, follow-up chest CT at 6-12 months is recommended.  This recommendation follows the consensus statement: Guidelines for Management of Small Pulmonary Nodules Detected on CT Scans: A Statement from the Fleischner Society as published in Radiology 2005; 237:395-400.  Atherosclerotic changes of the visceral vasculature as described.   Original Report Authenticated By: Jolaine Click, M.D.    Ct Angio Abdomen W/cm &/or Wo Contrast  10/07/2012  *RADIOLOGY REPORT*  Clinical Data:  Hypotension  CT ANGIOGRAPHY ABDOMEN AND PELVIS  Technique:  Multidetector CT imaging of the abdomen and pelvis was performed using the standard protocol during bolus administration of intravenous contrast.  Multiplanar reconstructed images including MIPs were obtained and reviewed to evaluate the vascular anatomy.  Contrast: OMNIPAQUE IOHEXOL 350 MG/ML SOLN,  Comparison:  08/21/2009  Findings:  The aortobi-iliac stent graft is again noted.  In endoleak is now present.  Punctate foci of contrast enhancement on arterial phase images within the aneurysm sac can be seen anteriorly on image 49, and right posterolaterally on images 63 and 62.  Delayed phase images demonstrate progressive filling of contrast within the aneurysm sac at both locations.  The stent graft is well opposed along the wall of the aorta at the neck and this is most likely a type 2 endoleak.  The offending vessel was not clearly delineated.  There is no obvious contrast filling at the origin of the IMA.  Maximal AP and transverse aneurysm sac diameters are 4.3 and 4.3 cm respectively which is stable.  There is aneurysmal dilatation of the aorta above the renal arteries with extensive irregular mural plaque and thrombus. Maximal AP and transverse diameters at the diaphragmatic hiatus are 4.5 and 4.6 cm respectively.  This has increased from 3.7 and 3.9 cm on the prior study based on my measurements.   No evidence of retroperitoneal hemorrhage or contrast extravasation beyond the lumen of the aorta to suggest rupture.  There is long segment significant narrowing in the proximal 2 cm of the celiac axis.  Branch vessels are patent.  SMA is widely patent.  Branch vessels are also grossly patent.  There is calcified plaque at the origin of the bilateral renal arteries. Significant narrowing cannot be excluded.  IMA reconstitutes.  Branches are diminutive and patent.  Aortic and iliac limbs of the graft are patent.  Distal landing zones occur in the distal common iliac arteries bilaterally. Native common iliac arteries are nonaneurysmal.  Right internal and external iliac arteries are mildly tortuous and patent.  Similarly, left internal and external iliac arteries are not aneurysmal patent.  Bilateral small pleural effusions have developed.  Dependent atelectasis coexist at both posterior lung bases.  There is consolidation in the medial basal segment of the left lower lobe adjacent to the descending aorta which may again simply represent volume loss or possibly chronic disease.  And 8 mm triangular opacity has developed at the base of the right middle lobe on image 16.  It is nonspecific and may simply represent pneumonitis or atelectasis.  It may represent an irregular nodule.  Diffuse hepatic steatosis.  Gallbladder, spleen, adrenal glands are within normal limits.  Pancreas is atrophic.  Chronic changes of the kidneys.  Left pelvic seroma is not significantly changed.  Foley catheter decompresses the bladder.  Small amount of free fluid is seen within the dependent portion of the pelvis.  Sigmoid diverticulosis without evidence of acute diverticulitis.  Uterus is absent.  Adnexa are unremarkable.  Chronic changes in the lumbar spine without a vertebral  compression deformity.  Levoscoliosis at the thoracolumbar junction.   Review of the MIP images confirms the above findings.  IMPRESSION: The aneurysm sac is stable  in size however and endoleak is not visualized.  It is probably a type 2 endoleak.  New small bilateral pleural effusions with bibasilar atelectasis verses airspace disease.  There is a nonspecific 8 mm triangular opacity at the base of the right middle lobe which will require follow-up. If the patient is at high risk for bronchogenic carcinoma, follow-up chest CT at 3-6 months is recommended.  If the patient is at low risk for bronchogenic carcinoma, follow-up chest CT at 6-12 months is recommended.  This recommendation follows the consensus statement: Guidelines for Management of Small Pulmonary Nodules Detected on CT Scans: A Statement from the Fleischner Society as published in Radiology 2005; 237:395-400.  Atherosclerotic changes of the visceral vasculature as described.   Original Report Authenticated By: Jolaine Click, M.D.    Dg Esophagus  10/12/2012  *RADIOLOGY REPORT*  Clinical Data:Chest pain.  ESOPHAGUS/BARIUM SWALLOW/TABLET STUDY  Fluoroscopy Time: 52 seconds.  Comparison: None.  Findings: Present examination is limited in that the patient was only able to ingest a small amount of barium and be placed in a right oblique position.  The patient was not able to stand, lie on side or on her back.  Presbyesophagus.  No esophageal obstructing lesion.  No reflux demonstrated.  No obvious esophageal mucosa abnormality although evaluation is limited.  IMPRESSION: Presbyesophagus.  Please see above.   Original Report Authenticated By: Lacy Duverney, M.D.    Dg Chest Port 1 View  10/05/2012  *RADIOLOGY REPORT*  Clinical Data: Cough  PORTABLE CHEST - 1 VIEW  Comparison: Prior chest x-ray 08/11/2012  Findings: Slightly increased patchy opacity in the left lung base, likely atelectasis superimposed on chronic changes.  Unchanged cardiac and mediastinal contours.  There is aortic atherosclerosis. Slightly increased pulmonary vascular congestion and diffuse prominence of the interstitial markings. Small right and  probable left pleural effusions.  Left shoulder arthroplasty.  No acute osseous abnormality.  IMPRESSION:  1.  Increased pulmonary vascular congestion with diffuse prominence of interstitial markings favored to reflect mild pulmonary edema. An atypical infection including viral pneumonia could have a similar appearance.  2.  Small right to probable small left pleural effusions.   Original Report Authenticated By: Malachy Moan, M.D.    Dg Abd Portable 2v  10/05/2012  *RADIOLOGY REPORT*  Clinical Data: Epigastric abdominal pain  PORTABLE ABDOMEN - 2 VIEW  Comparison: Chest film of earlier in the day.  Abdominal CT 08/21/2009.  Findings: Supine and right-side up decubitus views.  The supine view demonstrates moderate convex left lumbar spine curvature. Rectal thermistor in place.  Aortic/iliac Endograft.  Moderate stool throughout the colon.  No gaseous distention of small bowel loops.  Right-sided decubitus view demonstrates no evidence of free intraperitoneal air. No significant air fluid levels.  IMPRESSION: No acute findings.  Possible constipation.   Original Report Authenticated By: Jeronimo Greaves, M.D.     Microbiology: Recent Results (from the past 240 hour(s))  CULTURE, BLOOD (ROUTINE X 2)     Status: None   Collection Time    10/05/12 10:30 AM      Result Value Range Status   Specimen Description BLOOD RIGHT ANTECUBITAL   Final   Special Requests BOTTLES DRAWN AEROBIC AND ANAEROBIC   Final   Culture  Setup Time 10/05/2012 13:59   Final   Culture NO GROWTH 5 DAYS  Final   Report Status 10/11/2012 FINAL   Final  CULTURE, BLOOD (ROUTINE X 2)     Status: None   Collection Time    10/05/12 10:35 AM      Result Value Range Status   Specimen Description BLOOD RIGHT FOREARM   Final   Special Requests BOTTLES DRAWN AEROBIC ONLY   Final   Culture  Setup Time 10/05/2012 13:59   Final   Culture NO GROWTH 5 DAYS   Final   Report Status 10/11/2012 FINAL   Final  URINE CULTURE     Status:  None   Collection Time    10/05/12 11:02 AM      Result Value Range Status   Specimen Description URINE, CATHETERIZED   Final   Special Requests NONE   Final   Culture  Setup Time 10/05/2012 14:35   Final   Colony Count NO GROWTH   Final   Culture NO GROWTH   Final   Report Status 10/06/2012 FINAL   Final  MRSA PCR SCREENING     Status: None   Collection Time    10/05/12  3:32 PM      Result Value Range Status   MRSA by PCR NEGATIVE  NEGATIVE Final   Comment:            The GeneXpert MRSA Assay (FDA     approved for NASAL specimens     only), is one component of a     comprehensive MRSA colonization     surveillance program. It is not     intended to diagnose MRSA     infection nor to guide or     monitor treatment for     MRSA infections.     Labs: Basic Metabolic Panel:  Recent Labs Lab 10/09/12 0404 10/10/12 0443 10/11/12 0450 10/12/12 0527 10/13/12 0500  NA 141 140 139 142 144  K 4.2 3.3* 4.4 4.0 3.6  CL 107 106 105 105 107  CO2 28 30 31  33* 32  GLUCOSE 106* 87 138* 84 116*  BUN 16 17 15 15 17   CREATININE 1.04 1.08 0.96 1.08 0.95  CALCIUM 8.8 8.6 8.8 8.5 8.4   Liver Function Tests: No results found for this basename: AST, ALT, ALKPHOS, BILITOT, PROT, ALBUMIN,  in the last 168 hours No results found for this basename: LIPASE, AMYLASE,  in the last 168 hours No results found for this basename: AMMONIA,  in the last 168 hours CBC:  Recent Labs Lab 10/09/12 0404 10/10/12 0443 10/11/12 0450 10/12/12 0527 10/13/12 0500  WBC 4.9 5.6 4.4 5.5 6.6  HGB 9.0* 8.5* 9.0* 8.5* 8.4*  HCT 28.3* 27.3* 28.2* 26.9* 27.6*  MCV 83.2 83.5 83.2 83.8 83.9  PLT 234 230 220 232 251   Cardiac Enzymes: No results found for this basename: CKTOTAL, CKMB, CKMBINDEX, TROPONINI,  in the last 168 hours BNP: BNP (last 3 results)  Recent Labs  05/21/12 0506 07/03/12 0630  PROBNP 383.1 674.3*   CBG:  Recent Labs Lab 10/09/12 1612  GLUCAP 119*    Additional  labs:    Signed:  Elianna Windom  [facility] 10/13/2012, 12:09 PM

## 2012-10-13 NOTE — Progress Notes (Signed)
10/13/12 1402 Reviewed discharge with patient. Patient verbalized understanding of discharge instructions. Copy of discharge and prescriptions given to patient along with a packet for her SNF.

## 2012-10-13 NOTE — Progress Notes (Signed)
Occupational Therapy Treatment Patient Details Name: [name] MRN: [id number] DOB: [birth year] Today's Date: 10/13/2012 Time: 4132-4401 OT Time Calculation (min): 15 min  OT Assessment / Plan / Recommendation Comments on Treatment Session Pt demonstrates improved tolerance of activity and mobility.  Plan is to return to ALF with hospice home health today.      Follow Up Recommendations  Supervision/Assistance - 24 hour (ALF)    Barriers to Discharge       Equipment Recommendations  None recommended by OT    Recommendations for Other Services    Frequency Min 2X/week   Plan Discharge plan remains appropriate    Precautions / Restrictions Precautions Precautions: Fall Precaution Comments: home O2 3L Restrictions Weight Bearing Restrictions: No   Pertinent Vitals/Pain No pain    ADL  Eating/Feeding: Independent Where Assessed - Eating/Feeding: Edge of bed Grooming: Wash/dry hands;Supervision/safety Where Assessed - Grooming: Unsupported standing Toilet Transfer: Radiographer, therapeutic Method: Sit to Barista: Comfort height toilet;Grab bars Toileting - Architect and Hygiene: Supervision/safety Where Assessed - Engineer, mining and Hygiene: Sit to stand from 3-in-1 or toilet Equipment Used: Rolling walker (02) Transfers/Ambulation Related to ADLs: supervision with RW, good technique ADL Comments: Encouraged pacing and purse lip breathing with ambulation and standing activities.    OT Diagnosis:    OT Problem List:   OT Treatment Interventions:     OT Goals ADL Goals Pt Will Perform Grooming: with supervision;Standing at sink ADL Goal: Grooming - Progress: Met Pt Will Perform Upper Body Bathing: with supervision;Sitting, chair Pt Will Perform Lower Body Bathing: with supervision;Sit to stand from chair;Sit to stand from bed Pt Will Perform Upper Body Dressing: with supervision;Sitting, chair;Sitting,  bed Pt Will Perform Lower Body Dressing: with supervision;Sit to stand from chair;Sit to stand from bed Pt Will Transfer to Toilet: with supervision;Ambulation;Comfort height toilet ADL Goal: Toilet Transfer - Progress: Met Pt Will Perform Toileting - Clothing Manipulation: with supervision;Standing ADL Goal: Toileting - Clothing Manipulation - Progress: Met Pt Will Perform Toileting - Hygiene: with supervision;Standing at 3-in-1/toilet ADL Goal: Toileting - Hygiene - Progress: Met Additional ADL Goal #1: Pt will participate in 25 mins therapeutic acitivity to increase endurance needed for BADLs ADL Goal: Additional Goal #1 - Progress: Progressing toward goals  Visit Information  Last OT Received On: 10/13/12 Assistance Needed: +1    Subjective Data      Prior Functioning       Cognition  Cognition Arousal/Alertness: Awake/alert Behavior During Therapy: WFL for tasks assessed/performed Overall Cognitive Status: Within Functional Limits for tasks assessed    Mobility  Bed Mobility Bed Mobility: Supine to Sit Supine to Sit: HOB elevated;6: Modified independent (Device/Increase time);With rails Transfers Transfers: Sit to Stand;Stand to Sit Sit to Stand: 5: Supervision;With upper extremity assist;From bed;From toilet Stand to Sit: 5: Supervision;With upper extremity assist;To toilet;To bed    Exercises      Balance Static Standing Balance Static Standing - Balance Support: No upper extremity supported Static Standing - Level of Assistance: 5: Stand by assistance Static Standing - Comment/# of Minutes: 1   End of Session OT - End of Session Activity Tolerance: Patient tolerated treatment well Patient left: with call bell/phone within reach;with family/visitor present (EOB eating lunch) Nurse Communication:  (Pt should call her son, preparing for d/c.)  GO     Evern Bio 10/13/2012, 12:23 PM [phone]

## 2012-10-13 NOTE — Progress Notes (Signed)
Patient is set to discharge back to [facility]F today. CSW confirmed with Toni Amend @ ALF that patient is ok to return. Patient & son aware, son to transport to facility. Discharge packet given to RN, Dekina.   Amanda Bailey, LCSW [facility] Clinical Social Worker cell #: [phone]

## 2012-10-13 NOTE — Telephone Encounter (Signed)
Verbal ok given to [name] at [facility].

## 2012-10-13 NOTE — Telephone Encounter (Signed)
Ok to give verbal order.

## 2012-10-13 NOTE — Telephone Encounter (Signed)
[name] w/[facility] called in ref to the pt. She says [name] Minor at [facility] referred pt to Hospice, but [name] says Hospice needs verbal authorization from you since you are pt's PCP.  Pt is being released today to [facility].

## 2012-10-13 NOTE — Progress Notes (Signed)
PULMONARY  / CRITICAL CARE MEDICINE  Name: MONNA CREAN MRN: [id number] DOB: 1931-05-04    ADMISSION DATE:  10/05/2012 CONSULTATION DATE:  4/28  REFERRING MD :  [name]  PRIMARY SERVICE:  [facility]   CHIEF COMPLAINT:  Dyspnea   BRIEF PATIENT DESCRIPTION:  This is a 79 yof w/ Chronic resp failure in setting of GOLD D COPD. Admitted on 4/22 for CAP (NOS) and AECOPD. PCCM asked to see on 4/28 for wheezing and persistent dyspnea.   SIG EVENTS / STUDIES:     CULTURES:   ANTIBIOTICS: vanc 4/22 >>>4/28 levaquin 4/22>>    SUBJECTIVE:  Feels better. NAD @ rest. VITAL SIGNS: Temp:  [97.5 F (36.4 C)-98.2 F (36.8 C)] 97.5 F (36.4 C) (04/30 0509) Pulse Rate:  [88-100] 100 (04/30 0917) Resp:  [18-20] 18 (04/30 0509) BP: (116-129)/(58-63) 123/63 mmHg (04/30 0917) SpO2:  [95 %-100 %] 96 % (04/30 0841) Weight:  [61.871 kg (136 lb 6.4 oz)] 61.871 kg (136 lb 6.4 oz) (04/30 0500) 3 liters  PHYSICAL EXAMINATION: General: no acute distress.  Neuro:  Awake, alert no focal def  HEENT:  Loud upper airway wheeze  Cardiovascular: rrr Lungs:  Distant bs   Abdomen:  Non-tender Musculoskeletal:  Intact  Skin:  Intact    Recent Labs Lab 10/11/12 0450 10/12/12 0527 10/13/12 0500  NA 139 142 144  K 4.4 4.0 3.6  CL 105 105 107  CO2 31 33* 32  BUN 15 15 17   CREATININE 0.96 1.08 0.95  GLUCOSE 138* 84 116*    Recent Labs Lab 10/11/12 0450 10/12/12 0527 10/13/12 0500  HGB 9.0* 8.5* 8.4*  HCT 28.2* 26.9* 27.6*  WBC 4.4 5.5 6.6  PLT 220 232 251   Dg Chest 2 View  10/12/2012  *RADIOLOGY REPORT*  Clinical Data: Follow up pneumonia.  Cough and left axillary pain.  CHEST - 2 VIEW  Comparison: 10/05/2012  Findings: Mild hyperinflation.  Moderate osteopenia.  Left shoulder arthroplasty.  Patient rotated minimally right, with convex right thoracic spine curvature. Normal heart size and mediastinal contours for age.  Small bilateral pleural effusions, felt to be slightly increased.   Resolution of previously described mild interstitial edema.  Patchy bibasilar atelectasis, similar.  IMPRESSION: Resolution of interstitial edema.  Small bilateral pleural effusions, felt to be slightly increased.   Original Report Authenticated By: Jeronimo Greaves, M.D.    Dg Esophagus  10/12/2012  *RADIOLOGY REPORT*  Clinical Data:Chest pain.  ESOPHAGUS/BARIUM SWALLOW/TABLET STUDY  Fluoroscopy Time: 52 seconds.  Comparison: None.  Findings: Present examination is limited in that the patient was only able to ingest a small amount of barium and be placed in a right oblique position.  The patient was not able to stand, lie on side or on her back.  Presbyesophagus.  No esophageal obstructing lesion.  No reflux demonstrated.  No obvious esophageal mucosa abnormality although evaluation is limited.  IMPRESSION: Presbyesophagus.  Please see above.   Original Report Authenticated By: Lacy Duverney, M.D.     ASSESSMENT / PLAN: COPD (gold D) AECOPD,  LLL PNA (NOS): slowly clinically improving. She Mctigue has marked Upper-airway noises which are audible over entire chest.  Pleuritic CP Chronic Respiratory failure GERD: Chiao c/o reflux and heart burn after large bolus of food intake. >>presbyesophagus on bar swallow Anxiety: a major contributing factor  ARF (improved Jenna Luo w/ IVFs).  Lab Results  Component Value Date   CREATININE 0.95 10/13/2012   CREATININE 1.08 10/12/2012   CREATININE 0.96 10/11/2012  Acute encephalopathy: felt to be exacerbated by high dose steroids (improved 4-29) Anemia: hgb drift reflecting hemoconcentration Type II AAA endoleak: vasc following.   This is a pleasant 79 year old female w/ chronic resp failure in setting of GOLD D COPD. She was admitted for PNA and AECOPD. Has responded well symptomatically but Daniello has some Left chest wall pain w/ cough and audible wheeze. This wheeze is in the face of continued on-going intermittent GI distress. Think that her epigastric symptoms  and reflux are causing significant upper airway irritation on top of already limited lower airway obstructive disease.   Recommendation Cont levaquin to 10d total rx Taper steroids Cont BDs Cont morphine Cont  PPI to BID.  Home on oxygen  Hospice consult in assisted living center [place] called paliative care to facilittate Global: Her upper airway dysfunction + anxiety is a major magnifier of her airway distress. 4-30 PCCM will be available prn.  Brett Canales Minor ACNP [facility] Pollack PCCM Pager [phone] till 3 pm If no answer page [phone] 10/13/2012, 9:42 AM  [facility] for d/c today Caryl Bis  [phone]  Cell  [phone]  If no response or cell goes to voicemail, call beeper [phone]

## 2012-10-15 ENCOUNTER — Telehealth: Payer: Self-pay | Admitting: Family Medicine

## 2012-10-15 ENCOUNTER — Telehealth: Payer: Self-pay

## 2012-10-15 NOTE — Telephone Encounter (Signed)
Jacki Cones, please look into this.

## 2012-10-15 NOTE — Telephone Encounter (Signed)
Order faxed. Left message on voice mail advising nurse order has been faxed.

## 2012-10-15 NOTE — Telephone Encounter (Signed)
Please write order and I will fax.

## 2012-10-15 NOTE — Telephone Encounter (Signed)
[name] with [employer] left v/m; Toni Amend has faxed copy of morphine and xanax prescription that were electronically signed at [facility] and pharmacy will not accept electronic signatures; [name] needs to clarify prescriptions; Morphine 15 mg tablets with instructions take one tablet by mouth three times a day. Xanax 1 mg with instructions take one tablet three times a day scheduled at 8 AM, 2 PM and 9 PM for anxiety. [name] request call back.

## 2012-10-15 NOTE — Telephone Encounter (Signed)
These are the scripts that I showed you today, that we couldn't read.  Toni Amend said ok if we fax new orders to [employer] [facility] on Monday.  Fax number [phone].

## 2012-10-15 NOTE — Telephone Encounter (Signed)
Orders written and in my box. 

## 2012-10-15 NOTE — Telephone Encounter (Signed)
Sherry nurse with Hospice left v/m; has faxed order request for pt to restart her Lasix due to edema of legs. Sherry request call back or fax order for Lasix to [facility] fax # [phone] due to leg edema.

## 2012-10-18 ENCOUNTER — Telehealth: Payer: Self-pay | Admitting: Family Medicine

## 2012-10-18 NOTE — Telephone Encounter (Signed)
I cannot answer that without knowing the dose she was sent home with.

## 2012-10-18 NOTE — Telephone Encounter (Signed)
[facility] Triage Call Report Triage Record Num: [id number] Operator: Caswell Corwin Patient Name: [name] Call Date & Time: 10/17/2012 9:23:04PM Patient Phone: [phone] PCP: [name] Patient Gender: Female PCP Fax : [phone] Patient DOB: 25-Mar-1931 [facility] Name: [facility] Reason for Call: Caller: Tillman/Med Tech from [facility] calling. PCP: [name] (Family Practice); CB#: [phone]; Call regarding Missed 2 doses of prednisone, Needs new order. Pt has a Medrol dosepack 4 mg, 6 day pack and she is supposed to be on day 3 today 10/17/12. Strarted 10/15/12, 10/16/12 received 1 pill, and today received no pills. Pt having no medical SX. There is no documented reason why pt did not get Prednisone. Called Dr. Governor Specking and he states this can wait until 10/18/12 for provider to check. It is not an emergency. Fax sent to facility at [phone]. Protocol(s) Used: Medication Questions - Adult Recommended Outcome per Protocol: Provided Health Information Reason for Outcome: Caller has medication question(s) that was answered with available resources Care Advice: ~ 10/17/2012 9:45:29PM Page 1 of 1 [email or web address]

## 2012-10-18 NOTE — Telephone Encounter (Signed)
[name] states pt is about the same,not having any shortness of breath, has recently started lasix.

## 2012-10-18 NOTE — Telephone Encounter (Signed)
Please call to check on pt. 

## 2012-10-18 NOTE — Telephone Encounter (Signed)
Rx written. On my desk.

## 2012-10-18 NOTE — Telephone Encounter (Signed)
Order faxed to [facility].

## 2012-10-18 NOTE — Telephone Encounter (Signed)
[facility] Triage Call Report Triage Record Num: [id number] Operator: Rebeca Allegra Patient Name: [name] Call Date & Time: 10/17/2012 7:20:55PM Patient Phone: [phone] PCP: Patient Gender: Female PCP Fax : Patient DOB: 03-31-1931 Practice Name: [facility] Reason for Call: Caller: Tillman/Med Tech; PCP: [name] (Family Practice); CB#: [phone]; Call regarding Requesting Orders; upon callback, [name] was occupied and will f/u when available. Protocol(s) Used: Office Note Recommended Outcome per Protocol: Information Noted and Sent to Office Reason for Outcome: Caller information to office Care Advice: ~ 10/17/2012 7:23:22PM Page 1 of 1 [email or web address]

## 2012-10-18 NOTE — Telephone Encounter (Signed)
Restart it at 5 pills ( or wherever it was left off) and taper down.

## 2012-10-18 NOTE — Telephone Encounter (Signed)
Caller: Tillman/; Phone: [phone]; Reason for Call: Caller reports pt returned to the facility on 10/13/12 from the hospital with an order to start Prednisone (6 day course).  Pt started the medication on the 5/2 (6 pills).  Caller states pt has not received the 5/3 or the 5/4 dose.  Caller wants to know what dose she should give the patient today?  OFFICE PLEASE FOLLOW UP WITH CALLER AND FAX ORDER FROM MD to [phone]

## 2012-10-18 NOTE — Telephone Encounter (Signed)
I'm [place] confused.  What does she mean by 4 mg tapered dose pack?  Prednisone doesn't come in 4 mg so likely it's dexamethasone or another steroid.   Does she mean she received six 4 mg tablets (24 mg ) on first day?

## 2012-10-18 NOTE — Telephone Encounter (Signed)
[name] states pt was given a 4 mg tapered dose pack when she left the hospital.  She received the correct dose of 6 tablets on 5/2 but was only given one on 5/3 and has been given none since.

## 2012-10-18 NOTE — Telephone Encounter (Signed)
Order faxed.

## 2012-10-18 NOTE — Telephone Encounter (Signed)
Tillman notified as instructed by telephone. Tillman requested that this order with a signature be faxed to her at [phone].

## 2012-10-18 NOTE — Telephone Encounter (Signed)
Left message asking [name] to call me back.

## 2012-10-18 NOTE — Telephone Encounter (Signed)
tillman says order from the hospital is for methylprednisone 4 mg dose pack, one dose pack of 21 each, follow directions on pack, provide one pack. On day one she was given 6 pills, the next day she was given 1 pill.

## 2012-10-21 ENCOUNTER — Other Ambulatory Visit: Payer: Self-pay | Admitting: *Deleted

## 2012-10-21 MED ORDER — BUDESONIDE 0.25 MG/2ML IN SUSP: 0.2500 mg | Freq: Two times a day (BID) | RESPIRATORY_TRACT | Status: DC

## 2012-10-21 NOTE — Telephone Encounter (Signed)
Rx was prescribed while pt was in hospital, are you ok with refilling?

## 2012-10-25 ENCOUNTER — Telehealth: Payer: Self-pay | Admitting: Family Medicine

## 2012-10-25 NOTE — Telephone Encounter (Signed)
Order faxed.

## 2012-10-25 NOTE — Telephone Encounter (Signed)
[facility] Triage Call Report Triage Record Num: [id number] Operator: Peri Jefferson Patient Name: [name] Call Date & Time: 10/24/2012 10:00:08AM Patient Phone: [phone] PCP: [name] Patient Gender: Female PCP Fax : [phone] Patient DOB: August 03, 1930 Practice Name[facility] Reason for Call: Caller: Wanda/RN; PCP: [name] (Family Practice); CB#: [phone]; Call regarding Script request; VO per Dr. Caryl Never: Fax orders to [facility]. Morphine 15mg  TID may give additional dose at 3 AM if needed. Xanax 1mg  TID may give additional dose at 3 AM if needed. Orders faxed to [phone]. Protocol(s) Used: Office Note Recommended Outcome per Protocol: Information Noted and Sent to Office Reason for Outcome: Caller information to office Care Advice: ~ 10/24/2012 10:03:33AM Page 1 of 1 [email or web address]

## 2012-10-25 NOTE — Telephone Encounter (Signed)
Rx written and in my box. 

## 2012-10-28 ENCOUNTER — Other Ambulatory Visit: Payer: Self-pay | Admitting: *Deleted

## 2012-10-28 MED ORDER — BENZONATATE 200 MG PO CAPS: 200.0000 mg | ORAL_CAPSULE | Freq: Three times a day (TID) | ORAL | Status: DC

## 2012-10-29 DIAGNOSIS — J449 Chronic obstructive pulmonary disease, unspecified: Secondary | ICD-10-CM

## 2012-10-29 DIAGNOSIS — A419 Sepsis, unspecified organism: Secondary | ICD-10-CM

## 2012-10-29 DIAGNOSIS — G9340 Encephalopathy, unspecified: Secondary | ICD-10-CM

## 2012-10-29 DIAGNOSIS — J9620 Acute and chronic respiratory failure, unspecified whether with hypoxia or hypercapnia: Secondary | ICD-10-CM

## 2012-11-01 ENCOUNTER — Other Ambulatory Visit: Payer: Self-pay | Admitting: *Deleted

## 2012-11-01 MED ORDER — POTASSIUM CHLORIDE CRYS ER 10 MEQ PO TBCR: 10.0000 meq | EXTENDED_RELEASE_TABLET | Freq: Every day | ORAL | Status: AC

## 2012-11-01 MED ORDER — LOSARTAN POTASSIUM 50 MG PO TABS: 50.0000 mg | ORAL_TABLET | Freq: Every day | ORAL | Status: DC

## 2012-11-01 MED ORDER — FUROSEMIDE 20 MG PO TABS: 20.0000 mg | ORAL_TABLET | Freq: Every day | ORAL | Status: AC

## 2012-11-01 NOTE — Telephone Encounter (Signed)
Faxed refill requests from [facility].  They are also requesting klor con 10 meq and lasix 20 mg's, one of each every day.  I dont see these on med list.  Please advise.

## 2012-11-01 NOTE — Telephone Encounter (Signed)
Ok to refill as requested 

## 2012-11-05 ENCOUNTER — Telehealth: Payer: Self-pay | Admitting: Family Medicine

## 2012-11-05 MED ORDER — NITROFURANTOIN MONOHYD MACRO 100 MG PO CAPS: 100.0000 mg | ORAL_CAPSULE | Freq: Two times a day (BID) | ORAL | Status: DC

## 2012-11-05 NOTE — Telephone Encounter (Signed)
Spoke with [facility] and they did not have information needed about patient so dr Ermalene Searing called to get information

## 2012-11-05 NOTE — Telephone Encounter (Signed)
Labs came back from this nursing home pt of Dr. Elmer Sow... Preliminary urine culture showed ecoli > 100, 000 but sensitivities not back yet. Please call facility to determine if she is on antibiotics already for this and document that she is symptomatic.. ie urine cahnge, dysuria.   Let me know ASAP.

## 2012-11-05 NOTE — Telephone Encounter (Signed)
I called back... Nurse stated that pt ahd urine drawn for altered mental status, but no urinary symptoms. She is stable at this time.  Not started on antibiotics yet.  Culture positive.. sensitivities pending. I will fax over prescription to start her on macrobid given her sulfa allergy and hallucinations with moxifloxacin in past

## 2012-11-06 ENCOUNTER — Emergency Department: Payer: Self-pay | Admitting: Emergency Medicine

## 2012-11-06 LAB — COMPREHENSIVE METABOLIC PANEL
Albumin: 2.8 g/dL — ABNORMAL LOW (ref 3.4–5.0)
Alkaline Phosphatase: 105 U/L (ref 50–136)
Anion Gap: 2 — ABNORMAL LOW (ref 7–16)
BUN: 15 mg/dL (ref 7–18)
Bilirubin,Total: 0.3 mg/dL (ref 0.2–1.0)
Calcium, Total: 8.6 mg/dL (ref 8.5–10.1)
Chloride: 104 mmol/L (ref 98–107)
EGFR (African American): 50 — ABNORMAL LOW
Glucose: 118 mg/dL — ABNORMAL HIGH (ref 65–99)
Osmolality: 287 (ref 275–301)
Potassium: 3.5 mmol/L (ref 3.5–5.1)
SGOT(AST): 34 U/L (ref 15–37)
Total Protein: 6 g/dL — ABNORMAL LOW (ref 6.4–8.2)

## 2012-11-06 LAB — CBC
HGB: 9.5 g/dL — ABNORMAL LOW (ref 12.0–16.0)
MCHC: 31.3 g/dL — ABNORMAL LOW (ref 32.0–36.0)
MCV: 82 fL (ref 80–100)
RBC: 3.69 10*6/uL — ABNORMAL LOW (ref 3.80–5.20)
RDW: 16.1 % — ABNORMAL HIGH (ref 11.5–14.5)

## 2012-11-09 ENCOUNTER — Other Ambulatory Visit: Payer: Self-pay | Admitting: *Deleted

## 2012-11-09 MED ORDER — BENZONATATE 200 MG PO CAPS: 200.0000 mg | ORAL_CAPSULE | Freq: Three times a day (TID) | ORAL | Status: DC

## 2012-11-09 NOTE — Telephone Encounter (Signed)
Faxed refill request, last filled 20 on 10/28/12.

## 2012-11-10 ENCOUNTER — Ambulatory Visit: Payer: Medicare Other | Admitting: Critical Care Medicine

## 2012-11-12 ENCOUNTER — Encounter: Payer: Self-pay | Admitting: Family Medicine

## 2012-11-16 ENCOUNTER — Encounter: Payer: Self-pay | Admitting: Family Medicine

## 2012-11-16 ENCOUNTER — Other Ambulatory Visit: Payer: Self-pay | Admitting: *Deleted

## 2012-11-16 MED ORDER — ALBUTEROL SULFATE (2.5 MG/3ML) 0.083% IN NEBU: 2.5000 mg | INHALATION_SOLUTION | Freq: Four times a day (QID) | RESPIRATORY_TRACT | Status: DC

## 2012-11-24 ENCOUNTER — Other Ambulatory Visit: Payer: Self-pay | Admitting: *Deleted

## 2012-11-24 MED ORDER — OMEPRAZOLE 20 MG PO CPDR: 20.0000 mg | DELAYED_RELEASE_CAPSULE | Freq: Two times a day (BID) | ORAL | Status: DC

## 2012-11-24 MED ORDER — ATORVASTATIN CALCIUM 40 MG PO TABS: 40.0000 mg | ORAL_TABLET | Freq: Every day | ORAL | Status: DC

## 2012-11-30 ENCOUNTER — Other Ambulatory Visit: Payer: Self-pay | Admitting: Family Medicine

## 2012-11-30 ENCOUNTER — Other Ambulatory Visit: Payer: Self-pay | Admitting: *Deleted

## 2012-11-30 MED ORDER — MORPHINE SULFATE 15 MG PO TABS: 15.0000 mg | ORAL_TABLET | Freq: Three times a day (TID) | ORAL | Status: DC

## 2012-11-30 MED ORDER — FLUTICASONE PROPIONATE 50 MCG/ACT NA SUSP: 1.0000 | Freq: Every day | NASAL | Status: DC

## 2012-11-30 MED ORDER — ALPRAZOLAM 1 MG PO TABS: ORAL_TABLET | ORAL | Status: DC

## 2012-11-30 NOTE — Telephone Encounter (Signed)
Received faxed refill request from pharmacy. See allergy/contraindication. Last office visit 09/30/12. Is it okay to refill?

## 2012-11-30 NOTE — Telephone Encounter (Signed)
Scripts faxed to [facility].

## 2012-11-30 NOTE — Telephone Encounter (Signed)
Clyda Hurdle from [facility] requesting new RX for Alprazolam and Morphine.  Phone: [phone]/Fax: [phone].

## 2012-12-03 ENCOUNTER — Other Ambulatory Visit: Payer: Self-pay | Admitting: *Deleted

## 2012-12-03 MED ORDER — BUDESONIDE 0.25 MG/2ML IN SUSP: 0.2500 mg | Freq: Two times a day (BID) | RESPIRATORY_TRACT | Status: AC

## 2012-12-06 ENCOUNTER — Encounter (HOSPITAL_COMMUNITY): Payer: Self-pay | Admitting: Emergency Medicine

## 2012-12-06 ENCOUNTER — Emergency Department (HOSPITAL_COMMUNITY)

## 2012-12-06 ENCOUNTER — Emergency Department (HOSPITAL_COMMUNITY)
Admission: EM | Admit: 2012-12-06 | Discharge: 2012-12-06 | Disposition: A | Discharge status: Skilled Nursing Facility | Attending: Emergency Medicine | Admitting: Emergency Medicine

## 2012-12-06 DIAGNOSIS — R062 Wheezing: Secondary | ICD-10-CM | POA: Insufficient documentation

## 2012-12-06 DIAGNOSIS — Z79899 Other long term (current) drug therapy: Secondary | ICD-10-CM | POA: Insufficient documentation

## 2012-12-06 DIAGNOSIS — R059 Cough, unspecified: Secondary | ICD-10-CM | POA: Insufficient documentation

## 2012-12-06 DIAGNOSIS — Z8739 Personal history of other diseases of the musculoskeletal system and connective tissue: Secondary | ICD-10-CM | POA: Insufficient documentation

## 2012-12-06 DIAGNOSIS — Z872 Personal history of diseases of the skin and subcutaneous tissue: Secondary | ICD-10-CM | POA: Insufficient documentation

## 2012-12-06 DIAGNOSIS — I10 Essential (primary) hypertension: Secondary | ICD-10-CM | POA: Insufficient documentation

## 2012-12-06 DIAGNOSIS — Z87891 Personal history of nicotine dependence: Secondary | ICD-10-CM | POA: Insufficient documentation

## 2012-12-06 DIAGNOSIS — Z8669 Personal history of other diseases of the nervous system and sense organs: Secondary | ICD-10-CM | POA: Insufficient documentation

## 2012-12-06 DIAGNOSIS — R05 Cough: Secondary | ICD-10-CM | POA: Insufficient documentation

## 2012-12-06 DIAGNOSIS — Z860100 Personal history of colon polyps, unspecified: Secondary | ICD-10-CM | POA: Insufficient documentation

## 2012-12-06 DIAGNOSIS — I509 Heart failure, unspecified: Secondary | ICD-10-CM | POA: Insufficient documentation

## 2012-12-06 DIAGNOSIS — Z880 Allergy status to penicillin: Secondary | ICD-10-CM | POA: Insufficient documentation

## 2012-12-06 DIAGNOSIS — K219 Gastro-esophageal reflux disease without esophagitis: Secondary | ICD-10-CM | POA: Insufficient documentation

## 2012-12-06 DIAGNOSIS — Z8639 Personal history of other endocrine, nutritional and metabolic disease: Secondary | ICD-10-CM | POA: Insufficient documentation

## 2012-12-06 DIAGNOSIS — Z8679 Personal history of other diseases of the circulatory system: Secondary | ICD-10-CM | POA: Insufficient documentation

## 2012-12-06 DIAGNOSIS — J441 Chronic obstructive pulmonary disease with (acute) exacerbation: Secondary | ICD-10-CM | POA: Insufficient documentation

## 2012-12-06 DIAGNOSIS — Z862 Personal history of diseases of the blood and blood-forming organs and certain disorders involving the immune mechanism: Secondary | ICD-10-CM | POA: Insufficient documentation

## 2012-12-06 DIAGNOSIS — Z8742 Personal history of other diseases of the female genital tract: Secondary | ICD-10-CM | POA: Insufficient documentation

## 2012-12-06 DIAGNOSIS — M7989 Other specified soft tissue disorders: Secondary | ICD-10-CM | POA: Insufficient documentation

## 2012-12-06 DIAGNOSIS — Z9981 Dependence on supplemental oxygen: Secondary | ICD-10-CM | POA: Insufficient documentation

## 2012-12-06 DIAGNOSIS — Z8719 Personal history of other diseases of the digestive system: Secondary | ICD-10-CM | POA: Insufficient documentation

## 2012-12-06 LAB — CBC WITH DIFFERENTIAL/PLATELET
Basophils Absolute: 0 10*3/uL (ref 0.0–0.1)
Basophils Relative: 0 % (ref 0–1)
Eosinophils Absolute: 0.2 10*3/uL (ref 0.0–0.7)
Eosinophils Relative: 3 % (ref 0–5)
HCT: 31.7 % — ABNORMAL LOW (ref 36.0–46.0)
Hemoglobin: 9.6 g/dL — ABNORMAL LOW (ref 12.0–15.0)
MCH: 25.9 pg — ABNORMAL LOW (ref 26.0–34.0)
MCHC: 30.3 g/dL (ref 30.0–36.0)
Monocytes Absolute: 0.6 10*3/uL (ref 0.1–1.0)
Monocytes Relative: 10 % (ref 3–12)
Neutro Abs: 3.9 10*3/uL (ref 1.7–7.7)
RDW: 15.5 % (ref 11.5–15.5)

## 2012-12-06 LAB — BLOOD GAS, ARTERIAL
Acid-Base Excess: 3.3 mmol/L — ABNORMAL HIGH (ref 0.0–2.0)
Drawn by: 295031
O2 Content: 3 L/min
pCO2 arterial: 52.2 mmHg — ABNORMAL HIGH (ref 35.0–45.0)
pH, Arterial: 7.361 (ref 7.350–7.450)
pO2, Arterial: 133 mmHg — ABNORMAL HIGH (ref 80.0–100.0)

## 2012-12-06 LAB — COMPREHENSIVE METABOLIC PANEL
AST: 22 U/L (ref 0–37)
Albumin: 3.3 g/dL — ABNORMAL LOW (ref 3.5–5.2)
BUN: 14 mg/dL (ref 6–23)
Calcium: 9.5 mg/dL (ref 8.4–10.5)
Creatinine, Ser: 1.03 mg/dL (ref 0.50–1.10)
Total Bilirubin: 0.3 mg/dL (ref 0.3–1.2)
Total Protein: 6.7 g/dL (ref 6.0–8.3)

## 2012-12-06 LAB — POCT I-STAT TROPONIN I: Troponin i, poc: 0 ng/mL (ref 0.00–0.08)

## 2012-12-06 LAB — PRO B NATRIURETIC PEPTIDE: Pro B Natriuretic peptide (BNP): 360.1 pg/mL (ref 0–450)

## 2012-12-06 MED ORDER — PREDNISONE 10 MG PO TABS: 20.0000 mg | ORAL_TABLET | Freq: Every day | ORAL | Status: DC

## 2012-12-06 MED ORDER — ALPRAZOLAM 0.5 MG PO TABS: 1.0000 mg | ORAL_TABLET | Freq: Three times a day (TID) | ORAL | Status: DC

## 2012-12-06 MED ORDER — METHYLPREDNISOLONE SODIUM SUCC 125 MG IJ SOLR
125.0000 mg | Freq: Once | INTRAMUSCULAR | Status: AC
  Administered 2012-12-06: 125 mg via INTRAVENOUS
  Filled 2012-12-06: qty 2

## 2012-12-06 MED ORDER — IPRATROPIUM BROMIDE 0.02 % IN SOLN
0.5000 mg | Freq: Once | RESPIRATORY_TRACT | Status: AC
  Administered 2012-12-06: 0.5 mg via RESPIRATORY_TRACT
  Filled 2012-12-06: qty 2.5

## 2012-12-06 MED ORDER — ALPRAZOLAM 0.5 MG PO TABS
1.0000 mg | ORAL_TABLET | Freq: Once | ORAL | Status: AC
  Administered 2012-12-06: 1 mg via ORAL
  Filled 2012-12-06: qty 2

## 2012-12-06 MED ORDER — MORPHINE SULFATE 30 MG PO TABS: 15.0000 mg | ORAL_TABLET | Freq: Three times a day (TID) | ORAL | Status: DC

## 2012-12-06 MED ORDER — ALBUTEROL SULFATE (5 MG/ML) 0.5% IN NEBU
5.0000 mg | INHALATION_SOLUTION | Freq: Once | RESPIRATORY_TRACT | Status: AC
  Administered 2012-12-06: 5 mg via RESPIRATORY_TRACT
  Filled 2012-12-06: qty 1

## 2012-12-06 MED ORDER — MORPHINE SULFATE 30 MG PO TABS
15.0000 mg | ORAL_TABLET | Freq: Once | ORAL | Status: AC
  Administered 2012-12-06: 15 mg via ORAL
  Filled 2012-12-06: qty 1

## 2012-12-06 MED ORDER — IPRATROPIUM BROMIDE 0.02 % IN SOLN
0.5000 mg | Freq: Once | RESPIRATORY_TRACT | Status: AC
Start: 2012-12-06 — End: 2012-12-06
  Administered 2012-12-06: 0.5 mg via RESPIRATORY_TRACT
  Filled 2012-12-06: qty 2.5

## 2012-12-06 NOTE — ED Provider Notes (Addendum)
History     CSN: [id number]  Arrival date & time 12/06/12  1317   First MD Initiated Contact with Patient 12/06/12 1320      Chief Complaint  Patient presents with  . Shortness of Breath  . Cough    (Consider location/radiation/quality/duration/timing/severity/associated sxs/prior treatment) Patient is a 79 y.o. female presenting with shortness of breath and cough. The history is provided by the patient.  Shortness of Breath Severity:  Moderate Onset quality:  Gradual Duration:  2 days Timing:  Constant Progression:  Worsening Chronicity:  Recurrent Context comment:  States wears 3L [place] all the time but yesterday started feeling SOB despite breathing treatments. Relieved by:  Inhaler Worsened by:  Exertion Ineffective treatments:  Inhaler Associated symptoms: cough and wheezing   Associated symptoms: no abdominal pain, no chest pain, no fever, no sputum production and no vomiting   Associated symptoms comment:  Leg swelling the last few days Risk factors: no hx of PE/DVT and no prolonged immobilization   Risk factors comment:  Former smoker.  hx of COPD and CHF Cough Associated symptoms: shortness of breath and wheezing   Associated symptoms: no chest pain and no fever     Past Medical History  Diagnosis Date  . History of colonic polyps   . COPD (chronic obstructive pulmonary disease)   . Diverticulitis of colon   . GERD (gastroesophageal reflux disease)   . HTN (hypertension)   . Osteoarthritis   . Hyperlipemia   . Aortic aneurysm, abdominal   . Ovarian cyst, left   . Psoriasis   . Chronic respiratory failure     Uses home oxygen at 2 L  . ADRENAL MASS 05/04/2007  . GLAUCOMA 03/30/2007  . HEMORRHOIDS, INTERNAL 03/23/2008  . OSTEOPOROSIS 03/30/2007  . SCOLIOSIS 03/30/2007  . PONV (postoperative nausea and vomiting)     Past Surgical History  Procedure Laterality Date  . Kidney stone surgery    . Appendectomy    . Tonsillectomy    . Hemiarthroplasty  shoulder fracture    . Cataract extraction, bilateral    . Abdominal aortic aneurysm repair    . Abdominal hysterectomy      Family History  Problem Relation Age of Onset  . Heart attack Mother   . Heart attack Father   . Deep vein thrombosis Father   . Cancer Brother     Pancreas and colon    History  Substance Use Topics  . Smoking status: Former Smoker -- 0.50 packs/day for 40 years    Types: Cigarettes    Quit date: 06/16/2009  . Smokeless tobacco: Never Used  . Alcohol Use: No    OB History   Grav Para Term Preterm Abortions TAB SAB Ect Mult Living                  Review of Systems  Constitutional: Negative for fever.  Respiratory: Positive for cough, shortness of breath and wheezing. Negative for sputum production.   Cardiovascular: Positive for leg swelling. Negative for chest pain.  Gastrointestinal: Negative for nausea, vomiting and abdominal pain.  All other systems reviewed and are negative.    Allergies  Clarithromycin; Codeine; Hydrocodone-guaifenesin; Iohexol; Moxifloxacin; Penicillins; Prednisone; Simvastatin; and Sulfonamide derivatives  Home Medications   Current Outpatient Rx  Name  Route  Sig  Dispense  Refill  . acetaminophen (TYLENOL) 325 MG tablet   Oral   Take 650 mg by mouth every 6 (six) hours as needed for pain or fever.         [name]  albuterol (PROVENTIL) (2.5 MG/3ML) 0.083% nebulizer solution   Nebulization   Take 3 mLs (2.5 mg total) by nebulization 4 (four) times daily.   150 mL   5   . albuterol (PROVENTIL) (2.5 MG/3ML) 0.083% nebulizer solution   Nebulization   Take 2.5 mg by nebulization every 6 (six) hours as needed for wheezing or shortness of breath.         . ALPRAZolam (XANAX) 1 MG tablet   Oral   Take 1-1.5 mg by mouth 3 (three) times daily. 1 tab bid, 1.5 tabs qhs         . Alum & Mag Hydroxide-Simeth (MAGIC MOUTHWASH W/LIDOCAINE) SOLN   Oral   Take 5 mLs by mouth 4 (four) times daily as needed (for mouth  pain).         [name] antiseptic oral rinse (BIOTENE) LIQD   Mouth Rinse   15 mLs by Mouth Rinse route as needed (for antiseptic).         . budesonide (PULMICORT) 0.25 MG/2ML nebulizer solution   Nebulization   Take 2 mLs (0.25 mg total) by nebulization 2 (two) times daily.   60 mL   5   . chlorpheniramine-HYDROcodone (TUSSIONEX) 10-8 MG/5ML LQCR   Oral   Take 5 mLs by mouth every 12 (twelve) hours as needed (for cough).         . docusate sodium (COLACE) 100 MG capsule   Oral   Take 100 mg by mouth 2 (two) times daily.         . fluticasone (FLONASE) 50 MCG/ACT nasal spray   Nasal   Place 1 spray into the nose daily.   16 g   1   . furosemide (LASIX) 20 MG tablet   Oral   Take 1 tablet (20 mg total) by mouth daily.   30 tablet   5   . ipratropium (ATROVENT) 0.02 % nebulizer solution   Nebulization   Take 2.5 mLs (0.5 mg total) by nebulization every 6 (six) hours.   75 mL   0   . losartan (COZAAR) 50 MG tablet   Oral   Take 1 tablet (50 mg total) by mouth daily. Hold for sbp <  Or = 100   30 tablet   5   . morphine (MSIR) 15 MG tablet   Oral   Take 1 tablet (15 mg total) by mouth 3 (three) times daily.   30 tablet   0   . morphine (ROXANOL) 20 MG/ML concentrated solution   Oral   Take 10 mg by mouth every 2 (two) hours as needed for pain (or shortness of breath).         [name] omeprazole (PRILOSEC) 20 MG capsule   Oral   Take 1 capsule (20 mg total) by mouth 2 (two) times daily before a meal.   60 capsule   5   . polyethylene glycol (MIRALAX / GLYCOLAX) packet   Oral   Take 17 g by mouth daily as needed (for constipation).         . potassium chloride (KLOR-CON M10) 10 MEQ tablet   Oral   Take 1 tablet (10 mEq total) by mouth daily.   30 tablet   5   . prochlorperazine (COMPAZINE) 10 MG tablet   Oral   Take 10 mg by mouth every 6 (six) hours as needed (for nausea/vomiting).         . simethicone (MYLICON) 80 MG chewable tablet   Oral  Chew 80 mg by mouth every 4 (four) hours as needed for flatulence.         . sodium chloride (OCEAN) 0.65 % nasal spray   Nasal   Place 1 spray into the nose as needed for congestion.         [name] Spacer/Aero-Holding Chambers (AEROCHAMBER MV) inhaler      Use as instructed   1 each   0     BP 120/84  Pulse 100  Temp(Src) 99.3 F (37.4 C) (Rectal)  Resp 24  SpO2 99%  Physical Exam  Nursing note and vitals reviewed. Constitutional: She is oriented to person, place, and time. She appears well-developed and well-nourished. No distress.  HENT:  Head: Normocephalic and atraumatic.  Mouth/Throat: Oropharynx is clear and moist.  Eyes: Conjunctivae and EOM are normal. Pupils are equal, round, and reactive to light.  Neck: Normal range of motion. Neck supple.  Cardiovascular: Normal rate, regular rhythm and intact distal pulses.   No murmur heard. Pulmonary/Chest: No accessory muscle usage. Tachypnea noted. No respiratory distress. She has wheezes. She has rales in the right lower field and the left lower field.  Abdominal: Soft. She exhibits no distension. There is no tenderness. There is no rebound and no guarding.  Musculoskeletal: Normal range of motion. She exhibits edema. She exhibits no tenderness.  1+ edema in lower ext L>R.  Also left foot with ecchyomsis and pain over the proximal toes  Neurological: She is alert and oriented to person, place, and time.  Skin: Skin is warm and dry. No rash noted. No erythema.  Psychiatric: She has a normal mood and affect. Her behavior is normal.    ED Course  Procedures (including critical care time)  Labs Reviewed  CBC WITH DIFFERENTIAL - Abnormal; Notable for the following:    RBC 3.71 (*)    Hemoglobin 9.6 (*)    HCT 31.7 (*)    MCH 25.9 (*)    All other components within normal limits  COMPREHENSIVE METABOLIC PANEL - Abnormal; Notable for the following:    Glucose, Bld 104 (*)    Albumin 3.3 (*)    GFR calc non Af Amer 50  (*)    GFR calc Af Amer 58 (*)    All other components within normal limits  BLOOD GAS, ARTERIAL - Abnormal; Notable for the following:    pCO2 arterial 52.2 (*)    pO2, Arterial 133.0 (*)    Bicarbonate 28.9 (*)    Acid-Base Excess 3.3 (*)    All other components within normal limits  PRO B NATRIURETIC PEPTIDE  POCT I-STAT TROPONIN I   Dg Chest 2 View  12/06/2012   s*RADIOLOGY REPORT*  Clinical Data: 79 year old female with chest tightness and shortness of breath.  CHEST - 2 VIEW  Comparison: 10/12/2012 and earlier.  Findings: Upright AP and lateral views of the chest.  Stable lung volumes.  Stable cardiac size and mediastinal contours.  Tortuous descending thoracic aorta.  Small pleural effusions have resolved. No pneumothorax or pulmonary edema.  No acute pulmonary opacity. Postoperative changes at the left shoulder are stable.  Scoliosis, and osteopenia with no definite acute osseous abnormality.  IMPRESSION: Resolved small pleural effusions.  No new cardiopulmonary abnormality.   Original Report Authenticated By: Erskine Speed, M.D.   Dg Foot Complete Left  12/06/2012   *RADIOLOGY REPORT*  Clinical Data: 79 year old female with left foot bruising of unknown etiology.  LEFT FOOT - COMPLETE 3+ VIEW  Comparison: None.  Findings: Hallux valgus and metatarsus primus varus.  Calcaneus intact.  There is some calcified atherosclerosis about the ankle. No acute fracture or dislocation.  No subcutaneous gas.  IMPRESSION: No acute osseous abnormality identified about the left foot.   Original Report Authenticated By: Erskine Speed, M.D.     Date: 12/06/2012  Rate: 97  Rhythm: normal sinus rhythm  QRS Axis: left  Intervals: normal  ST/T Wave abnormalities: nonspecific ST/T changes  Conduction Disutrbances:none  Narrative Interpretation:   Old EKG Reviewed: unchanged    1. COPD exacerbation       MDM   Patient with a history of CHF/COPD on 3 L nasal cannula presents do to worsening  shortness of breath that started yesterday. She states she gets breathing treatments every 2 hours if not improving. She spoke with her physician Dr. Delford Field yesterday and has an appointment [facility] however symptoms worsened today and she presented. She denies any infectious symptoms states she has noticed her leg swelling. On exam patient has diffuse wheezing and lower lobe rales. She does have edema in the lower extremities.  CBC, ABG, CMP, BNP, troponin, chest x-ray pending. EKG unchanged. Patient given albuterol and Atrovent.  2:40 PM Plain films with resolution of pleural effusions.  ABG with compensated resp acidosis and BNP stable.  Foot film neg.  Feel this is a pure COPD exacerbation.  Will give solumedrol and a few more nebs and re-evaluate.  Pt feels she will be able to go home.      Gwyneth Sprout, MD 12/06/12 1610  Gwyneth Sprout, MD 12/06/12 1535

## 2012-12-06 NOTE — ED Notes (Addendum)
[facility] RT contacted to start pt on breathing tx as ordered.  While helping NT with rectal temp, noticed pt's L foot is red and bruised.  No swelling noted at this time.  Pt states she does not know what happened to it.  Reports she noticed her L foot was red and bruised.  Reports the night before she was not able to move her L leg and fell.  Pt also states that her L foot actually looks better.

## 2012-12-06 NOTE — ED Notes (Addendum)
Went in to speak to pt, pt has concerns re her meds at the facility staff being administered late, and thinks that this could be the reason why she keeps having theses episodes.  Pt asked this nurse if there is something that the EDP can give her to keep her from having severe SOB and avoid her from coming to the ED.  She's made aware that there is nothing the EDP can do to avoid her SOB episodes.  She also reports that her meds keeps changing and was told that it is the same meds but she does not know this. Per pt's son, pt has an appt with her pulmonologist tomorrow am.  Pt's son also explained that the manufacturer for pt's meds changes at times and pt does not comprehend.  Pt was able to sit on the side of the stretcher without diffculty, but with mild SOB.  Pt's son (POA) signed d/c instructions and rx for prednisone given to him.

## 2012-12-06 NOTE — Progress Notes (Signed)
CSW met with pt at bedside along with pt son. Patient shared she is a resident at [facility] and plans to return when medically stable. CSW awaiting further medical evaluation to determine patient disposition needs.   Catha Gosselin, LCSWA  [phone] .12/06/2012 1515pm

## 2012-12-06 NOTE — ED Notes (Signed)
Pt is from nursing home and has sob cough, pt is on oxygen 3l at home and hx copd having to increase her liters of oxygen and not able to walk around good. sats 85% on 3l.

## 2012-12-07 ENCOUNTER — Encounter: Payer: Self-pay | Admitting: Internal Medicine

## 2012-12-07 ENCOUNTER — Ambulatory Visit (INDEPENDENT_AMBULATORY_CARE_PROVIDER_SITE_OTHER): Admitting: Internal Medicine

## 2012-12-07 VITALS — BP 120/74 | HR 93 | Ht 60.0 in | Wt 111.8 lb

## 2012-12-07 DIAGNOSIS — J449 Chronic obstructive pulmonary disease, unspecified: Secondary | ICD-10-CM

## 2012-12-07 NOTE — Patient Instructions (Addendum)
Please call as needed  Make a follow up appointment with Dr Delford Field

## 2012-12-07 NOTE — Progress Notes (Signed)
Subjective:    Patient ID: [name], female    DOB: 06-26-1930, 79 y.o.   MRN: [id number]  HPI 79 y.o. . WF Copd Gold D Oxygen dependent.  Adm 2/13 , 11/13 , and 05/20/12 , 06/2012 , 2/ 2014  06/30/2012 Pt is worse over time.  If does any activity will get severly dyspneic. Pulmicort was helping but insurance will not cover  08/11/12 Acute OV  Dx w/ PNA w/ LLL opacity . Started on Levaquin on day 5/7 .  Breathing and cough are getting better. But abx are causing her severe nausea , no appetite , gas and bloating.  CXR today shows LLL atx vs developing aspdz.  No fever. No hemoptyiss or edema.  Doing well on O2 w/ good sats.   08/27/2012 Pt seen 2/26 for LLL PNA.  Levquin stopped.  Had side effects from levaquin GI in nature. CXR 2/26 showed resolving LLL PNA NOw:   No cough now.  No real wheezing.  Pt notes while getting a bath will wheeze.  Pt tfr to SNF. Pt now in rehab.  Will transition to LTC at [facility].  12/07/12- 89 yoF former smoker followed by Dr Delford Field for COPD ACUTE VISIT: PW patient-Increased SOB and went to [facility] ER yesterday: solumedrol, nebs. Not a cold. Gradual onset 5-7 days. Weight loss 136-> 111 since 10/11/12. Some nights pills at nursing home "look different". Breathing today close to normal. O2 3L/ [facility].  CXR 12/06/12 IMPRESSION:  Resolved small pleural effusions. No new cardiopulmonary  abnormality.  Original Report Authenticated By: Erskine Speed, M.D.   Past Medical History  Diagnosis Date  . History of colonic polyps   . COPD (chronic obstructive pulmonary disease)   . Diverticulitis of colon   . GERD (gastroesophageal reflux disease)   . HTN (hypertension)   . Osteoarthritis   . Hyperlipemia   . Aortic aneurysm, abdominal   . Ovarian cyst, left   . Psoriasis   . Chronic respiratory failure     Uses home oxygen at 2 L  . ADRENAL MASS 05/04/2007  . GLAUCOMA 03/30/2007  . HEMORRHOIDS, INTERNAL 03/23/2008  . OSTEOPOROSIS 03/30/2007   . SCOLIOSIS 03/30/2007  . PONV (postoperative nausea and vomiting)       Review of Systems  Constitutional:   No  weight loss, night sweats,  Fevers, chills,  +fatigue, or  lassitude.  HEENT:   No headaches,  Difficulty swallowing,  Tooth/dental problems, or  Sore throat,                No sneezing, itching, ear ache, nasal congestion, post nasal drip,   CV:  No chest pain,  Orthopnea, PND, swelling in lower extremities, anasarca, dizziness, palpitations, syncope.   GI  No bloody stools.   Resp:   No coughing up of blood.  [name]  No chest wall deformity  Skin: no rash or lesions.  GU: no dysuria, change in color of urine, no urgency or frequency.  No flank pain, no hematuria   MS:  No joint pain or swelling.  No decreased range of motion.  No back pain.  Psych:  No change in mood or affect. No depression or anxiety.  No memory loss.    Objective:   Physical Exam Filed Vitals:   12/07/12 1022  Height: 5' (1.524 m)  Weight: 111 lb 12.8 oz (50.712 kg)    Gen: Pleasant, well-nourished, in no distress, anxious, +wheelchair, O2 3L  ENT: No lesions,  mouth clear,  oropharynx clear, no postnasal drip  Neck: +1 JVD, no TMG, no carotid bruits  Lungs: No use of accessory muscles, no dullness to percussion, distant BS, poor air flow  Cardiovascular: RRR, heart sounds normal, no murmur or gallops, no peripheral edema  Abdomen: soft and NT, no HSM,  BS normal  Musculoskeletal: No deformities, no cyanosis or clubbing. + Edema L>R foot  Neuro: alert, non focal  Skin: Warm, no lesions or rashes  Dg Chest 2 View  12/06/2012   s*RADIOLOGY REPORT*  Clinical Data: 79 year old female with chest tightness and shortness of breath.  CHEST - 2 VIEW  Comparison: 10/12/2012 and earlier.  Findings: Upright AP and lateral views of the chest.  Stable lung volumes.  Stable cardiac size and mediastinal contours.  Tortuous descending thoracic aorta.  Small pleural effusions have resolved. No  pneumothorax or pulmonary edema.  No acute pulmonary opacity. Postoperative changes at the left shoulder are stable.  Scoliosis, and osteopenia with no definite acute osseous abnormality.  IMPRESSION: Resolved small pleural effusions.  No new cardiopulmonary abnormality.   Original Report Authenticated By: Erskine Speed, M.D.   Dg Foot Complete Left  12/06/2012   *RADIOLOGY REPORT*  Clinical Data: 79 year old female with left foot bruising of unknown etiology.  LEFT FOOT - COMPLETE 3+ VIEW  Comparison: None.  Findings: Hallux valgus and metatarsus primus varus.  Calcaneus intact.  There is some calcified atherosclerosis about the ankle. No acute fracture or dislocation.  No subcutaneous gas.  IMPRESSION: No acute osseous abnormality identified about the left foot.   Original Report Authenticated By: Erskine Speed, M.D.      Assessment & Plan:   No problem-specific assessment & plan notes found for this encounter.   Updated Medication List Outpatient Encounter Prescriptions as of 12/07/2012  Medication Sig Dispense Refill  . acetaminophen (TYLENOL) 325 MG tablet Take 650 mg by mouth every 6 (six) hours as needed for pain or fever.      [name] albuterol (PROVENTIL) (2.5 MG/3ML) 0.083% nebulizer solution Take 3 mLs (2.5 mg total) by nebulization 4 (four) times daily.  150 mL  5  . albuterol (PROVENTIL) (2.5 MG/3ML) 0.083% nebulizer solution Take 2.5 mg by nebulization every 6 (six) hours as needed for wheezing or shortness of breath.      . ALPRAZolam (XANAX) 1 MG tablet Take 1-1.5 mg by mouth 3 (three) times daily. 1 tab bid, 1.5 tabs qhs      . Alum & Mag Hydroxide-Simeth (MAGIC MOUTHWASH W/LIDOCAINE) SOLN Take 5 mLs by mouth 4 (four) times daily as needed (for mouth pain).      [name] antiseptic oral rinse (BIOTENE) LIQD 15 mLs by Mouth Rinse route as needed (for antiseptic).      . budesonide (PULMICORT) 0.25 MG/2ML nebulizer solution Take 2 mLs (0.25 mg total) by nebulization 2 (two) times daily.  60 mL  5   . chlorpheniramine-HYDROcodone (TUSSIONEX) 10-8 MG/5ML LQCR Take 5 mLs by mouth every 12 (twelve) hours as needed (for cough).      . docusate sodium (COLACE) 100 MG capsule Take 100 mg by mouth 2 (two) times daily.      . fluticasone (FLONASE) 50 MCG/ACT nasal spray Place 1 spray into the nose daily.  16 g  1  . furosemide (LASIX) 20 MG tablet Take 1 tablet (20 mg total) by mouth daily.  30 tablet  5  . ipratropium (ATROVENT) 0.02 % nebulizer solution Take 2.5 mLs (0.5 mg total) by nebulization  every 6 (six) hours.  75 mL  0  . losartan (COZAAR) 50 MG tablet Take 1 tablet (50 mg total) by mouth daily. Hold for sbp <  Or = 100  30 tablet  5  . morphine (MSIR) 15 MG tablet Take 1 tablet (15 mg total) by mouth 3 (three) times daily.  30 tablet  0  . morphine (ROXANOL) 20 MG/ML concentrated solution Take 10 mg by mouth every 2 (two) hours as needed for pain (or shortness of breath).      [name] omeprazole (PRILOSEC) 20 MG capsule Take 1 capsule (20 mg total) by mouth 2 (two) times daily before a meal.  60 capsule  5  . polyethylene glycol (MIRALAX / GLYCOLAX) packet Take 17 g by mouth daily as needed (for constipation).      . potassium chloride (KLOR-CON M10) 10 MEQ tablet Take 1 tablet (10 mEq total) by mouth daily.  30 tablet  5  . predniSONE (DELTASONE) 10 MG tablet Take 2 tablets (20 mg total) by mouth daily.  10 tablet  0  . prochlorperazine (COMPAZINE) 10 MG tablet Take 10 mg by mouth every 6 (six) hours as needed (for nausea/vomiting).      . simethicone (MYLICON) 80 MG chewable tablet Chew 80 mg by mouth every 4 (four) hours as needed for flatulence.      . sodium chloride (OCEAN) 0.65 % nasal spray Place 1 spray into the nose as needed for congestion.      [name] Spacer/Aero-Holding Chambers (AEROCHAMBER MV) inhaler Use as instructed  1 each  0   No facility-administered encounter medications on file as of 12/07/2012.

## 2012-12-08 ENCOUNTER — Telehealth: Payer: Self-pay | Admitting: *Deleted

## 2012-12-08 NOTE — Telephone Encounter (Signed)
noted 

## 2012-12-08 NOTE — Telephone Encounter (Signed)
Received following msg from PW:  Pt in ED 12/06/2012, needs f/u OV with Tammy Parrett soon pw ----- Message ----- From: SYSTEM Sent: 12/06/2012 5:13 PM To: Storm Frisk, MD  ---  Called, spoke with pt's son.  She is currently in an Assisted Living yesterday.  Pt was seen by Dr. Maple Hudson yesterday and was asked to f/u in 2 months.  Pt does have a pending OV with PW on Feb 08, 2013.  Son is aware to call office back if needed prior to this appt.  He verbalized understanding and voiced no further questions or concerns at this time.  Will sign off on msg and route to PW as FYI.

## 2012-12-10 ENCOUNTER — Other Ambulatory Visit: Payer: Self-pay | Admitting: *Deleted

## 2012-12-10 MED ORDER — IPRATROPIUM BROMIDE 0.02 % IN SOLN: 0.5000 mg | Freq: Four times a day (QID) | RESPIRATORY_TRACT | Status: DC

## 2012-12-15 ENCOUNTER — Other Ambulatory Visit: Payer: Self-pay

## 2012-12-15 MED ORDER — MORPHINE SULFATE 15 MG PO TABS: 15.0000 mg | ORAL_TABLET | Freq: Three times a day (TID) | ORAL | Status: DC

## 2012-12-15 MED ORDER — MORPHINE SULFATE (CONCENTRATE) 20 MG/ML PO SOLN: 10.0000 mg | ORAL | Status: DC | PRN

## 2012-12-15 NOTE — Telephone Encounter (Signed)
Cassie with [facility] request written rx faxed to [facility] fax # [phone] and faxed to [facility] pharmacy. Pt out of med. Cassie request call back when done.Please advise.

## 2012-12-15 NOTE — Telephone Encounter (Signed)
rx faxed to pharmacy manually [facility] and faxed to [facility] with hospice nurse and advised results

## 2012-12-22 ENCOUNTER — Encounter: Payer: Self-pay | Admitting: Internal Medicine

## 2012-12-22 NOTE — Assessment & Plan Note (Signed)
Recent non-specific exacerbation, now back to baseline. No changes to offer. Plan- return to scheduled f/u w/ Dr Delford Field

## 2012-12-29 ENCOUNTER — Ambulatory Visit (INDEPENDENT_AMBULATORY_CARE_PROVIDER_SITE_OTHER): Payer: Medicare Other | Admitting: Family Medicine

## 2012-12-29 ENCOUNTER — Encounter: Payer: Self-pay | Admitting: Family Medicine

## 2012-12-29 ENCOUNTER — Ambulatory Visit (INDEPENDENT_AMBULATORY_CARE_PROVIDER_SITE_OTHER)
Admission: RE | Admit: 2012-12-29 | Discharge: 2012-12-29 | Disposition: A | Discharge status: Home / Self Care | Payer: Medicare Other | Source: Ambulatory Visit | Attending: Family Medicine | Admitting: Family Medicine

## 2012-12-29 ENCOUNTER — Telehealth: Payer: Self-pay

## 2012-12-29 VITALS — BP 120/70 | HR 114 | Temp 98.2°F | Ht 60.0 in | Wt 113.0 lb

## 2012-12-29 DIAGNOSIS — S92309A Fracture of unspecified metatarsal bone(s), unspecified foot, initial encounter for closed fracture: Secondary | ICD-10-CM

## 2012-12-29 DIAGNOSIS — S92302A Fracture of unspecified metatarsal bone(s), left foot, initial encounter for closed fracture: Secondary | ICD-10-CM

## 2012-12-29 DIAGNOSIS — M79609 Pain in unspecified limb: Secondary | ICD-10-CM

## 2012-12-29 DIAGNOSIS — M79672 Pain in left foot: Secondary | ICD-10-CM

## 2012-12-29 DIAGNOSIS — M25572 Pain in left ankle and joints of left foot: Secondary | ICD-10-CM

## 2012-12-29 DIAGNOSIS — M25579 Pain in unspecified ankle and joints of unspecified foot: Secondary | ICD-10-CM

## 2012-12-29 NOTE — Telephone Encounter (Signed)
Pt seen today; [name] at [facility] request written order regarding boot pt is wearing; how long is pt to wear the boot, any restrictions for pt and does pt need physical therapy. Fax order to [phone].

## 2012-12-29 NOTE — Telephone Encounter (Signed)
done

## 2012-12-29 NOTE — Telephone Encounter (Signed)
Order faxed.

## 2012-12-29 NOTE — Progress Notes (Signed)
Nature conservation officer at [facility] [address] [place] [address] Phone: [phone] Fax: [phone]  Date:  12/29/2012   Name:  [name]   DOB:  15-May-1931   MRN:  [id number] Gender: female Age: 79 y.o.  Primary Physician:  [name], MD  Evaluating MD: Hannah Beat, MD   Chief Complaint: Edema   History of Present Illness:  [name] is a 79 y.o. pleasant patient who presents with the following:   Complex elderly patient with end-stage COPD:  L sided foot swelling.  2 weeks ago, was sitting at the foot of her bed and window runs up beside it. Sitting in her wingback chair, and foot was propped up on the window sill. It was up there for quite a time, and she fell. It never hurt at all. ALready had some edema on her right foot, and she did not pay it much attention. The now at [facility] and fell again.   At the nursing home, did some xrays at the nursing home. No broken bone (not available for review). > 2 weeks or more. Xrays, then fell again a week after that and went to the hospital. Back was hurting.   Now with diffuse forefoot pain and swelling.  Patient Active Problem List   Diagnosis Date Noted  . Hypokalemia 10/10/2012  . Endoleak of aortic graft 10/08/2012  . Acute encephalopathy 10/08/2012  . CAP (community acquired pneumonia) 10/07/2012  . Unspecified constipation 10/07/2012  . Hypotension 10/05/2012  . ARF (acute renal failure) 10/05/2012  . Hypoalbuminemia 07/03/2012  . Anemia 07/03/2012  . Anxiety 05/22/2012  . Chronic respiratory failure with hypercapnia 05/20/2012  . Dyslipidemia 05/20/2012  . Right shoulder pain 11/04/2011  . COPD (chronic obstructive pulmonary disease), Gold D    . GERD (gastroesophageal reflux disease)   . HTN (hypertension)   . Hyperlipemia   . Chronic respiratory failure   . Laceration of lower leg 03/21/2011  . RIB PAIN, LEFT SIDED 04/11/2010  . FATIGUE 10/31/2009  . SHINGLES 07/02/2009  .  SEBORRHEIC KERATOSIS 03/06/2009  . CHANGE IN BOWELS 07/21/2008  . GASTRITIS 03/29/2008  . DUODENITIS, WITHOUT HEMORRHAGE 03/29/2008  . CERUMEN IMPACTION, LEFT 03/28/2008  . DYSPHAGIA 03/28/2008  . HEMORRHOIDS, INTERNAL 03/23/2008  . HIATAL HERNIA 03/23/2008  . DYSPHAGIA UNSPECIFIED 03/21/2008  . BACK PAIN, LUMBAR 05/24/2007  . ADRENAL MASS 05/04/2007  . PSORIASIS 03/31/2007  . HYPERLIPIDEMIA 03/30/2007  . GLAUCOMA 03/30/2007  . HYPERTENSION 03/30/2007  . ABDOMINAL AORTIC ANEURYSM 03/30/2007  . OSTEOARTHRITIS 03/30/2007  . OSTEOPOROSIS 03/30/2007  . SCOLIOSIS 03/30/2007  . HIP PAIN, RIGHT 11/05/2006  . GROIN PAIN 11/05/2006    Past Medical History  Diagnosis Date  . History of colonic polyps   . COPD (chronic obstructive pulmonary disease)   . Diverticulitis of colon   . GERD (gastroesophageal reflux disease)   . HTN (hypertension)   . Osteoarthritis   . Hyperlipemia   . Aortic aneurysm, abdominal   . Ovarian cyst, left   . Psoriasis   . Chronic respiratory failure     Uses home oxygen at 2 L  . ADRENAL MASS 05/04/2007  . GLAUCOMA 03/30/2007  . HEMORRHOIDS, INTERNAL 03/23/2008  . OSTEOPOROSIS 03/30/2007  . SCOLIOSIS 03/30/2007  . PONV (postoperative nausea and vomiting)     Past Surgical History  Procedure Laterality Date  . Kidney stone surgery    . Appendectomy    . Tonsillectomy    . Hemiarthroplasty shoulder fracture    .  Cataract extraction, bilateral    . Abdominal aortic aneurysm repair    . Abdominal hysterectomy      History   Social History  . Marital Status: Married    Spouse Name: Richard    Number of Children: 2  . Years of Education: 11   Occupational History  . [employer]   . Factory work    Social History Main Topics  . Smoking status: Former Smoker -- 0.50 packs/day for 40 years    Types: Cigarettes    Quit date: 06/16/2009  . Smokeless tobacco: Never Used  . Alcohol Use: No  . Drug Use: No  . Sexually Active: No   Other Topics  Concern  . Not on file   Social History Narrative   Married.  Lives in [place] with her husband.  Normally independent of ADLs and ambulation.    Family History  Problem Relation Age of Onset  . Heart attack Mother   . Heart attack Father   . Deep vein thrombosis Father   . Cancer Brother     Pancreas and colon    Allergies  Allergen Reactions  . Clarithromycin     REACTION: abd pain  . Codeine   . Hydrocodone-Guaifenesin     REACTION: feels funny  . Iohexol      Desc: pt reports dyspnea and throat swelling about age 76 w/ IV contrast reaction when kidneys were being checked; 07/25/08 SL[facility]   . Moxifloxacin     REACTION: hallucinations  . Penicillins   . Prednisone Other (See Comments)    Can take but at a lower dosage  . Simvastatin   . Sulfonamide Derivatives     REACTION: unknown    Medication list has been reviewed and updated.  Outpatient Prescriptions Prior to Visit  Medication Sig Dispense Refill  . acetaminophen (TYLENOL) 325 MG tablet Take 650 mg by mouth every 6 (six) hours as needed for pain or fever.      [name] albuterol (PROVENTIL) (2.5 MG/3ML) 0.083% nebulizer solution Take 3 mLs (2.5 mg total) by nebulization 4 (four) times daily.  150 mL  5  . albuterol (PROVENTIL) (2.5 MG/3ML) 0.083% nebulizer solution Take 2.5 mg by nebulization every 6 (six) hours as needed for wheezing or shortness of breath.      . ALPRAZolam (XANAX) 1 MG tablet Take 1-1.5 mg by mouth 3 (three) times daily. 1 tab bid, 1.5 tabs qhs      . Alum & Mag Hydroxide-Simeth (MAGIC MOUTHWASH W/LIDOCAINE) SOLN Take 5 mLs by mouth 4 (four) times daily as needed (for mouth pain).      [name] antiseptic oral rinse (BIOTENE) LIQD 15 mLs by Mouth Rinse route as needed (for antiseptic).      . budesonide (PULMICORT) 0.25 MG/2ML nebulizer solution Take 2 mLs (0.25 mg total) by nebulization 2 (two) times daily.  60 mL  5  . chlorpheniramine-HYDROcodone (TUSSIONEX) 10-8 MG/5ML LQCR Take 5 mLs by mouth every 12  (twelve) hours as needed (for cough).      . docusate sodium (COLACE) 100 MG capsule Take 100 mg by mouth 2 (two) times daily.      . fluticasone (FLONASE) 50 MCG/ACT nasal spray Place 1 spray into the nose daily.  16 g  1  . furosemide (LASIX) 20 MG tablet Take 1 tablet (20 mg total) by mouth daily.  30 tablet  5  . ipratropium (ATROVENT) 0.02 % nebulizer solution Take 2.5 mLs (0.5 mg total) by nebulization every 6 (  six) hours.  125 mL  5  . losartan (COZAAR) 50 MG tablet Take 1 tablet (50 mg total) by mouth daily. Hold for sbp <  Or = 100  30 tablet  5  . morphine (MSIR) 15 MG tablet Take 1 tablet (15 mg total) by mouth 3 (three) times daily.  45 tablet  0  . morphine (ROXANOL) 20 MG/ML concentrated solution Take 0.5 mLs (10 mg total) by mouth every 2 (two) hours as needed for pain (or shortness of breath). Need prefilled syringes 0.5 ml.  30 mL  0  . omeprazole (PRILOSEC) 20 MG capsule Take 1 capsule (20 mg total) by mouth 2 (two) times daily before a meal.  60 capsule  5  . polyethylene glycol (MIRALAX / GLYCOLAX) packet Take 17 g by mouth daily as needed (for constipation).      . potassium chloride (KLOR-CON M10) 10 MEQ tablet Take 1 tablet (10 mEq total) by mouth daily.  30 tablet  5  . predniSONE (DELTASONE) 10 MG tablet Take 2 tablets (20 mg total) by mouth daily.  10 tablet  0  . prochlorperazine (COMPAZINE) 10 MG tablet Take 10 mg by mouth every 6 (six) hours as needed (for nausea/vomiting).      . simethicone (MYLICON) 80 MG chewable tablet Chew 80 mg by mouth every 4 (four) hours as needed for flatulence.      . sodium chloride (OCEAN) 0.65 % nasal spray Place 1 spray into the nose as needed for congestion.      [name] Spacer/Aero-Holding Chambers (AEROCHAMBER MV) inhaler Use as instructed  1 each  0   No facility-administered medications prior to visit.    Review of Systems:   GEN: No fevers, chills. Nontoxic. Primarily MSK c/o today. MSK: Detailed in the HPI GI: tolerating PO  intake without difficulty Neuro: No numbness, parasthesias, or tingling associated. Otherwise the pertinent positives of the ROS are noted above.    Physical Examination: BP 120/70  Pulse 114  Temp(Src) 98.2 F (36.8 C) (Oral)  Ht 5' (1.524 m)  Wt 113 lb (51.256 kg)  BMI 22.07 kg/m2  SpO2 90%  Ideal Body Weight: Weight in (lb) to have BMI = 25: 127.7   GEN: WDWN, NAD, Non-toxic, Alert & Oriented x 3 HEENT: Atraumatic, Normocephalic.  Ears and Nose: No external deformity. NEURO: in wheelchair  PSYCH: Normally interactive. Conversant. Not depressed or anxious appearing.  Calm demeanor.   L Foot and Ankle: NT at tibia and fibular diffusely. NT at malleoli. Talus is markedly tender to palpation. NT at ATFL, CFL, and Deltoid ligaments. Kleiger NT. NT at navicular and cuboid. Ant drawer is negative and subtalar tilt is negative.   All toes and 1st MT shaft are NT.  Tender at 2-4 MT shafts more than at 5th MT. Also tender less so at 5th MT base.   Dg Ankle Complete Left  12/29/2012   *RADIOLOGY REPORT*  Clinical Data: Pain post injury  LEFT ANKLE COMPLETE - 3+ VIEW  Comparison: Left foot same day  Findings: Three views of the left ankle submitted.  No acute fracture or subluxation.  Mild soft tissue swelling. Nondisplaced fracture at the base of fifth metatarsal.  IMPRESSION: No ankle fracture or subluxation.  Mild soft tissue swelling.  Nondisplaced fracture at the base of fifth metatarsal.   Original Report Authenticated By: Natasha Mead, M.D.   Dg Foot Complete Left  12/29/2012   *RADIOLOGY REPORT*  Clinical Data: Trauma, mid foot pain  LEFT  FOOT - COMPLETE 3+ VIEW  Comparison: 12/06/2012  Findings: Three views of the left foot submitted. There is nondisplaced fracture at the base of the fifth metatarsal.  IMPRESSION: Nondisplaced fracture at the base of fifth metatarsal best seen on lateral view.   Original Report Authenticated By: Natasha Mead, M.D.   Dg Foot Complete Left  12/06/2012    *RADIOLOGY REPORT*  Clinical Data: 79 year old female with left foot bruising of unknown etiology.  LEFT FOOT - COMPLETE 3+ VIEW  Comparison: None.  Findings: Hallux valgus and metatarsus primus varus.  Calcaneus intact.  There is some calcified atherosclerosis about the ankle. No acute fracture or dislocation.  No subcutaneous gas.  IMPRESSION: No acute osseous abnormality identified about the left foot.   Original Report Authenticated By: Erskine Speed, M.D.    Assessment and Plan:  Fracture of 5th metatarsal, left, closed, initial encounter  Left foot pain - Plan: DG Foot Complete Left  Left ankle pain - Plan: DG Ankle Complete Left   Base of 5th MT fx, nondisplaced, likely from prior fall.  Suspect stress reaction equivalent 2-4 MT shafts  Place in a tall Aircast fracture boot. Partial weightbearing. Use walker or wheelchair at all times. Patient placed in a pneumatic compression-type CAM, which has been shown to decrease overall time of rehab, swelling, and faster return to full function.   Recheck 4 weeks  Orders Today:  Orders Placed This Encounter  Procedures  . DG Foot Complete Left    Standing Status: Future     Number of Occurrences: 1     Standing Expiration Date: 02/28/2014    Order Specific Question:  Preferred imaging location?    Answer:  [facility]    Order Specific Question:  Reason for exam:    Answer:  trauma 2 weeks ago, MT and midfoot pain  . DG Ankle Complete Left    Standing Status: Future     Number of Occurrences: 1     Standing Expiration Date: 03/01/2014    Order Specific Question:  Reason for Exam (SYMPTOM  OR DIAGNOSIS REQUIRED)    Answer:  pain at talus on palpation    Order Specific Question:  Preferred imaging location?    Answer:  [facility]    Updated Medication List: (Includes new medications, updates to list, dose adjustments) No orders of the defined types were placed in this encounter.    Medications  Discontinued: There are no discontinued medications.    Signed, Elpidio Galea. Marcha Licklider, MD 12/29/2012 10:49 AM

## 2012-12-30 ENCOUNTER — Other Ambulatory Visit: Payer: Self-pay

## 2012-12-30 MED ORDER — MORPHINE SULFATE 15 MG PO TABS: 15.0000 mg | ORAL_TABLET | Freq: Three times a day (TID) | ORAL | Status: DC

## 2012-12-30 NOTE — Telephone Encounter (Signed)
Received call from [facility] pharmacy ( a [facility].); Megan request morphine rx faxed to [phone].

## 2012-12-30 NOTE — Telephone Encounter (Signed)
Script faxed to [place], advised Clydie Braun at [facility], called Megan at [place] and told her I had faxed it. I faxed it again, when she said she had not yet received it.

## 2012-12-30 NOTE — Telephone Encounter (Signed)
[name] with [facility] request rx Morphine 15 mg to [facility]; pt is hospice pt. PT WILL BE OUT OF MED TODAY. [name] request cb when done.

## 2012-12-30 NOTE — Telephone Encounter (Signed)
[name] with [facility] pharmacy left v/m request faxed rx for morphine to [phone].

## 2013-01-06 ENCOUNTER — Other Ambulatory Visit: Payer: Self-pay | Admitting: *Deleted

## 2013-01-06 MED ORDER — IPRATROPIUM BROMIDE 0.02 % IN SOLN: 0.5000 mg | Freq: Four times a day (QID) | RESPIRATORY_TRACT | Status: DC

## 2013-01-11 ENCOUNTER — Other Ambulatory Visit: Payer: Self-pay | Admitting: *Deleted

## 2013-01-11 MED ORDER — MAGIC MOUTHWASH W/LIDOCAINE: 5.0000 mL | Freq: Four times a day (QID) | ORAL | Status: AC | PRN

## 2013-01-13 ENCOUNTER — Other Ambulatory Visit: Payer: Self-pay | Admitting: *Deleted

## 2013-01-13 MED ORDER — PREDNISONE 10 MG PO TABS: ORAL_TABLET | ORAL | Status: AC

## 2013-01-13 MED ORDER — PREDNISONE 10 MG PO TABS: 20.0000 mg | ORAL_TABLET | Freq: Every day | ORAL | Status: DC

## 2013-01-13 NOTE — Addendum Note (Signed)
Addended by: Eliezer Bottom on: 01/13/2013 02:27 PM   Modules accepted: Orders

## 2013-01-13 NOTE — Telephone Encounter (Signed)
Also, Tillman at [facility] is asking for script for morphine 15 mg's, one tablet TID.

## 2013-01-13 NOTE — Telephone Encounter (Signed)
Rx writtent and on my desk.  Ok to change prednisone rx as requested.

## 2013-01-13 NOTE — Telephone Encounter (Signed)
Dr Dayton Martes, please review and advise on notes below.

## 2013-01-13 NOTE — Telephone Encounter (Signed)
Faxed refill request from [facility] for prednisone 10 mg's.  Request states to take one by mouth every morning with food, med list has to take 2 a day.  Please advise.

## 2013-01-13 NOTE — Telephone Encounter (Signed)
Prednisone sent to pharmacy, script for morphine [facility]ed [facility].

## 2013-01-24 ENCOUNTER — Other Ambulatory Visit: Payer: Self-pay | Admitting: *Deleted

## 2013-01-24 DIAGNOSIS — I714 Abdominal aortic aneurysm, without rupture: Secondary | ICD-10-CM

## 2013-01-24 DIAGNOSIS — Z48812 Encounter for surgical aftercare following surgery on the circulatory system: Secondary | ICD-10-CM

## 2013-01-27 DIAGNOSIS — J9620 Acute and chronic respiratory failure, unspecified whether with hypoxia or hypercapnia: Secondary | ICD-10-CM

## 2013-01-27 DIAGNOSIS — J449 Chronic obstructive pulmonary disease, unspecified: Secondary | ICD-10-CM

## 2013-01-27 DIAGNOSIS — G9340 Encephalopathy, unspecified: Secondary | ICD-10-CM

## 2013-01-27 DIAGNOSIS — A419 Sepsis, unspecified organism: Secondary | ICD-10-CM

## 2013-01-28 ENCOUNTER — Ambulatory Visit: Payer: Medicare Other | Admitting: Family Medicine

## 2013-01-31 ENCOUNTER — Encounter: Payer: Self-pay | Admitting: Family Medicine

## 2013-01-31 ENCOUNTER — Other Ambulatory Visit: Payer: Self-pay | Admitting: Family Medicine

## 2013-01-31 ENCOUNTER — Ambulatory Visit (INDEPENDENT_AMBULATORY_CARE_PROVIDER_SITE_OTHER)
Admission: RE | Admit: 2013-01-31 | Discharge: 2013-01-31 | Disposition: A | Discharge status: Home / Self Care | Payer: Medicare Other | Source: Ambulatory Visit | Attending: Family Medicine | Admitting: Family Medicine

## 2013-01-31 ENCOUNTER — Ambulatory Visit (INDEPENDENT_AMBULATORY_CARE_PROVIDER_SITE_OTHER): Payer: Medicare Other | Admitting: Family Medicine

## 2013-01-31 ENCOUNTER — Other Ambulatory Visit: Payer: Self-pay | Admitting: *Deleted

## 2013-01-31 VITALS — BP 120/70 | HR 88 | Temp 98.2°F | Ht 60.0 in | Wt 112.8 lb

## 2013-01-31 DIAGNOSIS — S92309A Fracture of unspecified metatarsal bone(s), unspecified foot, initial encounter for closed fracture: Secondary | ICD-10-CM

## 2013-01-31 DIAGNOSIS — S92302A Fracture of unspecified metatarsal bone(s), left foot, initial encounter for closed fracture: Secondary | ICD-10-CM

## 2013-01-31 MED ORDER — ALPRAZOLAM 1 MG PO TABS: ORAL_TABLET | ORAL | Status: DC

## 2013-01-31 MED ORDER — MORPHINE SULFATE 15 MG PO TABS: 15.0000 mg | ORAL_TABLET | Freq: Three times a day (TID) | ORAL | Status: DC

## 2013-01-31 NOTE — Telephone Encounter (Signed)
Faxed refill request for xanax.  Please advise

## 2013-01-31 NOTE — Telephone Encounter (Signed)
Script faxed.

## 2013-01-31 NOTE — Telephone Encounter (Signed)
[name] left v/m requesting morphine rx faxed to [facility]; spoke with [name] and rx was received.

## 2013-02-01 NOTE — Progress Notes (Signed)
Nature conservation officer at [facility] [address] [place] [address] Phone: [phone] Fax: [phone]  Date:  01/31/2013   Name:  [name]   DOB:  02-19-1931   MRN:  [id number] Gender: female Age: 79 y.o.  Primary Physician:  [name], MD  Evaluating MD: Hannah Beat, MD  Chief Complaint: Follow-up   History of Present Illness:  [name] is a 79 y.o. very pleasant female patient who presents with the following:  F/u 5th MT fx of base, patient has been compliant in her fracture boot using walker and wheelchair intermittently. No pain now. She has tried walking on it for no pain.  Past Medical History, Surgical History, Social History, Family History, Problem List, Medications, and Allergies have been reviewed and updated if relevant.  Current Outpatient Prescriptions on File Prior to Visit  Medication Sig Dispense Refill  . acetaminophen (TYLENOL) 325 MG tablet Take 650 mg by mouth every 6 (six) hours as needed for pain or fever.      [name] albuterol (PROVENTIL) (2.5 MG/3ML) 0.083% nebulizer solution Take 3 mLs (2.5 mg total) by nebulization 4 (four) times daily.  150 mL  5  . albuterol (PROVENTIL) (2.5 MG/3ML) 0.083% nebulizer solution Take 2.5 mg by nebulization every 6 (six) hours as needed for wheezing or shortness of breath.      . Alum & Mag Hydroxide-Simeth (MAGIC MOUTHWASH W/LIDOCAINE) SOLN Take 5 mLs by mouth 4 (four) times daily as needed (for mouth pain).  120 mL  0  . antiseptic oral rinse (BIOTENE) LIQD 15 mLs by Mouth Rinse route as needed (for antiseptic).      . budesonide (PULMICORT) 0.25 MG/2ML nebulizer solution Take 2 mLs (0.25 mg total) by nebulization 2 (two) times daily.  60 mL  5  . chlorpheniramine-HYDROcodone (TUSSIONEX) 10-8 MG/5ML LQCR Take 5 mLs by mouth every 12 (twelve) hours as needed (for cough).      . docusate sodium (COLACE) 100 MG capsule Take 100 mg by mouth 2 (two) times daily.      . furosemide (LASIX) 20 MG tablet Take 1  tablet (20 mg total) by mouth daily.  30 tablet  5  . ipratropium (ATROVENT) 0.02 % nebulizer solution Take 2.5 mLs (0.5 mg total) by nebulization every 6 (six) hours.  125 mL  5  . losartan (COZAAR) 50 MG tablet Take 1 tablet (50 mg total) by mouth daily. Hold for sbp <  Or = 100  30 tablet  5  . morphine (ROXANOL) 20 MG/ML concentrated solution Take 0.5 mLs (10 mg total) by mouth every 2 (two) hours as needed for pain (or shortness of breath). Need prefilled syringes 0.5 ml.  30 mL  0  . omeprazole (PRILOSEC) 20 MG capsule Take 1 capsule (20 mg total) by mouth 2 (two) times daily before a meal.  60 capsule  5  . polyethylene glycol (MIRALAX / GLYCOLAX) packet Take 17 g by mouth daily as needed (for constipation).      . potassium chloride (KLOR-CON M10) 10 MEQ tablet Take 1 tablet (10 mEq total) by mouth daily.  30 tablet  5  . predniSONE (DELTASONE) 10 MG tablet Take one by mouth every morning with food.  15 tablet  5  . prochlorperazine (COMPAZINE) 10 MG tablet Take 10 mg by mouth every 6 (six) hours as needed (for nausea/vomiting).      . simethicone (MYLICON) 80 MG chewable tablet Chew 80 mg by mouth every 4 (four)  hours as needed for flatulence.      . sodium chloride (OCEAN) 0.65 % nasal spray Place 1 spray into the nose as needed for congestion.      [name] Spacer/Aero-Holding Chambers (AEROCHAMBER MV) inhaler Use as instructed  1 each  0   No current facility-administered medications on file prior to visit.    Review of Systems:  GEN: No fevers, chills. Nontoxic. Primarily MSK c/o today. MSK: Detailed in the HPI GI: tolerating PO intake without difficulty Neuro: No numbness, parasthesias, or tingling associated. Otherwise the pertinent positives of the ROS are noted above.    Physical Examination: BP 120/70  Pulse 88  Temp(Src) 98.2 F (36.8 C) (Oral)  Ht 5' (1.524 m)  Wt 112 lb 12 oz (51.143 kg)  BMI 22.02 kg/m2   GEN: WDWN, NAD, Non-toxic, Alert & Oriented x 3 HEENT:  Atraumatic, Normocephalic.  Ears and Nose: No external deformity. EXTR: No clubbing/cyanosis/edema PSYCH: Normally interactive. Conversant. Not depressed or anxious appearing.  Calm demeanor.  L Foot: NT throughout entirety of forefoot. NT 5th MT. All bony anatomy NT.  Dg Foot Complete Left  01/31/2013   *RADIOLOGY REPORT*  Clinical Data: 79 year old female with fifth metatarsal fracture.  LEFT FOOT - COMPLETE 3+ VIEW  Comparison: 12/29/2012.  Findings: Decreased conspicuity of the transverse fracture at the base of the fifth metatarsal mild increase sclerosis noted at the proximal fifth metatarsal.  Osteopenia elsewhere.  Healed fifth proximal phalanx fracture is stable.  Calcaneus intact.  No new osseous abnormality.  IMPRESSION: Interval healing of the base of the fifth metatarsal fracture such that the fracture plane is no longer visible.  No new osseous abnormality.   Original Report Authenticated By: Erskine Speed, M.D.    Assessment and Plan: Fracture of 5th metatarsal, left, closed, initial encounter - Plan: DG Foot Complete Left   Wean out of CAM over next 4-5 days, f/u prn  Signed, Tessla Spurling T. [place] Lesure, MD 02/01/2013 9:28 AM

## 2013-02-03 ENCOUNTER — Other Ambulatory Visit: Payer: Self-pay | Admitting: Family Medicine

## 2013-02-03 MED ORDER — ALPRAZOLAM 1 MG PO TABS: ORAL_TABLET | ORAL | Status: DC

## 2013-02-07 NOTE — Progress Notes (Signed)
This encounter was created in error - please disregard.

## 2013-02-08 ENCOUNTER — Encounter: Payer: Self-pay | Admitting: Critical Care Medicine

## 2013-02-08 ENCOUNTER — Ambulatory Visit (INDEPENDENT_AMBULATORY_CARE_PROVIDER_SITE_OTHER): Admitting: Critical Care Medicine

## 2013-02-08 VITALS — BP 114/54 | HR 82 | Temp 98.1°F | Ht 60.0 in | Wt 115.2 lb

## 2013-02-08 DIAGNOSIS — J449 Chronic obstructive pulmonary disease, unspecified: Secondary | ICD-10-CM

## 2013-02-08 DIAGNOSIS — J9610 Chronic respiratory failure, unspecified whether with hypoxia or hypercapnia: Secondary | ICD-10-CM

## 2013-02-08 DIAGNOSIS — Z66 Do not resuscitate: Secondary | ICD-10-CM

## 2013-02-08 NOTE — Assessment & Plan Note (Addendum)
Gold stage D. COPD with asthmatic bronchitic and emphysematous components and associated chronic respiratory failure The patient maintains hospice Plan Maintain hospice Maintain oxygen therapy Maintain nebulized therapy Maintain Xanax and morphine for symptom relief

## 2013-02-08 NOTE — Progress Notes (Signed)
Subjective:    Patient ID: [name], female    DOB: 26-Nov-1930, 79 y.o.   MRN: [id number]  HPI  79 y.o. . WF Copd Gold D Oxygen dependent.  Adm 2/13 , 11/13 , and 05/20/12 , 06/2012 , 2/ 2014, 10/2012   02/08/2013 Chief Complaint  Patient presents with  . 2 month follow up    Has good days and bad days.  Has SOB, chest tightness, and occas cough - nonprod.    Now at [place].  Now good and bad days CAT Score 02/08/2013 08/27/2012 06/01/2012  Total CAT Score 30 27 21   [facility] sees the pt and helps.     Past Medical History  Diagnosis Date  . History of colonic polyps   . COPD (chronic obstructive pulmonary disease)   . Diverticulitis of colon   . GERD (gastroesophageal reflux disease)   . HTN (hypertension)   . Osteoarthritis   . Hyperlipemia   . Aortic aneurysm, abdominal   . Ovarian cyst, left   . Psoriasis   . Chronic respiratory failure     Uses home oxygen at 2 L  . ADRENAL MASS 05/04/2007  . GLAUCOMA 03/30/2007  . HEMORRHOIDS, INTERNAL 03/23/2008  . OSTEOPOROSIS 03/30/2007  . SCOLIOSIS 03/30/2007  . PONV (postoperative nausea and vomiting)       Review of Systems   Constitutional:   No  weight loss, night sweats,  Fevers, chills,  +fatigue, or  lassitude.  HEENT:   No headaches,  Difficulty swallowing,  Tooth/dental problems, or  Sore throat,                No sneezing, itching, ear ache, nasal congestion, post nasal drip,   CV:  No chest pain,  Orthopnea, PND, swelling in lower extremities, anasarca, dizziness, palpitations, syncope.   GI  No bloody stools.   Resp:   No coughing up of blood.  [name]  No chest wall deformity  Skin: no rash or lesions.  GU: no dysuria, change in color of urine, no urgency or frequency.  No flank pain, no hematuria   MS:  No joint pain or swelling.  No decreased range of motion.  No back pain.  Psych:  No change in mood or affect. No depression or anxiety.  No memory loss.    Objective:   Physical  Exam  Filed Vitals:   02/08/13 1014  BP: 114/54  Pulse: 82  Temp: 98.1 F (36.7 C)  TempSrc: Oral  Height: 5' (1.524 m)  Weight: 115 lb 3.2 oz (52.254 kg)  SpO2: 91%    Gen: Pleasant, well-nourished, in no distress, anxious, +wheelchair, O2 3L  ENT: No lesions,  mouth clear,  oropharynx clear, no postnasal drip  Neck: +1 JVD, no TMG, no carotid bruits  Lungs: No use of accessory muscles, no dullness to percussion, distant BS, poor air flow  Cardiovascular: RRR, heart sounds normal, no murmur or gallops, no peripheral edema  Abdomen: soft and NT, no HSM,  BS normal  Musculoskeletal: No deformities, no cyanosis or clubbing. + Edema L>R foot  Neuro: alert, non focal  Skin: Warm, no lesions or rashes  No results found. Cleda Daub 02/08/2013: FeV1 30% FeV1/FVC 39%    Assessment & Plan:   COPD (chronic obstructive pulmonary disease), Gold D  Gold stage D. COPD with asthmatic bronchitic and emphysematous components and associated chronic respiratory failure The patient maintains hospice Plan Maintain hospice Maintain oxygen therapy Maintain nebulized therapy Maintain Xanax and morphine  for symptom relief   Chronic respiratory failure Chronic respiratory failure on home oxygen therapy  DNR (do not resuscitate) The patient is an established DO NOT RESUSCITATE    Updated Medication List Outpatient Encounter Prescriptions as of 02/08/2013  Medication Sig Dispense Refill  . acetaminophen (TYLENOL) 325 MG tablet Take 650 mg by mouth every 6 (six) hours as needed for pain or fever.      [name] albuterol (PROVENTIL) (2.5 MG/3ML) 0.083% nebulizer solution Take 2.5 mg by nebulization every 2 (two) hours as needed for wheezing or shortness of breath.       [name] albuterol (PROVENTIL) (2.5 MG/3ML) 0.083% nebulizer solution Take 2.5 mg by nebulization 4 (four) times daily. And every 2 hours as needed for wheezing or SOB      . ALPRAZolam (XANAX) 1 MG tablet Take by mouth. Take 1 mg at 8 am  and 2 pm and 1.5 mg at bedtime      . Alum & Mag Hydroxide-Simeth (MAGIC MOUTHWASH W/LIDOCAINE) SOLN Take 5 mLs by mouth 4 (four) times daily as needed (for mouth pain).  120 mL  0  . antiseptic oral rinse (BIOTENE) LIQD 15 mLs by Mouth Rinse route as needed (for antiseptic).      . benzonatate (TESSALON) 100 MG capsule Take 100 mg by mouth 3 (three) times daily as needed for cough.      . budesonide (PULMICORT) 0.25 MG/2ML nebulizer solution Take 2 mLs (0.25 mg total) by nebulization 2 (two) times daily.  60 mL  5  . chlorpheniramine-HYDROcodone (TUSSIONEX) 10-8 MG/5ML LQCR Take 5 mLs by mouth every 12 (twelve) hours as needed (for cough).      . docusate sodium (COLACE) 100 MG capsule Take 100 mg by mouth 2 (two) times daily.      . fluticasone (FLONASE) 50 MCG/ACT nasal spray Place 1 spray into the nose daily as needed.      . furosemide (LASIX) 20 MG tablet Take 1 tablet (20 mg total) by mouth daily.  30 tablet  5  . ipratropium (ATROVENT) 0.02 % nebulizer solution Take 2.5 mLs (0.5 mg total) by nebulization every 6 (six) hours.  125 mL  5  . losartan (COZAAR) 50 MG tablet Take 1 tablet (50 mg total) by mouth daily. Hold for sbp <  Or = 100  30 tablet  5  . morphine (MSIR) 15 MG tablet Take 1 tablet (15 mg total) by mouth 3 (three) times daily.  90 tablet  0  . morphine (ROXANOL) 20 MG/ML concentrated solution Take 0.5 mLs (10 mg total) by mouth every 2 (two) hours as needed for pain (or shortness of breath). Need prefilled syringes 0.5 ml.  30 mL  0  . NON FORMULARY Oxygen 3 liters 24/7      . omeprazole (PRILOSEC) 20 MG capsule Take 1 capsule (20 mg total) by mouth 2 (two) times daily before a meal.  60 capsule  5  . polyethylene glycol (MIRALAX / GLYCOLAX) packet Take 17 g by mouth daily as needed (for constipation).      . potassium chloride (KLOR-CON M10) 10 MEQ tablet Take 1 tablet (10 mEq total) by mouth daily.  30 tablet  5  . predniSONE (DELTASONE) 10 MG tablet Take one by mouth every  morning with food.  15 tablet  5  . prochlorperazine (COMPAZINE) 10 MG tablet Take 10 mg by mouth every 6 (six) hours as needed (for nausea/vomiting).      . simethicone (MYLICON) 80 MG  chewable tablet Chew 80 mg by mouth every 4 (four) hours as needed for flatulence.      . sodium chloride (OCEAN) 0.65 % nasal spray Place 1 spray into the nose as needed for congestion.      . [DISCONTINUED] albuterol (PROVENTIL) (2.5 MG/3ML) 0.083% nebulizer solution Take 3 mLs (2.5 mg total) by nebulization 4 (four) times daily.  150 mL  5  . [DISCONTINUED] ALPRAZolam (XANAX) 1 MG tablet 1 tab bid, 1.5 tabs qhs  60 tablet  0  . [DISCONTINUED] Spacer/Aero-Holding Chambers (AEROCHAMBER MV) inhaler Use as instructed  1 each  0   No facility-administered encounter medications on file as of 02/08/2013.

## 2013-02-08 NOTE — Assessment & Plan Note (Signed)
Chronic respiratory failure on home oxygen therapy

## 2013-02-08 NOTE — Assessment & Plan Note (Signed)
The patient is an established DO NOT RESUSCITATE

## 2013-02-08 NOTE — Patient Instructions (Addendum)
No change in medications. Return in        2 months 

## 2013-02-16 ENCOUNTER — Telehealth: Payer: Self-pay | Admitting: *Deleted

## 2013-02-16 MED ORDER — MORPHINE SULFATE 15 MG PO TABS: 15.0000 mg | ORAL_TABLET | Freq: Three times a day (TID) | ORAL | Status: DC

## 2013-02-16 NOTE — Telephone Encounter (Signed)
[employer] has faxed request for new hard script for morphine 15 mg's one tid. Script is on your desk.

## 2013-02-17 NOTE — Telephone Encounter (Signed)
Script faxed to [facility]. 

## 2013-02-17 NOTE — Telephone Encounter (Signed)
Rx signed and on my desk

## 2013-03-01 ENCOUNTER — Ambulatory Visit: Payer: Medicare HMO | Admitting: Vascular Surgery

## 2013-03-03 ENCOUNTER — Other Ambulatory Visit: Payer: Self-pay | Admitting: Family Medicine

## 2013-03-03 MED ORDER — ALPRAZOLAM 1 MG PO TABS: ORAL_TABLET | ORAL | Status: DC

## 2013-03-04 ENCOUNTER — Other Ambulatory Visit: Payer: Self-pay | Admitting: *Deleted

## 2013-03-04 MED ORDER — MORPHINE SULFATE 15 MG PO TABS: 15.0000 mg | ORAL_TABLET | Freq: Three times a day (TID) | ORAL | Status: DC

## 2013-03-04 NOTE — Telephone Encounter (Signed)
Script faxed to [facility].

## 2013-03-04 NOTE — Telephone Encounter (Signed)
Tech from [facility] request a new hardcopy rx for pt's morphine 15mg .

## 2013-03-07 ENCOUNTER — Encounter: Payer: Self-pay | Admitting: Vascular Surgery

## 2013-03-08 ENCOUNTER — Inpatient Hospital Stay: Admission: RE | Admit: 2013-03-08 | Payer: Medicare Other | Source: Ambulatory Visit

## 2013-03-08 ENCOUNTER — Ambulatory Visit: Payer: Self-pay | Admitting: Vascular Surgery

## 2013-03-08 ENCOUNTER — Telehealth: Payer: Self-pay | Admitting: Vascular Surgery

## 2013-03-08 NOTE — Telephone Encounter (Signed)
Patients son called to get appointment for CTA and [name] "straightened out". We apparently had the incorrect contact information for [name]. She lives at [facility]. She requires pre-med for contrast allergy and also needed bloodwork prior to her CTA.  I have arranged for BUN and Creatinie to be drawn by Hospice nurse today- order was faxed to [phone]. I have also faxed pre-med prescription to [phone] as directed by Nursing station.  All of the patients information has been updated in EPIC with correct phone #s and emergency contacts. Apparently the CT was rescheduled at [facility] for this Friday 09/26 and so, I rescheduled [name]'s appointment to 03/22/2013. All of the above information was relayed to patients son. He voiced understanding. I apologized for any inconvenience and gave him my direct # for further issues or assistance. dpm

## 2013-03-09 ENCOUNTER — Other Ambulatory Visit: Payer: Self-pay

## 2013-03-09 MED ORDER — MORPHINE SULFATE (CONCENTRATE) 20 MG/ML PO SOLN: 10.0000 mg | ORAL | Status: DC | PRN

## 2013-03-09 NOTE — Telephone Encounter (Signed)
Rx refilled and on my desk.

## 2013-03-09 NOTE — Telephone Encounter (Signed)
[name] at [facility] left v/m requesting rx Morphine 20 mg/ml faxed to [facility] [phone]. Pt has one left.Please advise.

## 2013-03-09 NOTE — Telephone Encounter (Signed)
Rx faxed as directed. [name] notified.

## 2013-03-11 ENCOUNTER — Ambulatory Visit
Admission: RE | Admit: 2013-03-11 | Discharge: 2013-03-11 | Disposition: A | Discharge status: Home / Self Care | Payer: Medicare Other | Source: Ambulatory Visit | Attending: Vascular Surgery | Admitting: Vascular Surgery

## 2013-03-11 DIAGNOSIS — I714 Abdominal aortic aneurysm, without rupture: Secondary | ICD-10-CM

## 2013-03-11 DIAGNOSIS — Z48812 Encounter for surgical aftercare following surgery on the circulatory system: Secondary | ICD-10-CM

## 2013-03-11 MED ORDER — IOHEXOL 350 MG/ML SOLN
80.0000 mL | Freq: Once | INTRAVENOUS | Status: AC | PRN
  Administered 2013-03-11: 80 mL via INTRAVENOUS

## 2013-03-14 ENCOUNTER — Telehealth: Payer: Self-pay

## 2013-03-14 MED ORDER — MIRTAZAPINE 15 MG PO TABS: 15.0000 mg | ORAL_TABLET | Freq: Every day | ORAL | Status: AC

## 2013-03-14 NOTE — Telephone Encounter (Signed)
Rx faxed as instructed. Cassie notified by telephone.

## 2013-03-14 NOTE — Telephone Encounter (Signed)
[name] with [facility] left v/m; pt not sleeping or resting at night, more anxiety. Request fax order to [facility] assisted living fax # [phone] for Remeron 15 mg to take at hs . [name] can be reached at [phone]. Pt is a hospice pt.

## 2013-03-14 NOTE — Telephone Encounter (Signed)
Please fax as rx printed.

## 2013-03-15 ENCOUNTER — Ambulatory Visit: Payer: Self-pay | Admitting: Vascular Surgery

## 2013-03-15 ENCOUNTER — Other Ambulatory Visit (HOSPITAL_COMMUNITY): Payer: Medicare Other

## 2013-03-21 ENCOUNTER — Telehealth: Payer: Self-pay | Admitting: Family Medicine

## 2013-03-21 ENCOUNTER — Encounter: Payer: Self-pay | Admitting: Vascular Surgery

## 2013-03-21 NOTE — Telephone Encounter (Signed)
[facility] Triage Call Report Triage Record Num: [id number] Operator: Donna Bernard Patient Name: [name] Call Date & Time: 03/19/2013 4:38:07PM Patient Phone: [phone] PCP: [name] Caller Name: Arelia Sneddon Relationship to Patient: Unknown Patient Gender: Female PCP Fax : [phone] Patient DOB: [birth year] Practice Name: [facility] Reason for Call: Caller: [place]/Med Tech; PCP: [name] (Family Practice); CB#: [phone]; Call regarding Hard scripts needed for Xanax; s/w [place] concerning patient needs hard scripts for Xanax 1 mg po BID and Xanax 1.5 mg po at hs (has 1.5 mg hs dose on hand for 03/19/2013 PM and one 1 mg dose for 03/20/2013 AM); emergent sxs r/o per "Medication Questions - Adult" protocol except "Requests refill of prescribed medication with valid refills; lack of medications does not put patient at clinical risk" positive with disposition of "Speak with Provider or Pharmacy within 24 hours"; contacted o/c provider and Dr. Para March unable to provide hard scripts this weekend but will speak with the pharmacist directly to give a verbal order; called [facility], s/w [facility]) and the pharmacist will call Dr. Para March directly for verbal order; called facility and informed [name] of this. Protocol(s) Used: Medication Questions - Adult Recommended Outcome per Protocol: Call Dispensing Pharmacy or Provider Immediately Reason for Outcome: Requests refill of medication that poses clinical risk to patient if not available within 8 hours Care Advice: ~ 03/19/2013 5:41:12PM Page 1 of 1 [email or web address]

## 2013-03-21 NOTE — Telephone Encounter (Signed)
Please call pt's nursing home to see if she [facility] need to send rx.

## 2013-03-22 ENCOUNTER — Ambulatory Visit (INDEPENDENT_AMBULATORY_CARE_PROVIDER_SITE_OTHER): Admitting: Vascular Surgery

## 2013-03-22 ENCOUNTER — Encounter: Payer: Self-pay | Admitting: Vascular Surgery

## 2013-03-22 VITALS — BP 174/80 | HR 93 | Resp 20 | Ht 60.0 in | Wt 120.0 lb

## 2013-03-22 DIAGNOSIS — I714 Abdominal aortic aneurysm, without rupture: Secondary | ICD-10-CM

## 2013-03-22 NOTE — Progress Notes (Signed)
Subjective:     Patient ID: [name], female   DOB: Sep 27, 1930, 79 y.o.   MRN: [id number]  HPI this 79 year old female returns for continued followup regarding her aortic stent graft-Excluder-which was inserted by me in 2010. Patient also has a known suprarenal aortic aneurysm greater than 4 cm. She has severe COPD and is on chronic oxygen therapy at home with frequent dyspnea on exertion. She also lost her husband recently and is depressed regarding that. She denies any abdominal or back pain.  Past Medical History  Diagnosis Date  . History of colonic polyps   . COPD (chronic obstructive pulmonary disease)   . Diverticulitis of colon   . GERD (gastroesophageal reflux disease)   . HTN (hypertension)   . Osteoarthritis   . Hyperlipemia   . Aortic aneurysm, abdominal   . Ovarian cyst, left   . Psoriasis   . Chronic respiratory failure     Uses home oxygen at 2 L  . ADRENAL MASS 05/04/2007  . GLAUCOMA 03/30/2007  . HEMORRHOIDS, INTERNAL 03/23/2008  . OSTEOPOROSIS 03/30/2007  . SCOLIOSIS 03/30/2007  . PONV (postoperative nausea and vomiting)     History  Substance Use Topics  . Smoking status: Former Smoker -- 0.50 packs/day for 40 years    Types: Cigarettes    Quit date: 06/16/2009  . Smokeless tobacco: Never Used  . Alcohol Use: No    Family History  Problem Relation Age of Onset  . Heart attack Mother   . Heart attack Father   . Deep vein thrombosis Father   . Cancer Brother     Pancreas and colon    Allergies  Allergen Reactions  . Clarithromycin     REACTION: abd pain  . Codeine   . Hydrocodone-Guaifenesin     REACTION: feels funny  . Iohexol      Desc: pt reports dyspnea and throat swelling about age 87 w/ IV contrast reaction when kidneys were being checked; 07/25/08 SLG   . Moxifloxacin     REACTION: hallucinations  . Penicillins   . Prednisone Other (See Comments)    Can take but at a lower dosage  . Simvastatin   . Sulfonamide Derivatives    REACTION: unknown    Current outpatient prescriptions:acetaminophen (TYLENOL) 325 MG tablet, Take 650 mg by mouth every 6 (six) hours as needed for pain or fever., Disp: , Rfl: ;  albuterol (PROVENTIL) (2.5 MG/3ML) 0.083% nebulizer solution, Take 2.5 mg by nebulization every 2 (two) hours as needed for wheezing or shortness of breath. , Disp: , Rfl:  albuterol (PROVENTIL) (2.5 MG/3ML) 0.083% nebulizer solution, Take 2.5 mg by nebulization 4 (four) times daily. And every 2 hours as needed for wheezing or SOB, Disp: , Rfl: ;  ALPRAZolam (XANAX) 1 MG tablet, Take 1 mg at 8 am and 2 pm and 1.5 mg at bedtime, Disp: 90 tablet, Rfl: 0;  Alum & Mag Hydroxide-Simeth (MAGIC MOUTHWASH W/LIDOCAINE) SOLN, Take 5 mLs by mouth 4 (four) times daily as needed (for mouth pain)., Disp: 120 mL, Rfl: 0 antiseptic oral rinse (BIOTENE) LIQD, 15 mLs by Mouth Rinse route as needed (for antiseptic)., Disp: , Rfl: ;  benzonatate (TESSALON) 100 MG capsule, Take 100 mg by mouth 3 (three) times daily as needed for cough., Disp: , Rfl: ;  budesonide (PULMICORT) 0.25 MG/2ML nebulizer solution, Take 2 mLs (0.25 mg total) by nebulization 2 (two) times daily., Disp: 60 mL, Rfl: 5 chlorpheniramine-HYDROcodone (TUSSIONEX) 10-8 MG/5ML LQCR, Take 5 mLs by  mouth every 12 (twelve) hours as needed (for cough)., Disp: , Rfl: ;  docusate sodium (COLACE) 100 MG capsule, Take 100 mg by mouth 2 (two) times daily., Disp: , Rfl: ;  fluticasone (FLONASE) 50 MCG/ACT nasal spray, Place 1 spray into the nose daily as needed., Disp: , Rfl: ;  furosemide (LASIX) 20 MG tablet, Take 1 tablet (20 mg total) by mouth daily., Disp: 30 tablet, Rfl: 5 ipratropium (ATROVENT) 0.02 % nebulizer solution, Take 2.5 mLs (0.5 mg total) by nebulization every 6 (six) hours., Disp: 125 mL, Rfl: 5;  losartan (COZAAR) 50 MG tablet, Take 1 tablet (50 mg total) by mouth daily. Hold for sbp <  Or = 100, Disp: 30 tablet, Rfl: 5;  mirtazapine (REMERON) 15 MG tablet, Take 1 tablet (15 mg  total) by mouth at bedtime., Disp: 30 tablet, Rfl: 3 morphine (MSIR) 15 MG tablet, Take 1 tablet (15 mg total) by mouth 3 (three) times daily., Disp: 90 tablet, Rfl: 0;  morphine (ROXANOL) 20 MG/ML concentrated solution, Take 0.5 mLs (10 mg total) by mouth every 2 (two) hours as needed for pain (or shortness of breath). Need prefilled syringes 0.5 ml., Disp: 30 mL, Rfl: 0;  NON FORMULARY, Oxygen 3 liters 24/7, Disp: , Rfl:  omeprazole (PRILOSEC) 20 MG capsule, Take 1 capsule (20 mg total) by mouth 2 (two) times daily before a meal., Disp: 60 capsule, Rfl: 5;  polyethylene glycol (MIRALAX / GLYCOLAX) packet, Take 17 g by mouth daily as needed (for constipation)., Disp: , Rfl: ;  potassium chloride (KLOR-CON M10) 10 MEQ tablet, Take 1 tablet (10 mEq total) by mouth daily., Disp: 30 tablet, Rfl: 5 predniSONE (DELTASONE) 10 MG tablet, Take one by mouth every morning with food., Disp: 15 tablet, Rfl: 5;  prochlorperazine (COMPAZINE) 10 MG tablet, Take 10 mg by mouth every 6 (six) hours as needed (for nausea/vomiting)., Disp: , Rfl: ;  simethicone (MYLICON) 80 MG chewable tablet, Chew 80 mg by mouth every 4 (four) hours as needed for flatulence., Disp: , Rfl:  sodium chloride (OCEAN) 0.65 % nasal spray, Place 1 spray into the nose as needed for congestion., Disp: , Rfl:   BP 174/80  Pulse 93  Resp 20  Ht 5' (1.524 m)  Wt 120 lb (54.432 kg)  BMI 23.44 kg/m2  Body mass index is 23.44 kg/(m^2).        Review of Systems severe chronic dyspnea on exertion and dyspnea at rest    Objective:   Physical Exam BP 174/80  Pulse 93  Resp 20  Ht 5' (1.524 m)  Wt 120 lb (54.432 kg)  BMI 23.44 kg/m2  Gen. chronically ill-appearing patient on nasal oxygen dyspneic at rest Lungs expiratory wheezing Abdomen firm but nontender 3+ femoral pulses palpable  Today I reviewed the CT angiogram which I ordered of the abdomen and pelvis. This reveals the suprarenal aneurysm greater than 4 cm in diameter to be  stable. The aortic stent graft is in good position with a type II endoleak but no change in the sac diameter     Assessment:     Had discussion with patient and her son today regarding continuing followup. She is not a candidate for any further surgery and has severe COPD on home oxygen. We have agreed that we should discontinue followup with further CT angiograms since she is not a candidate for further intervention or surgery. Patient and son are comfortable with this decision.    Plan:     Return to  see [place] on when necessary basis

## 2013-03-28 ENCOUNTER — Emergency Department: Payer: Self-pay | Admitting: Emergency Medicine

## 2013-03-28 LAB — CBC WITH DIFFERENTIAL/PLATELET
Basophil #: 0.1 10*3/uL (ref 0.0–0.1)
Eosinophil %: 2.6 %
HCT: 31 % — ABNORMAL LOW (ref 35.0–47.0)
HGB: 10.3 g/dL — ABNORMAL LOW (ref 12.0–16.0)
Lymphocyte %: 28.1 %
MCH: 28 pg (ref 26.0–34.0)
MCHC: 33.1 g/dL (ref 32.0–36.0)
MCV: 85 fL (ref 80–100)
Monocyte #: 0.9 x10 3/mm (ref 0.2–0.9)
Monocyte %: 12.5 %
Neutrophil #: 4.2 10*3/uL (ref 1.4–6.5)
Platelet: 208 10*3/uL (ref 150–440)
RBC: 3.67 10*6/uL — ABNORMAL LOW (ref 3.80–5.20)
WBC: 7.5 10*3/uL (ref 3.6–11.0)

## 2013-03-28 LAB — COMPREHENSIVE METABOLIC PANEL
Albumin: 3.3 g/dL — ABNORMAL LOW (ref 3.4–5.0)
Anion Gap: 3 — ABNORMAL LOW (ref 7–16)
Bilirubin,Total: 0.2 mg/dL (ref 0.2–1.0)
Calcium, Total: 8.8 mg/dL (ref 8.5–10.1)
Chloride: 105 mmol/L (ref 98–107)
Creatinine: 1.19 mg/dL (ref 0.60–1.30)
Potassium: 4.1 mmol/L (ref 3.5–5.1)
SGOT(AST): 12 U/L — ABNORMAL LOW (ref 15–37)
Sodium: 142 mmol/L (ref 136–145)
Total Protein: 6.3 g/dL — ABNORMAL LOW (ref 6.4–8.2)

## 2013-03-28 LAB — TROPONIN I: Troponin-I: <0.02 ng/mL

## 2013-03-30 ENCOUNTER — Encounter (HOSPITAL_COMMUNITY): Payer: Self-pay | Admitting: Emergency Medicine

## 2013-03-30 ENCOUNTER — Emergency Department (HOSPITAL_COMMUNITY)
Admission: EM | Admit: 2013-03-30 | Discharge: 2013-03-30 | Disposition: A | Discharge status: Home / Self Care | Attending: Emergency Medicine | Admitting: Emergency Medicine

## 2013-03-30 ENCOUNTER — Ambulatory Visit (HOSPITAL_COMMUNITY)

## 2013-03-30 ENCOUNTER — Emergency Department (HOSPITAL_COMMUNITY)

## 2013-03-30 DIAGNOSIS — M810 Age-related osteoporosis without current pathological fracture: Secondary | ICD-10-CM | POA: Insufficient documentation

## 2013-03-30 DIAGNOSIS — Z9981 Dependence on supplemental oxygen: Secondary | ICD-10-CM | POA: Insufficient documentation

## 2013-03-30 DIAGNOSIS — M4120 Other idiopathic scoliosis, site unspecified: Secondary | ICD-10-CM | POA: Insufficient documentation

## 2013-03-30 DIAGNOSIS — K219 Gastro-esophageal reflux disease without esophagitis: Secondary | ICD-10-CM | POA: Insufficient documentation

## 2013-03-30 DIAGNOSIS — J9610 Chronic respiratory failure, unspecified whether with hypoxia or hypercapnia: Secondary | ICD-10-CM | POA: Insufficient documentation

## 2013-03-30 DIAGNOSIS — I10 Essential (primary) hypertension: Secondary | ICD-10-CM | POA: Insufficient documentation

## 2013-03-30 DIAGNOSIS — Z860100 Personal history of colon polyps, unspecified: Secondary | ICD-10-CM | POA: Insufficient documentation

## 2013-03-30 DIAGNOSIS — Z880 Allergy status to penicillin: Secondary | ICD-10-CM | POA: Insufficient documentation

## 2013-03-30 DIAGNOSIS — IMO0002 Reserved for concepts with insufficient information to code with codable children: Secondary | ICD-10-CM | POA: Insufficient documentation

## 2013-03-30 DIAGNOSIS — J029 Acute pharyngitis, unspecified: Secondary | ICD-10-CM | POA: Insufficient documentation

## 2013-03-30 DIAGNOSIS — Z872 Personal history of diseases of the skin and subcutaneous tissue: Secondary | ICD-10-CM | POA: Insufficient documentation

## 2013-03-30 DIAGNOSIS — R0789 Other chest pain: Secondary | ICD-10-CM

## 2013-03-30 DIAGNOSIS — M545 Low back pain, unspecified: Secondary | ICD-10-CM | POA: Insufficient documentation

## 2013-03-30 DIAGNOSIS — Z79899 Other long term (current) drug therapy: Secondary | ICD-10-CM | POA: Insufficient documentation

## 2013-03-30 DIAGNOSIS — Z8742 Personal history of other diseases of the female genital tract: Secondary | ICD-10-CM | POA: Insufficient documentation

## 2013-03-30 DIAGNOSIS — R0602 Shortness of breath: Secondary | ICD-10-CM

## 2013-03-30 DIAGNOSIS — Z862 Personal history of diseases of the blood and blood-forming organs and certain disorders involving the immune mechanism: Secondary | ICD-10-CM | POA: Insufficient documentation

## 2013-03-30 DIAGNOSIS — M1990 Unspecified osteoarthritis, unspecified site: Secondary | ICD-10-CM | POA: Insufficient documentation

## 2013-03-30 DIAGNOSIS — J441 Chronic obstructive pulmonary disease with (acute) exacerbation: Secondary | ICD-10-CM | POA: Insufficient documentation

## 2013-03-30 DIAGNOSIS — Z8639 Personal history of other endocrine, nutritional and metabolic disease: Secondary | ICD-10-CM | POA: Insufficient documentation

## 2013-03-30 DIAGNOSIS — Z87891 Personal history of nicotine dependence: Secondary | ICD-10-CM | POA: Insufficient documentation

## 2013-03-30 DIAGNOSIS — R071 Chest pain on breathing: Secondary | ICD-10-CM | POA: Insufficient documentation

## 2013-03-30 LAB — COMPREHENSIVE METABOLIC PANEL
ALT: 12 U/L (ref 0–35)
Alkaline Phosphatase: 101 U/L (ref 39–117)
BUN: 25 mg/dL — ABNORMAL HIGH (ref 6–23)
CO2: 33 mEq/L — ABNORMAL HIGH (ref 19–32)
Calcium: 10.3 mg/dL (ref 8.4–10.5)
Chloride: 96 mEq/L (ref 96–112)
GFR calc Af Amer: 59 mL/min — ABNORMAL LOW (ref >90)
GFR calc non Af Amer: 51 mL/min — ABNORMAL LOW (ref >90)
Glucose, Bld: 101 mg/dL — ABNORMAL HIGH (ref 70–99)
Sodium: 139 mEq/L (ref 135–145)
Total Protein: 7.4 g/dL (ref 6.0–8.3)

## 2013-03-30 LAB — CBC WITH DIFFERENTIAL/PLATELET
Basophils Absolute: 0 10*3/uL (ref 0.0–0.1)
Eosinophils Absolute: 0.3 10*3/uL (ref 0.0–0.7)
Eosinophils Relative: 2 % (ref 0–5)
HCT: 38.6 % (ref 36.0–46.0)
Hemoglobin: 12.2 g/dL (ref 12.0–15.0)
Lymphocytes Relative: 12 % (ref 12–46)
Lymphs Abs: 1.8 10*3/uL (ref 0.7–4.0)
MCH: 28.2 pg (ref 26.0–34.0)
MCV: 89.1 fL (ref 78.0–100.0)
Monocytes Relative: 10 % (ref 3–12)
RBC: 4.33 MIL/uL (ref 3.87–5.11)
RDW: 15.2 % (ref 11.5–15.5)
WBC: 14.4 10*3/uL — ABNORMAL HIGH (ref 4.0–10.5)

## 2013-03-30 LAB — URINALYSIS, ROUTINE W REFLEX MICROSCOPIC
Bilirubin Urine: NEGATIVE
Hgb urine dipstick: NEGATIVE
Ketones, ur: NEGATIVE mg/dL
Leukocytes, UA: NEGATIVE
Nitrite: NEGATIVE
Protein, ur: NEGATIVE mg/dL
Specific Gravity, Urine: 1.013 (ref 1.005–1.030)
Urobilinogen, UA: 0.2 mg/dL (ref 0.0–1.0)

## 2013-03-30 LAB — POCT I-STAT, CHEM 8
Glucose, Bld: 97 mg/dL (ref 70–99)
HCT: 41 % (ref 36.0–46.0)
Hemoglobin: 13.9 g/dL (ref 12.0–15.0)
Potassium: 4.3 mEq/L (ref 3.5–5.1)
TCO2: 30 mmol/L (ref 0–100)

## 2013-03-30 LAB — D-DIMER, QUANTITATIVE: D-Dimer, Quant: 1.15 ug/mL-FEU — ABNORMAL HIGH (ref 0.00–0.48)

## 2013-03-30 MED ORDER — MORPHINE SULFATE 4 MG/ML IJ SOLN
4.0000 mg | Freq: Once | INTRAMUSCULAR | Status: AC
  Administered 2013-03-30: 4 mg via INTRAVENOUS
  Filled 2013-03-30: qty 1

## 2013-03-30 MED ORDER — ALBUTEROL SULFATE (5 MG/ML) 0.5% IN NEBU
5.0000 mg | INHALATION_SOLUTION | Freq: Once | RESPIRATORY_TRACT | Status: AC
  Administered 2013-03-30: 5 mg via RESPIRATORY_TRACT
  Filled 2013-03-30: qty 1

## 2013-03-30 MED ORDER — ENOXAPARIN SODIUM 100 MG/ML ~~LOC~~ SOLN
80.0000 mg | Freq: Once | SUBCUTANEOUS | Status: AC
  Administered 2013-03-30: 80 mg via SUBCUTANEOUS
  Filled 2013-03-30: qty 1

## 2013-03-30 MED ORDER — IPRATROPIUM BROMIDE 0.02 % IN SOLN
0.5000 mg | Freq: Once | RESPIRATORY_TRACT | Status: AC
  Administered 2013-03-30: 0.5 mg via RESPIRATORY_TRACT
  Filled 2013-03-30: qty 2.5

## 2013-03-30 MED ORDER — DIPHENHYDRAMINE HCL 25 MG PO CAPS: ORAL_CAPSULE | ORAL | Status: DC

## 2013-03-30 MED ORDER — PREDNISONE 50 MG PO TABS: ORAL_TABLET | ORAL | Status: DC

## 2013-03-30 NOTE — ED Provider Notes (Signed)
CSN: [id number]     Arrival date & time 03/30/13  4098 History   First MD Initiated Contact with Patient 03/30/13 [phone]     Chief Complaint  Patient presents with  . Shortness of Breath  . Back Pain   (Consider location/radiation/quality/duration/timing/severity/associated sxs/prior Treatment) HPI Patient reports 3 days ago she started getting pain in her right posterior chest area. She denies any known injury or change in activity. She states any type of movement of her right arm or changing positions makes the pain worse. Nothing makes it feel better. She states the first night she had pain she got up during the night to do a nebulizer and noticed the pain. She lives in assisted living and told her nurse she was having the pain. She was given Tylenol which she vomited. She then was given oral morphine and states the pain quit for a while. She is on oral morphine twice a day and she gets liquid morphine when necessary every 2 hours for pain. She denies any cough or fever. But she states she's had a cold for 2 weeks. When asked what that is she states she has a sore throat and yellow rhinorrhea and shortness of breath. She denies nausea, vomiting or diarrhea. She states she has pain and swelling in her left leg that is chronic. She states she's had shingles in the past and this is different. She states she went to [facility] ED 2 days ago for this discomfort and had a chest x-ray done, however she refused to have a CT done because she just had one done recently by her vascular surgeon to evaluate a stent that he placed in her aortic aneurysm in 2010. She states they discussed that if anything changed in her aneurysm there was nothing they could do due to the severity of her underlying lung disease. She states she was started on a Z-Pak from the emergency department 2 days ago which has not helped. Pt states she is nonambulatory b/o her lung disease.   PCP Dr Mammie Lorenzo [facility] Pulmonary Dr  Delford Field  Past Medical History  Diagnosis Date  . History of colonic polyps   . COPD (chronic obstructive pulmonary disease)   . Diverticulitis of colon   . GERD (gastroesophageal reflux disease)   . HTN (hypertension)   . Osteoarthritis   . Hyperlipemia   . Aortic aneurysm, abdominal   . Ovarian cyst, left   . Psoriasis   . Chronic respiratory failure     Uses home oxygen at 2 L  . ADRENAL MASS 05/04/2007  . GLAUCOMA 03/30/2007  . HEMORRHOIDS, INTERNAL 03/23/2008  . OSTEOPOROSIS 03/30/2007  . SCOLIOSIS 03/30/2007  . PONV (postoperative nausea and vomiting)    Past Surgical History  Procedure Laterality Date  . Kidney stone surgery    . Appendectomy    . Tonsillectomy    . Hemiarthroplasty shoulder fracture    . Cataract extraction, bilateral    . Abdominal aortic aneurysm repair    . Abdominal hysterectomy     Family History  Problem Relation Age of Onset  . Heart attack Mother   . Heart attack Father   . Deep vein thrombosis Father   . Cancer Brother     Pancreas and colon   History  Substance Use Topics  . Smoking status: Former Smoker -- 0.50 packs/day for 40 years    Types: Cigarettes    Quit date: 06/16/2009  . Smokeless tobacco: Never Used  . Alcohol Use: No  Lives in ALF Oxygen 3 lpm [place]   OB History   Grav Para Term Preterm Abortions TAB SAB Ect Mult Living                 Review of Systems  All other systems reviewed and are negative.    Allergies  Clarithromycin; Codeine; Hydrocodone-guaifenesin; Iohexol; Moxifloxacin; Penicillins; Prednisone; Simvastatin; and Sulfonamide derivatives  Home Medications   Current Outpatient Rx  Name  Route  Sig  Dispense  Refill  . acetaminophen (TYLENOL) 325 MG tablet   Oral   Take 650 mg by mouth every 6 (six) hours as needed for pain or fever.         [name] albuterol (PROVENTIL) (2.5 MG/3ML) 0.083% nebulizer solution   Nebulization   Take 2.5 mg by nebulization every 2 (two) hours as needed for  wheezing or shortness of breath.          . ALPRAZolam (XANAX) 1 MG tablet      Take 1 mg at 8 am and 2 pm and 1.5 mg at bedtime   90 tablet   0   . budesonide (PULMICORT) 0.25 MG/2ML nebulizer solution   Nebulization   Take 2 mLs (0.25 mg total) by nebulization 2 (two) times daily.   60 mL   5   . docusate sodium (COLACE) 100 MG capsule   Oral   Take 100 mg by mouth 2 (two) times daily.         [name] esomeprazole (NEXIUM) 40 MG capsule   Oral   Take 40 mg by mouth daily.         . fluticasone (FLONASE) 50 MCG/ACT nasal spray   Nasal   Place 1 spray into the nose daily as needed.         . furosemide (LASIX) 20 MG tablet   Oral   Take 1 tablet (20 mg total) by mouth daily.   30 tablet   5   . ipratropium (ATROVENT) 0.02 % nebulizer solution   Nebulization   Take 2.5 mLs (0.5 mg total) by nebulization every 6 (six) hours.   125 mL   5   . losartan (COZAAR) 50 MG tablet   Oral   Take 1 tablet (50 mg total) by mouth daily. Hold for sbp <  Or = 100   30 tablet   5   . mirtazapine (REMERON) 15 MG tablet   Oral   Take 1 tablet (15 mg total) by mouth at bedtime.   30 tablet   3   . morphine (MSIR) 15 MG tablet   Oral   Take 1 tablet (15 mg total) by mouth 3 (three) times daily.   90 tablet   0     Hospice patient   . morphine (ROXANOL) 20 MG/ML concentrated solution   Oral   Take 0.5 mLs (10 mg total) by mouth every 2 (two) hours as needed for pain (or shortness of breath). Need prefilled syringes 0.5 ml.   30 mL   0     Hospice patient   . potassium chloride (KLOR-CON M10) 10 MEQ tablet   Oral   Take 1 tablet (10 mEq total) by mouth daily.   30 tablet   5   . predniSONE (DELTASONE) 10 MG tablet      Take one by mouth every morning with food.   15 tablet   5   . Alum & Mag Hydroxide-Simeth (MAGIC MOUTHWASH W/LIDOCAINE) SOLN   Oral  Take 5 mLs by mouth 4 (four) times daily as needed (for mouth pain).   120 mL   0   . antiseptic oral  rinse (BIOTENE) LIQD   Mouth Rinse   15 mLs by Mouth Rinse route as needed (for antiseptic).         . benzonatate (TESSALON) 100 MG capsule   Oral   Take 100 mg by mouth 3 (three) times daily as needed for cough.         . chlorpheniramine-HYDROcodone (TUSSIONEX) 10-8 MG/5ML LQCR   Oral   Take 5 mLs by mouth every 12 (twelve) hours as needed (for cough).         . NON FORMULARY      Oxygen 3 liters 24/7         . polyethylene glycol (MIRALAX / GLYCOLAX) packet   Oral   Take 17 g by mouth daily as needed (for constipation).         . prochlorperazine (COMPAZINE) 10 MG tablet   Oral   Take 10 mg by mouth every 6 (six) hours as needed (for nausea/vomiting).         . sodium chloride (OCEAN) 0.65 % nasal spray   Nasal   Place 1 spray into the nose as needed for congestion.          BP 148/91  Pulse 134  Temp(Src) 98.9 F (37.2 C) (Oral)  Resp 21  SpO2 100%  Vital signs normal except tachycardia  Physical Exam  Nursing note and vitals reviewed. Constitutional: She is oriented to person, place, and time.  Non-toxic appearance. She does not appear ill. No distress.  Frail elderly female  HENT:  Head: Normocephalic and atraumatic.  Right Ear: External ear normal.  Left Ear: External ear normal.  Nose: Nose normal. No mucosal edema or rhinorrhea.  Mouth/Throat: Oropharynx is clear and moist and mucous membranes are normal. No dental abscesses or uvula swelling.  Eyes: Conjunctivae and EOM are normal. Pupils are equal, round, and reactive to light.  Neck: Normal range of motion and full passive range of motion without pain. Neck supple.  Cardiovascular: Normal rate, regular rhythm and normal heart sounds.  Exam reveals no gallop and no friction rub.   No murmur heard. Pulmonary/Chest: She is in respiratory distress. She has decreased breath sounds. She has no wheezes. She has no rhonchi. She has no rales.   She exhibits no tenderness and no crepitus.  Area  of pain noted  Abdominal: Soft. Normal appearance and bowel sounds are normal. She exhibits no distension. There is no tenderness. There is no rebound and no guarding.  Musculoskeletal: Normal range of motion. She exhibits no edema and no tenderness.  Moves all extremities well.   Neurological: She is alert and oriented to person, place, and time. She has normal strength. No cranial nerve deficit.  Skin: Skin is warm, dry and intact. No rash noted. No erythema. No pallor.  Psychiatric: She has a normal mood and affect. Her speech is normal and behavior is normal. Her mood appears not anxious.    ED Course  Procedures (including critical care time)  Medications  enoxaparin (LOVENOX) injection 80 mg (not administered)  albuterol (PROVENTIL) (5 MG/ML) 0.5% nebulizer solution 5 mg (5 mg Nebulization Given 03/30/13 1024)  ipratropium (ATROVENT) nebulizer solution 0.5 mg (0.5 mg Nebulization Given 03/30/13 1023)  morphine 4 MG/ML injection 4 mg (4 mg Intravenous Given 03/30/13 0956)   I had ordered a CT angiogram chest  however patient has significant allergy. CT personal states she is to have a 13 hour prep prior to her CT scan. This entails prednisone 50 mg tablets or 10 hours before, 7 hours before, and 1 hour before with Benadryl 50 mg orally one hour before her CT scan. Patient is familiar with this prep. She was given an appointment for tomorrow at 10:30 in the morning. She was given Lovenox 1.5 mg per KG subcutaneous in the emergency department which will cover her for 24 hours. At that point her CT scan will either show she has or does not have a PE and further need for anticoagulation will be determined.  Patient has are on morphine orally on a regular schedule and  with when necessary morphine (ordered every 2 hrs). She got significant relief of her pain with IV morphine in the ED. She's advised to discuss her pain management with her own primary care doctor.   Labs Review Results for  orders placed during the hospital encounter of 03/30/13  CBC WITH DIFFERENTIAL      Result Value Range   WBC 14.4 (*) 4.0 - 10.5 K/uL   RBC 4.33  3.87 - 5.11 MIL/uL   Hemoglobin 12.2  12.0 - 15.0 g/dL   HCT 91.4  78.2 - 95.6 %   MCV 89.1  78.0 - 100.0 fL   MCH 28.2  26.0 - 34.0 pg   MCHC 31.6  30.0 - 36.0 g/dL   RDW 21.3  08.6 - 57.8 %   Platelets 281  150 - 400 K/uL   Neutrophils Relative % 76  43 - 77 %   Neutro Abs 11.0 (*) 1.7 - 7.7 K/uL   Lymphocytes Relative 12  12 - 46 %   Lymphs Abs 1.8  0.7 - 4.0 K/uL   Monocytes Relative 10  3 - 12 %   Monocytes Absolute 1.4 (*) 0.1 - 1.0 K/uL   Eosinophils Relative 2  0 - 5 %   Eosinophils Absolute 0.3  0.0 - 0.7 K/uL   Basophils Relative 0  0 - 1 %   Basophils Absolute 0.0  0.0 - 0.1 K/uL  COMPREHENSIVE METABOLIC PANEL      Result Value Range   Sodium 139  135 - 145 mEq/L   Potassium 4.3  3.5 - 5.1 mEq/L   Chloride 96  96 - 112 mEq/L   CO2 33 (*) 19 - 32 mEq/L   Glucose, Bld 101 (*) 70 - 99 mg/dL   BUN 25 (*) 6 - 23 mg/dL   Creatinine, Ser 4.69  0.50 - 1.10 mg/dL   Calcium 62.9  8.4 - 52.8 mg/dL   Total Protein 7.4  6.0 - 8.3 g/dL   Albumin 4.0  3.5 - 5.2 g/dL   AST 17  0 - 37 U/L   ALT 12  0 - 35 U/L   Alkaline Phosphatase 101  39 - 117 U/L   Total Bilirubin 0.4  0.3 - 1.2 mg/dL   GFR calc non Af Amer 51 (*) >90 mL/min   GFR calc Af Amer 59 (*) >90 mL/min  D-DIMER, QUANTITATIVE      Result Value Range   D-Dimer, Quant 1.15 (*) 0.00 - 0.48 ug/mL-FEU  PRO B NATRIURETIC PEPTIDE      Result Value Range   Pro B Natriuretic peptide (BNP) 550.8 (*) 0 - 450 pg/mL  URINALYSIS, ROUTINE W REFLEX MICROSCOPIC      Result Value Range   Color, Urine YELLOW  YELLOW  APPearance CLOUDY (*) CLEAR   Specific Gravity, Urine 1.013  1.005 - 1.030   pH 6.0  5.0 - 8.0   Glucose, UA NEGATIVE  NEGATIVE mg/dL   Hgb urine dipstick NEGATIVE  NEGATIVE   Bilirubin Urine NEGATIVE  NEGATIVE   Ketones, ur NEGATIVE  NEGATIVE mg/dL   Protein, ur  NEGATIVE  NEGATIVE mg/dL   Urobilinogen, UA 0.2  0.0 - 1.0 mg/dL   Nitrite NEGATIVE  NEGATIVE   Leukocytes, UA NEGATIVE  NEGATIVE  POCT I-STAT, CHEM 8      Result Value Range   Sodium 140  135 - 145 mEq/L   Potassium 4.3  3.5 - 5.1 mEq/L   Chloride 101  96 - 112 mEq/L   BUN 26 (*) 6 - 23 mg/dL   Creatinine, Ser 8.11 (*) 0.50 - 1.10 mg/dL   Glucose, Bld 97  70 - 99 mg/dL   Calcium, Ion 9.14  7.82 - 1.30 mmol/L   TCO2 30  0 - 100 mmol/L   Hemoglobin 13.9  12.0 - 15.0 g/dL   HCT 95.6  21.3 - 08.6 %   Laboratory interpretation all normal except minor elevation of BUN and CO2, elevation of ddimer, leukocytosis    Imaging Review Dg Chest Port 1 View  03/30/2013   CLINICAL DATA:  Shortness of breath. Ex back with history of COPD.  EXAM: PORTABLE CHEST - 1 VIEW  COMPARISON:  10/12/2012 and 12/06/2012.  FINDINGS: The heart size and mediastinal contours are stable. There is aortic atherosclerosis. There is chronic atelectasis or scarring at the left lung base adjacent to a Bochdalek hernia. A small nodular density overlapping the right scapula is not seen on the recent prior studies and may be related to the adjacent telemetry lead. The lungs otherwise appear clear. Patient is status post left shoulder arthroplasty.  IMPRESSION: No acute findings identified. Cannot exclude a small right apical nodule, although this may be related to the adjacent telemetry lead.   Electronically Signed   By: Roxy Horseman M.D.   On: 03/30/2013 10:25   Ct Angio Abd/pel W/ And/or W/o  03/11/2013   CLINICAL DATA:  Abdominal aortic aneurysm post stent graft placement.    IMPRESSION: 1. New L1 vertebral compression fracture deformity since previous exam. 2. Patent infrarenal bifurcated aortic stent graft with persistent type 2 endoleak, stable 4.3 cm native aneurysm sac diameter. 3. Thoracic aortic aneurysm, incompletely visualized. 4. Bilateral renal ostial plaque resulting in possibly significant stenosis on the right.  5.  Stable 6 mm distal left renal artery aneurysm. 6. Bilateral nephrolithiasis without hydronephrosis.   Electronically Signed   By: Oley Balm M.D.   On: 03/11/2013 16:27    EKG Interpretation     Ventricular Rate:  113 PR Interval:  168 QRS Duration: 87 QT Interval:  342 QTC Calculation: 469 R Axis:   21 Text Interpretation:  Sinus tachycardia Baseline wander prominant P waves borderline LAD Probable anteroseptal infarct, old Since last tracing rate faster            MDM   1. Right-sided chest wall pain   2. SOB (shortness of breath)     New Prescriptions   DIPHENHYDRAMINE (BENADRYL) 25 MG CAPSULE    Take 2 capsules at 9:30 am on Thursday morning.   PREDNISONE (DELTASONE) 50 MG TABLET    Take 1 pill at 9:30 pm tonight, 1 pill at 3:30 am tomorrow morning and 1 pill at 9:30 am  tomorrow morning    Plan  discharge   Devoria Albe, MD, Armando Gang    Ward Givens, MD 03/30/13 [phone]

## 2013-03-30 NOTE — ED Notes (Signed)
Pt c/o rt lower/mid back pain with SOB; pt states went to [facility] ED for same, worse this am unable to take her a deep breathe; pt on 3l/min/[place] Pt denies injury

## 2013-03-31 ENCOUNTER — Ambulatory Visit (HOSPITAL_COMMUNITY)
Admit: 2013-03-31 | Discharge: 2013-03-31 | Disposition: A | Discharge status: Home / Self Care | Attending: Emergency Medicine | Admitting: Emergency Medicine

## 2013-03-31 ENCOUNTER — Other Ambulatory Visit: Payer: Self-pay | Admitting: Family Medicine

## 2013-03-31 ENCOUNTER — Encounter (HOSPITAL_COMMUNITY): Payer: Self-pay

## 2013-03-31 DIAGNOSIS — R0602 Shortness of breath: Secondary | ICD-10-CM

## 2013-03-31 MED ORDER — IOHEXOL 350 MG/ML SOLN
80.0000 mL | Freq: Once | INTRAVENOUS | Status: AC | PRN
  Administered 2013-03-31: 80 mL via INTRAVENOUS

## 2013-03-31 MED ORDER — ALPRAZOLAM 1 MG PO TABS: ORAL_TABLET | ORAL | Status: DC

## 2013-03-31 NOTE — ED Provider Notes (Signed)
Pt returned for ct angio chest r/o pe - I was asked to give pt results. Discussed ct w pt/family.   Neg acute x note made of T8 compression fx.  Discussed w pt, pt unaware of dx prior. Pain is mid back. Will give rx ultram. pcp follow up at [facility].     Suzi Roots, MD 03/31/13 [phone]

## 2013-04-01 MED ORDER — ALPRAZOLAM 0.5 MG PO TABS: ORAL_TABLET | ORAL | Status: DC

## 2013-04-01 NOTE — Addendum Note (Signed)
Addended by: Dianne Dun on: 04/01/2013 11:41 AM   Modules accepted: Orders

## 2013-04-01 NOTE — Telephone Encounter (Signed)
RX faxed to [facility].

## 2013-04-01 NOTE — Telephone Encounter (Signed)
That's a very high dose.  Let's try 0.5 mg q 4 hours prn.  I will print out rx.

## 2013-04-01 NOTE — Telephone Encounter (Signed)
RX faxed to [facility]. 

## 2013-04-01 NOTE — Telephone Encounter (Signed)
[name] received the alprazolam 1 mg rx for scheduled times for pt to receive med. [name] needs order also for alprazolam 1 mg to take 1 tab q4h prn.[name] said the prn order was prescribed but cannot find the actual order and wants to know if Dr Dayton Martes will send prn order also.Please advise. If Dr Dayton Martes does not want to prescribe a prn dosing, [name] will need order to d/c the prn dosing at [facility].

## 2013-04-04 ENCOUNTER — Other Ambulatory Visit: Payer: Self-pay

## 2013-04-04 NOTE — Telephone Encounter (Signed)
[name] with [facility] left v/m pt is out of Morphine pill and Morphine liquid; [name] needs written order faxed to [facility]; fax # [phone]. Pt was seen for back pain and now pain has moved to chest area and [name] request order for CXR. Spoke with [name] med tech at [facility] and she said Dr Dayton Martes had OKed thru hospice to increase roxinol to 20 mg every hour as needed, but [name] needs hard script to send to pharmacy. [name] said pt was seen at [facility] ED and had CT Angio of chest / but has not gotten report yet; pt [name] complaining with SOB but pt refuses to go back to ED.Please advise.

## 2013-04-05 MED ORDER — MORPHINE SULFATE (CONCENTRATE) 20 MG/ML PO SOLN: 20.0000 mg | ORAL | Status: AC | PRN

## 2013-04-05 MED ORDER — MORPHINE SULFATE 15 MG PO TABS: 15.0000 mg | ORAL_TABLET | Freq: Three times a day (TID) | ORAL | Status: DC

## 2013-04-05 NOTE — Telephone Encounter (Signed)
Just had CT of chest and was negative except for fracture of T8 I doubt she needs another CXR now Send Rx  I would just have hospice nurse reevaluate and we can see what she thinks.

## 2013-04-05 NOTE — Telephone Encounter (Signed)
Spoke with [facility] and advised results, she will have the hospice nurse check her out again Faxed rx to [facility] [employer] [facility]

## 2013-04-07 ENCOUNTER — Telehealth: Payer: Self-pay

## 2013-04-07 NOTE — Telephone Encounter (Signed)
[facility] [place] left v/m to be aware that pt is going to be [facility] for pain and symptom management today. Recently pt has been seen in ED x 2 with COPD and 2nd trip to ED for compression fx. Pt has not been able to get relief of pain with medication.

## 2013-04-08 NOTE — Telephone Encounter (Signed)
noted 

## 2013-04-12 ENCOUNTER — Ambulatory Visit: Payer: Medicare Other | Admitting: Critical Care Medicine

## 2013-04-15 ENCOUNTER — Telehealth: Payer: Self-pay

## 2013-04-15 NOTE — Telephone Encounter (Signed)
[name] left v/m; [name] faxed over clarification of meds for pt and request review clarifications and fax back. No call back needed unless questions.

## 2013-04-21 ENCOUNTER — Ambulatory Visit (INDEPENDENT_AMBULATORY_CARE_PROVIDER_SITE_OTHER): Payer: Medicare Other | Admitting: Critical Care Medicine

## 2013-04-21 ENCOUNTER — Other Ambulatory Visit: Payer: Self-pay

## 2013-04-21 ENCOUNTER — Encounter: Payer: Self-pay | Admitting: Critical Care Medicine

## 2013-04-21 VITALS — BP 126/78 | HR 91 | Temp 99.7°F | Ht 60.0 in | Wt 121.0 lb

## 2013-04-21 DIAGNOSIS — S22000G Wedge compression fracture of unspecified thoracic vertebra, subsequent encounter for fracture with delayed healing: Secondary | ICD-10-CM

## 2013-04-21 DIAGNOSIS — IMO0002 Reserved for concepts with insufficient information to code with codable children: Secondary | ICD-10-CM

## 2013-04-21 DIAGNOSIS — S22000A Wedge compression fracture of unspecified thoracic vertebra, initial encounter for closed fracture: Secondary | ICD-10-CM | POA: Insufficient documentation

## 2013-04-21 DIAGNOSIS — J9610 Chronic respiratory failure, unspecified whether with hypoxia or hypercapnia: Secondary | ICD-10-CM

## 2013-04-21 DIAGNOSIS — J449 Chronic obstructive pulmonary disease, unspecified: Secondary | ICD-10-CM

## 2013-04-21 MED ORDER — MORPHINE SULFATE ER 30 MG PO TBCR: 30.0000 mg | EXTENDED_RELEASE_TABLET | Freq: Three times a day (TID) | ORAL | Status: AC

## 2013-04-21 NOTE — Progress Notes (Signed)
Subjective:    Patient ID: [name], female    DOB: 07-Mar-1931, 79 y.o.   MRN: [id number]  HPI  79 y.o. . WF Copd Gold D Oxygen dependent.  Adm 2/13 , 11/13 , and 05/20/12 , 06/2012 , 2/ 2014, 10/2012  04/21/2013 Chief Complaint  Patient presents with  . 2 month follow up    Breathing has worsened over the past 2 wks - has increased SOB, shooting pains from back to side, and wheezing.  No cough.    Pt in [facility] and [facility] twice in past month. Then hospice home for 7days for pain management. Pain is R side, knifelike, into mid of back.  Xrays neg.  T8 compression fx.  Pt in hospice home for 7days   Now in AL in [place]. Pain now is worse.      Past Medical History  Diagnosis Date  . History of colonic polyps   . COPD (chronic obstructive pulmonary disease)   . Diverticulitis of colon   . GERD (gastroesophageal reflux disease)   . HTN (hypertension)   . Osteoarthritis   . Hyperlipemia   . Aortic aneurysm, abdominal   . Ovarian cyst, left   . Psoriasis   . Chronic respiratory failure     Uses home oxygen at 2 L  . ADRENAL MASS 05/04/2007  . GLAUCOMA 03/30/2007  . HEMORRHOIDS, INTERNAL 03/23/2008  . OSTEOPOROSIS 03/30/2007  . SCOLIOSIS 03/30/2007  . PONV (postoperative nausea and vomiting)       Review of Systems   Constitutional:   No  weight loss, night sweats,  Fevers, chills,  +fatigue, or  lassitude.  HEENT:   No headaches,  Difficulty swallowing,  Tooth/dental problems, or  Sore throat,                No sneezing, itching, ear ache, nasal congestion, post nasal drip,   CV:  No chest pain,  Orthopnea, PND, swelling in lower extremities, anasarca, dizziness, palpitations, syncope.   GI  No bloody stools.   Resp:   No coughing up of blood.  [name]  No chest wall deformity  Skin: no rash or lesions.  GU: no dysuria, change in color of urine, no urgency or frequency.  No flank pain, no hematuria   MS:  No joint pain or swelling.  No decreased range of motion.  No  back pain.  Psych:  No change in mood or affect. No depression or anxiety.  No memory loss.    Objective:   Physical Exam  Filed Vitals:   04/21/13 1032 04/21/13 1038  BP: 126/78   Pulse: 91 91  Temp: 99.7 F (37.6 C)   TempSrc: Oral   Height: 5' (1.524 m)   Weight: 54.885 kg (121 lb)   SpO2: 79% 94%    Gen: Pleasant, well-nourished, in no distress, anxious, +wheelchair, O2 3L  ENT: No lesions,  mouth clear,  oropharynx clear, no postnasal drip  Neck: +1 JVD, no TMG, no carotid bruits  Lungs: No use of accessory muscles, no dullness to percussion, distant BS, poor air flow  Cardiovascular: RRR, heart sounds normal, no murmur or gallops, no peripheral edema  Abdomen: soft and NT, no HSM,  BS normal  Musculoskeletal: No deformities, no cyanosis or clubbing. + Edema L>R foot  Neuro: alert, non focal  Skin: Warm, no lesions or rashes  No results found.     Assessment & Plan:   COPD (chronic obstructive pulmonary disease), Gold D  End stage  COPD Plan Maintain inhaled medicines and oxygen therapy as prescribed  Chronic respiratory failure Chronic respiratory failure oxygen dependent  Compression fx, thoracic spine The patient's symptoms today relate to chronic pain from thoracic spine fracture I discussed the patient's pain management with the hospice care team and would appreciate their assistance in pain management    Updated Medication List Outpatient Encounter Prescriptions as of 04/21/2013  Medication Sig  . albuterol (PROVENTIL) (2.5 MG/3ML) 0.083% nebulizer solution Take 2.5 mg by nebulization 4 (four) times daily. And as needed  . ALPRAZolam (XANAX) 1 MG tablet Take 1 mg at 8 am and 2 pm and 1.5 mg at bedtime and 1/2 tablet by mouth every 2 hours prn anxiety and SOB  . Alum & Mag Hydroxide-Simeth (MAGIC MOUTHWASH W/LIDOCAINE) SOLN Take 5 mLs by mouth 4 (four) times daily as needed (for mouth pain).  . furosemide (LASIX) 20 MG tablet Take 1 tablet (20  mg total) by mouth daily.  [name] ipratropium (ATROVENT) 0.02 % nebulizer solution Take 0.5 mg by nebulization every 6 (six) hours. And as needed  . mirtazapine (REMERON) 15 MG tablet Take 1 tablet (15 mg total) by mouth at bedtime.  [name] morphine (ROXANOL) 20 MG/ML concentrated solution Take 1 mL (20 mg total) by mouth every 2 (two) hours as needed for pain (or shortness of breath). Need prefilled syringes 0.5 ml.  . NON FORMULARY Oxygen 3 liters 24/7  . omeprazole (PRILOSEC) 20 MG capsule Take 20 mg by mouth daily.  . ondansetron (ZOFRAN) 8 MG tablet Take by mouth every 8 (eight) hours as needed for nausea or vomiting.  . polyethylene glycol (MIRALAX / GLYCOLAX) packet Take 17 g by mouth daily as needed (for constipation).  . potassium chloride (KLOR-CON M10) 10 MEQ tablet Take 1 tablet (10 mEq total) by mouth daily.  . predniSONE (DELTASONE) 10 MG tablet Take one by mouth every morning with food.  . prochlorperazine (COMPAZINE) 10 MG tablet Take 10 mg by mouth every 6 (six) hours as needed (for nausea/vomiting).  . simethicone (MYLICON) 80 MG chewable tablet Chew 80 mg by mouth every 6 (six) hours as needed for flatulence.  . [DISCONTINUED] ALPRAZolam (XANAX) 1 MG tablet Take 1 mg at 8 am and 2 pm and 1.5 mg at bedtime  . [DISCONTINUED] ipratropium (ATROVENT) 0.02 % nebulizer solution Take 2.5 mLs (0.5 mg total) by nebulization every 6 (six) hours.  . [DISCONTINUED] morphine (MS CONTIN) 15 MG 12 hr tablet Take 15 mg by mouth 3 (three) times daily.  . [DISCONTINUED] morphine (MS CONTIN) 30 MG 12 hr tablet Take 30 mg by mouth 3 (three) times daily.  . [DISCONTINUED] morphine (MSIR) 15 MG tablet Take 1 tablet (15 mg total) by mouth 3 (three) times daily.  . benzonatate (TESSALON) 100 MG capsule Take 100 mg by mouth 3 (three) times daily as needed for cough.  . budesonide (PULMICORT) 0.25 MG/2ML nebulizer solution Take 2 mLs (0.25 mg total) by nebulization 2 (two) times daily.  .  chlorpheniramine-HYDROcodone (TUSSIONEX) 10-8 MG/5ML LQCR Take 5 mLs by mouth every 12 (twelve) hours as needed (for cough).  . [DISCONTINUED] acetaminophen (TYLENOL) 325 MG tablet Take 650 mg by mouth every 6 (six) hours as needed for pain or fever.  . [DISCONTINUED] ALPRAZolam (XANAX) 0.5 MG tablet 1 tab by mouth every 4 hours as needed for anxiety or shortness of breath  . [DISCONTINUED] antiseptic oral rinse (BIOTENE) LIQD 15 mLs by Mouth Rinse route as needed (for antiseptic).  . [DISCONTINUED] diphenhydrAMINE (  BENADRYL) 25 mg capsule Take 2 capsules at 9:30 am on Thursday morning.  . [DISCONTINUED] docusate sodium (COLACE) 100 MG capsule Take 100 mg by mouth 2 (two) times daily.  . [DISCONTINUED] esomeprazole (NEXIUM) 40 MG capsule Take 40 mg by mouth daily.  . [DISCONTINUED] fluticasone (FLONASE) 50 MCG/ACT nasal spray Place 1 spray into the nose daily as needed.  . [DISCONTINUED] losartan (COZAAR) 50 MG tablet Take 1 tablet (50 mg total) by mouth daily. Hold for sbp <  Or = 100  . [DISCONTINUED] predniSONE (DELTASONE) 50 MG tablet Take 1 pill at 9:30 pm tonight, 1 pill at 3:30 am tomorrow morning and 1 pill at 9:30 am  tomorrow morning  . [DISCONTINUED] sodium chloride (OCEAN) 0.65 % nasal spray Place 1 spray into the nose as needed for congestion.

## 2013-04-21 NOTE — Patient Instructions (Signed)
We will contact your Hospice RN to discuss pain management  No change in medications for now Return as needed

## 2013-04-21 NOTE — Telephone Encounter (Signed)
[name] has left today and will forward request to Dr Alphonsus Sias; fax placed in Dr Karle Starch in box.

## 2013-04-21 NOTE — Telephone Encounter (Signed)
[name] with [facility] faxed request for new script for Morphine 30 mg. Pt is completely out.  Fax placed on Nicki Reaper, NP desk for review.

## 2013-04-21 NOTE — Telephone Encounter (Signed)
rx printed, signed by Dr. Alphonsus Sias and faxed to [facility]

## 2013-04-22 ENCOUNTER — Telehealth: Payer: Self-pay

## 2013-04-22 NOTE — Telephone Encounter (Signed)
[name] with Hospice left v/m requesting orders faxed to [facility] fax # [phone]. Needs order for oxygen 3.5 liters continuously and 3 liters while on home fill system. 2) Senna S two tabs po bid    3) flonase nasal spray q AM   4) D/C prilosec and start Nexium 40 mg taking one cap by mouth daily. Dr Delford Field, pulmonologist called Cassie wanting PCP to write Gabapentin 100 mg po three times daily for neuropathic pain. Cassie does not need cb.

## 2013-04-22 NOTE — Assessment & Plan Note (Signed)
The patient's symptoms today relate to chronic pain from thoracic spine fracture I discussed the patient's pain management with the hospice care team and would appreciate their assistance in pain management

## 2013-04-22 NOTE — Assessment & Plan Note (Signed)
Chronic respiratory failure oxygen dependent 

## 2013-04-22 NOTE — Assessment & Plan Note (Signed)
End stage COPD Plan Maintain inhaled medicines and oxygen therapy as prescribed

## 2013-04-22 NOTE — Telephone Encounter (Signed)
Ok for verbal order  °

## 2013-04-22 NOTE — Telephone Encounter (Signed)
Orders faxed to [facility] at [phone].

## 2013-04-26 ENCOUNTER — Telehealth: Payer: Self-pay

## 2013-04-26 NOTE — Telephone Encounter (Signed)
Cassie with [facility]osp[facility]  left v/m;pt is residing at [facility]; [place] request med adjustment; pt complains of being too lethargic on current dose of MS contin. Pt is presently on MS Contin 45 mg 8AM, 2PM and 8PM. Recommend reduce 2PM dose to 15 mg. Request written order for MS Contin 8AM 45 mg and 2PM 15 mg and 8PM 45 mg to [facility]. Pt is also taking Duoneb  Request Duoneb order changed to 6AM, 10AM,2PM and 6PM and prn dose if needed. Pt also complains of sore mouth and Cassie request written order for nystatin .Please advise. Please fax orders to [facility] [phone].

## 2013-04-27 NOTE — Telephone Encounter (Signed)
Faxed to [facility].

## 2013-04-27 NOTE — Telephone Encounter (Signed)
RX done and in my out box 

## 2013-05-01 ENCOUNTER — Inpatient Hospital Stay: Payer: Self-pay | Admitting: Internal Medicine

## 2013-05-01 LAB — CK TOTAL AND CKMB (NOT AT ARMC)
CK, Total: 132 U/L (ref 21–215)
CK, Total: 115 U/L (ref 21–215)
CK-MB: 2 ng/mL (ref 0.5–3.6)
CK-MB: 2 ng/mL (ref 0.5–3.6)

## 2013-05-01 LAB — TROPONIN I
Troponin-I: <0.02 ng/mL
Troponin-I: 0.03 ng/mL
Troponin-I: <0.02 ng/mL

## 2013-05-01 LAB — URINALYSIS, COMPLETE
Bacteria: NONE SEEN
Bilirubin,UR: NEGATIVE
Glucose,UR: NEGATIVE mg/dL (ref 0–75)
Ketone: NEGATIVE
Leukocyte Esterase: NEGATIVE
Ph: 5 (ref 4.5–8.0)
Protein: 30

## 2013-05-01 LAB — COMPREHENSIVE METABOLIC PANEL
Albumin: 3.3 g/dL — ABNORMAL LOW (ref 3.4–5.0)
Anion Gap: 0 — ABNORMAL LOW (ref 7–16)
BUN: 22 mg/dL — ABNORMAL HIGH (ref 7–18)
Bilirubin,Total: 0.3 mg/dL (ref 0.2–1.0)
Co2: 42 mmol/L (ref 21–32)
EGFR (Non-African Amer.): 42 — ABNORMAL LOW
Potassium: 3.7 mmol/L (ref 3.5–5.1)
SGOT(AST): 21 U/L (ref 15–37)
Total Protein: 6.8 g/dL (ref 6.4–8.2)

## 2013-05-01 LAB — CBC
HGB: 10.1 g/dL — ABNORMAL LOW (ref 12.0–16.0)
MCV: 88 fL (ref 80–100)
Platelet: 215 10*3/uL (ref 150–440)
RBC: 3.68 10*6/uL — ABNORMAL LOW (ref 3.80–5.20)
RDW: 14.9 % — ABNORMAL HIGH (ref 11.5–14.5)
WBC: 8.2 10*3/uL (ref 3.6–11.0)

## 2013-05-01 LAB — PROTIME-INR: Prothrombin Time: 11.7 secs (ref 11.5–14.7)

## 2013-05-02 LAB — BASIC METABOLIC PANEL
Anion Gap: 3 — ABNORMAL LOW (ref 7–16)
BUN: 18 mg/dL (ref 7–18)
Calcium, Total: 8.9 mg/dL (ref 8.5–10.1)
Chloride: 105 mmol/L (ref 98–107)
Co2: 34 mmol/L — ABNORMAL HIGH (ref 21–32)
Creatinine: 1.13 mg/dL (ref 0.60–1.30)
EGFR (African American): 53 — ABNORMAL LOW
Potassium: 3.7 mmol/L (ref 3.5–5.1)

## 2013-05-02 LAB — CBC WITH DIFFERENTIAL/PLATELET
Basophil %: 0.1 %
Eosinophil #: 0 10*3/uL (ref 0.0–0.7)
Eosinophil %: 0 %
HCT: 25.8 % — ABNORMAL LOW (ref 35.0–47.0)
HGB: 8.3 g/dL — ABNORMAL LOW (ref 12.0–16.0)
MCH: 28 pg (ref 26.0–34.0)
MCV: 87 fL (ref 80–100)
Monocyte #: 0.3 x10 3/mm (ref 0.2–0.9)
Monocyte %: 6.8 %
Neutrophil #: 4.2 10*3/uL (ref 1.4–6.5)
Neutrophil %: 84.2 %
RDW: 14.1 % (ref 11.5–14.5)
WBC: 5 10*3/uL (ref 3.6–11.0)

## 2013-05-02 LAB — URINE CULTURE

## 2013-05-09 ENCOUNTER — Ambulatory Visit: Payer: Medicare Other | Admitting: Internal Medicine

## 2013-05-09 DIAGNOSIS — Z0289 Encounter for other administrative examinations: Secondary | ICD-10-CM

## 2013-05-10 ENCOUNTER — Telehealth: Payer: Self-pay | Admitting: Internal Medicine

## 2013-05-10 NOTE — Telephone Encounter (Signed)
Hospice is calling to make sure you're willing to cover patient while Dr.Aron's out of the office.  They'll be sending you interim orders and plan of care.

## 2013-05-10 NOTE — Telephone Encounter (Signed)
That's fine

## 2013-05-16 ENCOUNTER — Other Ambulatory Visit: Payer: Self-pay | Admitting: *Deleted

## 2013-05-16 MED ORDER — ALPRAZOLAM 1 MG PO TABS: ORAL_TABLET | ORAL | Status: AC

## 2013-05-16 NOTE — Telephone Encounter (Signed)
Ok to refill 30, 0 ref 

## 2013-05-16 NOTE — Telephone Encounter (Signed)
Called to [facility].

## 2013-05-16 NOTE — Telephone Encounter (Signed)
Received faxed refill request from pharmacy. Last office visit 01/31/13/acute. Is it okay to refill medication?

## 2013-06-02 ENCOUNTER — Telehealth: Payer: Self-pay

## 2013-06-10 ENCOUNTER — Encounter: Payer: Self-pay | Admitting: Family Medicine

## 2013-06-10 DIAGNOSIS — Z515 Encounter for palliative care: Secondary | ICD-10-CM | POA: Insufficient documentation

## 2013-06-10 HISTORY — DX: Encounter for palliative care: Z51.5

## 2013-06-16 NOTE — Telephone Encounter (Signed)
[name] with [facility] left v/m that pt died today at 3:05 PM. For Dr Elmer Sow info.No cb needed.

## 2013-06-16 DEATH — deceased

## 2013-11-09 IMAGING — CT CT CTA ABD/PEL W/CM AND/OR W/O CM
3 of 10 series · 10 of 36 positions shown, 15 images · IV contrast (omnipaque)
Comparison: COMPARISON
10/07/2012

CLINICAL DATA: Abdominal aortic aneurysm post stent graft
placement.

EXAM:
CT ANGIOGRAPHY ABDOMEN AND PELVIS
TECHNIQUE: Multidetector CT imaging of the abdomen and pelvis was performed
using the standard protocol during bolus administration of
intravenous contrast. Multiplanar reconstructed images including
MIPs were obtained and reviewed to evaluate the vascular anatomy.
CONTRAST:  80mL OMNIPAQUE IOHEXOL 350 MG/ML SOLN

[Series 5: angio 2.5 · axial · 0.70mm/px · z∈[-275,-40]mm · 4 of 158 slices shown, 9 images]
[im 32/158  soft-tissue]
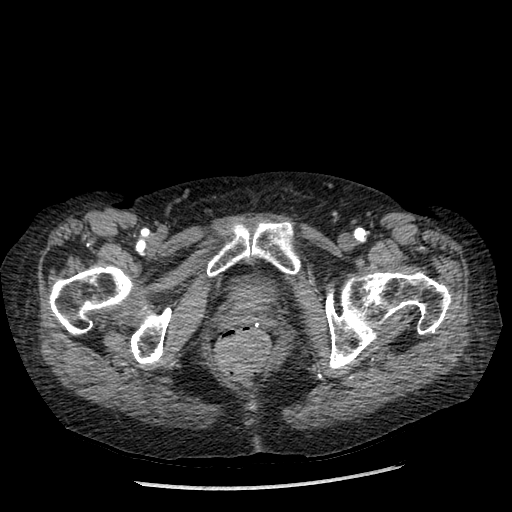
[im 32/158  lung]
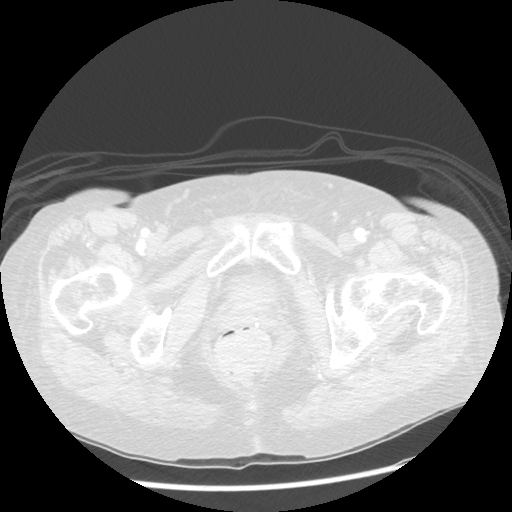
[im 32/158  bone]
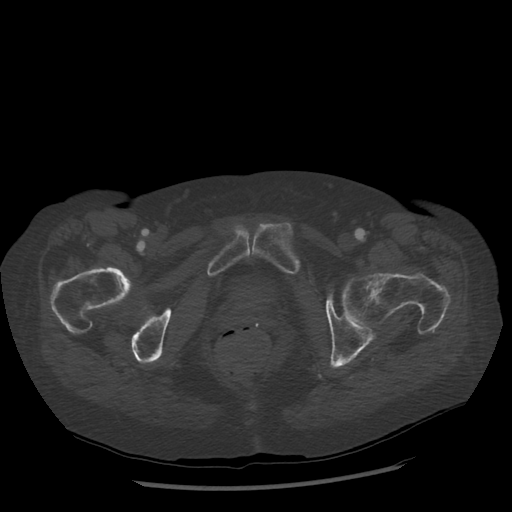
[im 63/158  soft-tissue]
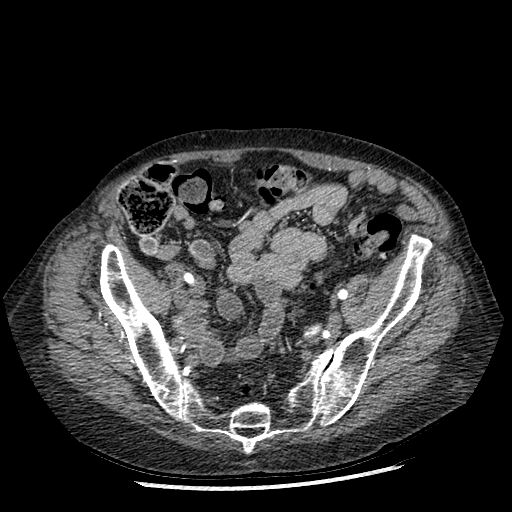
[im 63/158  lung]
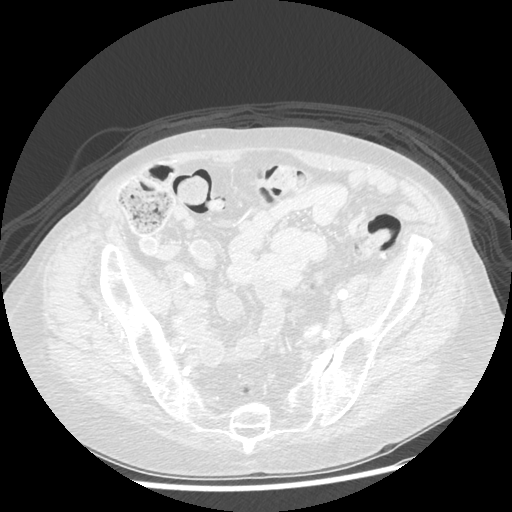
[im 95/158  soft-tissue]
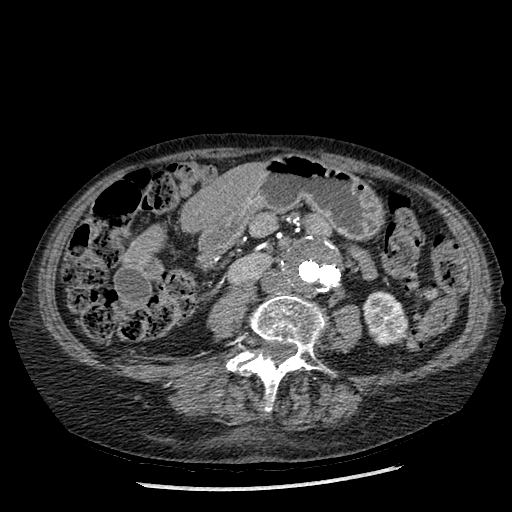
[im 95/158  lung]
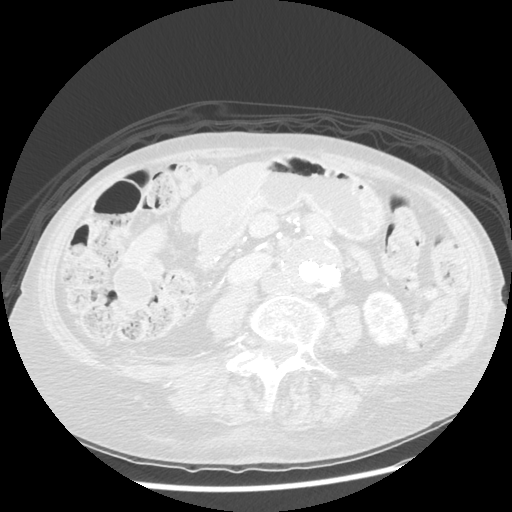
[im 126/158  soft-tissue]
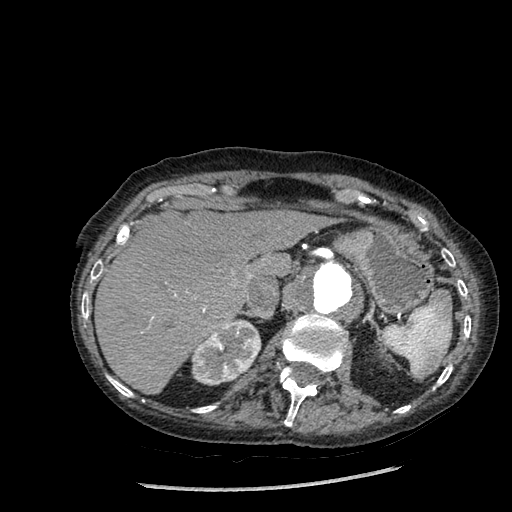
[im 126/158  lung]
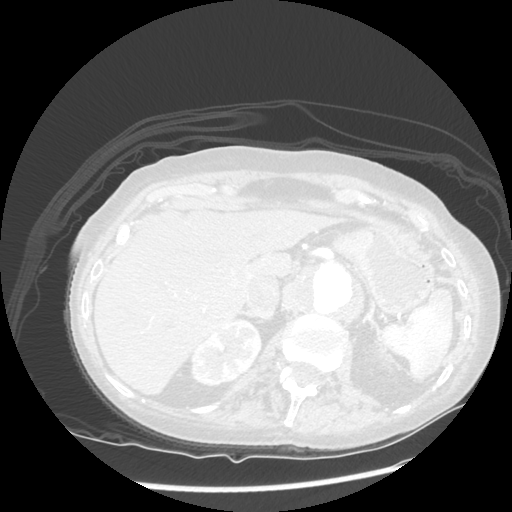

[Series 602: sagittal body · sagittal · 0.78mm/px · 3 of 145 slices shown (1 of 2)]
[im 37/145  soft-tissue]
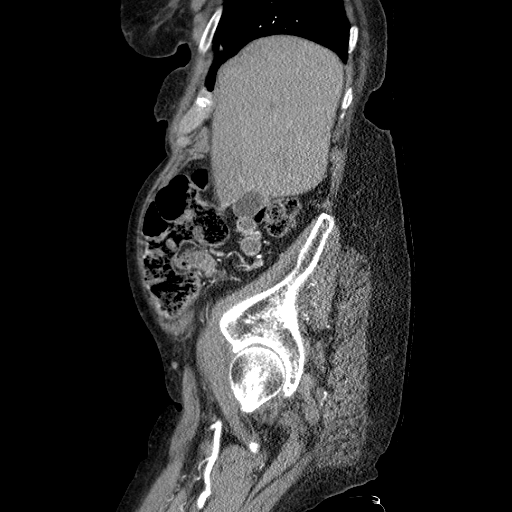
[im 73/145  soft-tissue]
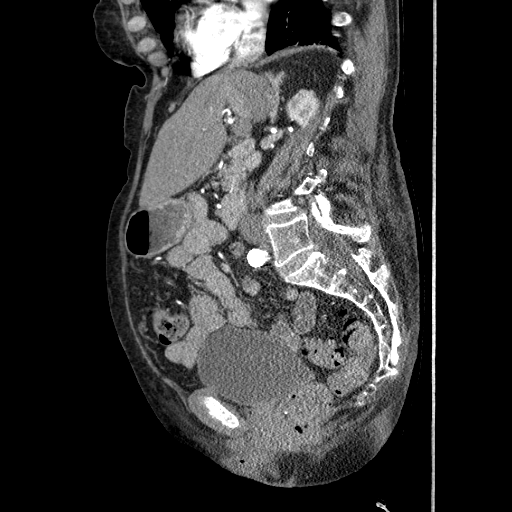
[im 109/145  soft-tissue]
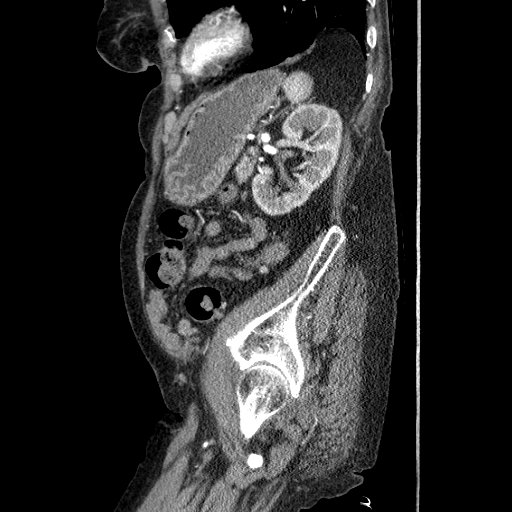

[Series 607: sagittal body · sagittal · 0.70mm/px · 3 of 142 slices shown (2 of 2)]
[im 36/142  soft-tissue]
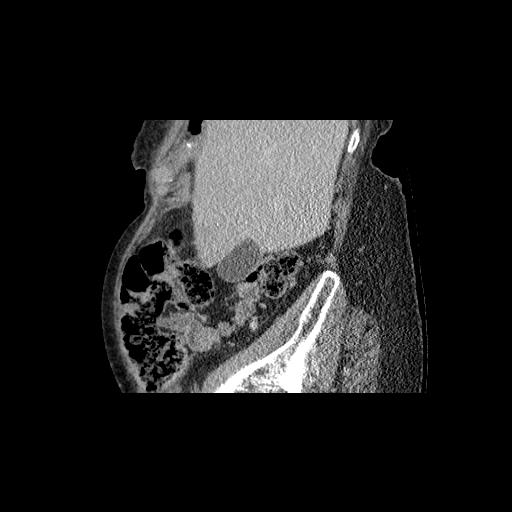
[im 71/142  soft-tissue]
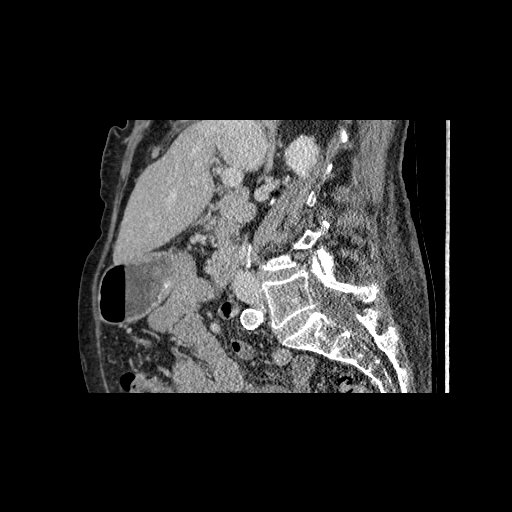
[im 106/142  soft-tissue]
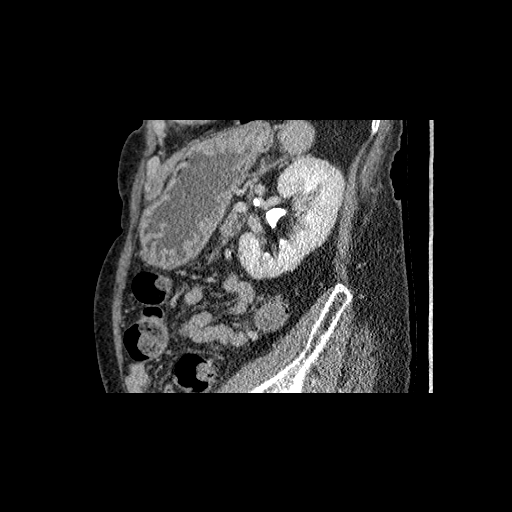

[10 of 36 positions shown; findings below may reference images not displayed]

FINDINGS: ARTERIAL FINDINGS:

Aorta: Ectatic atheromatous visualized distal descending thoracic
segment measuring up to 4.2 cm maximum transverse diameter at the
level of the diaphragm. The suprarenal abdominal aorta dilates to at
least 4.8 cm at the level of the celiac axis as before, with some
eccentric nonocclusive mural thrombus. Aorta tapers to 3.2 cm at the
level of the renal arteries. There is an infrarenal bifurcated stent
graft which remains patent. Native aneurysm sac diameter 4.3 cm,
stable. There is a persistent probable type 2 endoleak at the right
posterolateral aspect of the native aneurysm sac image [DATE].

Celiac axis: Moderate narrowing of the proximal celiac axis over a
length of at least 2.3 cm may be related to compression by median
arcuate ligament of the diaphragm and underlying aortic aneurysm. .
The celiac axis regains a normal caliber distally, with unremarkable
distal branch anatomy.

Superior mesenteric:  Patent, classic distal branch anatomy.

Left renal: Single, with partially calcified ostial plaque resulting
stenosis of less than 50% diameter, patent distally with a small 6
mm aneurysm suspected just proximal to its bifurcation into anterior
and posterior division branches.

Right renal: Single, with heavily calcified ostial plaque resulting
in a short segment stenosis of possible hemodynamic significance,
patent distally.

Inferior mesenteric: Short segment origin occlusion, reconstituted
distally by visceral collaterals.

Left iliac: Left limb of the stent graft is patent, extends to tHe
distal common iliac without endoleak. There is mild tortuosity in
the native internal and external iliac arteries without aneurysm or
stenosis.

Right iliac: Right limb of the stent graft remains patent,
terminates in the mid common iliac without endoleak. Native iliac
arterial system is tortuous without aneurysm or stenosis.

Venous findings: Patent visualized segments of the hepatic veins,
portal vein, superior mesenteric vein, splenic vein, bilateral renal
veins, IVC.

Review of the MIP images confirms the above findings.

Nonvascular findings: Visualized portions of lung bases clear.
Unremarkable liver, nondilated gallbladder, spleen, adrenal glands,
renal parenchyma. Mild pancreatic parenchymal atrophy. 6 mm calculus
in the lower pole left kidney and a smaller 3-4 mm calculus in the
lower pole right kidney. No hydronephrosis. Stomach, small bowel,
and colon are nondilated. Urinary bladder physiologically distended.
Uterus apparently surgically absent. Bilateral pelvic phleboliths.
No ascites. No free air. Spondylitic changes and levoscoliosis in
the lumbar spine. New L1 compression fracture deformity with at
least 30% loss of height anteriorly, mild retropulsion into the
spinal canal without high-grade stenosis.
IMPRESSION: 1. New L1 vertebral compression fracture deformity since previous
exam.
2. Patent infrarenal bifurcated aortic stent graft with persistent
type 2 endoleak, stable 4.3 cm native aneurysm sac diameter.
3. Thoracic aortic aneurysm, incompletely visualized.
4. Bilateral renal ostial plaque resulting in possibly significant
stenosis on the right.
5.  Stable 6 mm distal left renal artery aneurysm.
6. Bilateral nephrolithiasis without hydronephrosis.

## 2014-03-08 ENCOUNTER — Other Ambulatory Visit: Payer: Medicare Other

## 2014-10-06 NOTE — H&P (Signed)
PATIENT NAME:  [name], [name] MR#:  [id number] DATE OF BIRTH:  28-Jun-1930  DATE OF ADMISSION:  05/01/2013  PRIMARY CARE PHYSICIAN: Dr. Ruthe Mannanalia Aron  CHIEF COMPLAINT: Shortness of breath.   HISTORY OF PRESENT ILLNESS: This is an 81 year old female with a history of end-stage chronic obstructive pulmonary disease, on chronic 3 liters of oxygen and chronic prednisone. She states that she has had worsening breathing over the past two weeks and is chronically short of breath been managed with Xanax and Roxanol for her chronic shortness of breath, but over the last two weeks she has been requiring a breathing treatment every four hours. She was sent to the ER. In the ER, she was found to have a temperature of 101.3, tachycardic and chest x-ray showing pneumonia. The patient denies any cough but she has been weak and has been staggering around for the last couple of weeks. Hospitalist services were contacted for further evaluation.   PAST MEDICAL HISTORY: Arthritis, chronic respiratory failure with end-stage chronic obstructive pulmonary disease, on chronic 3 liters of oxygen and chronic prednisone, anxiety, gastroesophageal reflux disease, compression fracture with back pain.   PAST SURGICAL HISTORY: Hysterectomy lithotripsies, appendectomy, tonsillectomy, cataracts bilaterally, left shoulder replacement, triple AAA repair.   ALLERGIES: PROZAC.   [facility]. Quit smoking two years ago. Smoked for 30 years. Quit drinking 4 to 5 years ago. No drug use.   FAMILY HISTORY: Father died at [address]62 of hardening of the arteries. Mother died at 5761 of a myocardial infarction.   MEDICATIONS: Include: MS Contin 15 mg 3 times a day, MS contin 30 mg twice a day, Remeron 15 mg at bedtime, Lasix 20 mg daily, K-Dur 10 mEq daily, prednisone 10 mg daily, Xanax 1 mg twice a day 1-1/2 tabs at bedtime, Roxanol p.r.n., gabapentin 100 mg 3 times a day, Nexium daily. Senna 2 tablets by  mouth twice daily, Flonase nasal spray daily, oxygen 3 L.   REVIEW OF SYSTEMS:  CONSTITUTIONAL: Positive for fever. No chills. No sweats. Positive for weakness.  EYES: Does wear glasses.  EARS, NOSE, MOUTH, AND THROAT: Occasional dysphagia to liquids and solids. No sore throat. No hearing loss.  CARDIOVASCULAR: No chest pain. No palpitations.  RESPIRATORY: Positive for shortness of breath. No cough. No wheeze. No sputum. No hemoptysis.  GASTROINTESTINAL: No nausea. No vomiting. No abdominal pain. No diarrhea. No constipation. No bright red blood per rectum. No melena.  GENITOURINARY: No burning on urination. No hematuria.  MUSCULOSKELETAL: Positive for pains all over. Recent compression fracture.  INTEGUMENTARY: Positive for rash.  NEUROLOGIC: No fainting or blackouts but unsteady gait.  PSYCHIATRIC: On medication for anxiety.  ENDOCRINE: No thyroid problems.  HEMATOLOGIC AND LYMPHATIC: Positive for easy bruising.   PHYSICAL EXAMINATION: VITAL SIGNS: Temperature 101.3, pulse 101, respirations 18, blood pressure initially 146/59, pulse oximetry on 2 liters was 93%.  IN GENERAL: No respiratory distress.  EYES: Conjunctivae and lids normal. Pupils equal, round, and reactive to light. Extraocular muscles intact. No nystagmus.  EARS, NOSE, MOUTH, AND THROAT: Tympanic membranes: No erythema. Nasal mucosa: No erythema. Throat: No erythema. No exudate seen. Lips and gums: No lesions.  NECK: No JVD. No bruits. No lymphadenopathy. No thyromegaly. No thyroid nodules palpated.  RESPIRATORY: Decreased breath sounds bilaterally. Slight expiratory wheeze  CARDIOVASCULAR SYSTEM: S1 and S2, tachycardic. No gallops, rubs or murmurs heard. Carotid upstroke 2+ bilaterally. No bruits.  EXTREMITIES: Dorsalis pedis pulses 1+ bilaterally, 2+ edema of the lower extremity.  ABDOMEN: Soft, nontender. No organomegaly/splenomegaly. Normoactive bowel sounds. No masses felt.  LYMPHATIC: No lymph nodes in the neck.    MUSCULOSKELETAL: 2+ edema. Positive clubbing, no cyanosis.  SKIN: Positive bruising upper and lower extremities. No ulcers seen.  NEUROLOGIC: Cranial nerves II through XII grossly intact. Deep tendon reflexes are 2+ bilaterally.  PSYCHIATRIC: The patient is oriented to person, place and time.   LABORATORY AND RADIOLOGICAL DATA: EKG: Sinus tachycardia at 102 beats per minute, left atrial enlargement, RSR prime. Glucose 142, BUN 22, creatinine 1.22, sodium 144, potassium 3.7, chloride 102, CO2 of 42. Liver function tests normal range. Troponin negative. INR 0.8.   URINALYSIS: 30 mg/dL of protein.   CHEST X-RAY: Possible pneumonia, retrocardiac, small left pleural effusion.   White blood cell count 8.2, H and H 10.1 and 32.5, platelet count of 215.   ASSESSMENT AND PLAN: 1. Clinical sepsis with pneumonia, fever, tachycardia. Blood pressure when I saw her dropped down to 85 systolic, hypotension. The patient is a high risk for Pseudomonas with end-stage chronic obstructive pulmonary disease on chronic prednisone. ER physician ordered the Levaquin. I will add cefepime and continue Levaquin and cefepime combination. We will get blood cultures x 2, sputum cultures and gentle IV fluid hydration. Hold the Lasix at this time.  2. Chronic respiratory failure, end-stage chronic obstructive pulmonary disease, continue oxygen 3 liters. The patient is a DO NOT RESUSCITATE. We will give IV Solu-Medrol and nebulizer treatments. Continue Roxanol for comfort.  4. Chronic pain with compression fractures. Continue MS Contin.  5. Gastroesophageal reflux disease. Continue Nexium.  6. Anxiety, on Xanax.  7. Chronic kidney disease, stage III. Will hold the Lasix and give gentle IV fluid hydration.  TIME SPENT ON ADMISSION: 60 minutes.   CODE STATUS: THE PATIENT IS A DO NOT RESUSCITATE.    ____________________________ Herschell Dimes. Renae Gloss, MD rjw:sg D: 05/01/2013 08:03:31 ET T: 05/01/2013 09:41:50  ET JOB#: [id number]  cc: Herschell Dimes. Renae Gloss, MD, <Dictator>  Salley Scarlet MD ELECTRONICALLY SIGNED 05/03/2013 18:53

## 2014-10-06 NOTE — Discharge Summary (Signed)
PATIENT NAME:  Amanda BoroughsSTILL, Shanara M MR#:  [id number] DATE OF BIRTH:  Oct 11, 1930  DATE OF ADMISSION:  05/01/2013 DATE OF DISCHARGE:  05/04/2013   DISCHARGE DIAGNOSES: 1. Clinical sepsis with pneumonia, present on admission, improving on antibiotics.  2. Chronic respiratory failure with end-stage chronic obstructive pulmonary disease, at baseline oxygen need.  3. Chronic kidney disease, stage III, close to baseline.   SECONDARY DIAGNOSES:  1. Arthritis.  2. Chronic respiratory failure with end-stage chronic obstructive pulmonary disease, on 3 liters oxygen.  3. Anxiety.  4. Gastroesophageal reflux disease.  5. Compression fracture with back pain.   CONSULTATIONS: None.   PROCEDURES AND RADIOLOGY:  1. Chest x-ray on 16th of November showed small left pleural effusion. Possible pneumonia.  2. UA on admission was negative.  3. Blood cultures x2 were negative on 16th of November.  4. Urine culture was negative.  HISTORY AND SHORT HOSPITAL COURSE: The patient is an 81 year old female with the above-mentioned medical problems who was admitted for sepsis, likely secondary to pneumonia. Please see Dr. Mathews RobinsonsWieting's dictated history and physical for further details. The patient was started on antibiotics for pneumonia and was slowly improving. She was evaluated by physical therapy, and now she seems close to her baseline and is being [facility].  PERTINENT PHYSICAL EXAMINATION ON THE DATE OF DISCHARGE:  VITAL SIGNS: Are as follows: Temperature 98.7, heart rate 79 per minute, respirations 18 per minute, blood pressure 155/75 mmHg, she is saturating 98% to 100% on 3 liters oxygen via nasal cannula.  CARDIOVASCULAR: S1, S2 normal. No murmurs, rubs or gallops. LUNGS: Clear to auscultation bilaterally. No wheezing, rales, rhonchi or crepitation.  ABDOMEN: Soft, benign.  NEUROLOGIC: Nonfocal examination. All other physical examination remained at baseline.    DISCHARGE MEDICATIONS:  1. Lasix 20 mg p.o. daily.  2. Klor-Con 10 mEq p.o. daily. 3. Morphine 15 mg p.o. 3 times a day. 4. Alprazolam 1.5 mg p.o. at bedtime.  5. Mirtazapine 15 mg p.o. at bedtime. 6. Albuterol nebulizer inhalation every 2 hours as needed.  7. DuoNeb inhaled every 6 hours. 8. Roxanol 1 mL p.o. every 4 hours as needed. 9. Gabapentin 100 mg p.o. every 4 hours as needed.  10. MS Contin 30 mg p.o. b.i.d.  11. Nystatin swish and swallow 1 mL 3 times a day as needed.  12. Alprazolam 1 mg p.o. b.i.d.  13. Prednisone 60 mg p.o. daily, taper 10 mg daily until on 10 mg daily dose which is her daily maintainance.  14. MiraLax 17 grams once daily as needed.  15. Levofloxacin 750 mg p.o. every 48 hours for 7 doses.  16. Flonase intranasally daily. 17. Nexium 40 mg p.o. daily. 18. Tylenol 650 mg p.o. every 4 hours as needed.   DISCHARGE DIET: Regular.  DISCHARGE ACTIVITY: As tolerated.   DISCHARGE INSTRUCTIONS AND FOLLOWUP: The patient was instructed to follow up with her primary care physician in 1 to 2 weeks.    TOTAL TIME DISCHARGING THIS PATIENT: 55 minutes.  The [facility]. She uses 3 liters oxygen by nasal cannula continuously; that is a chronic need.   ____________________________ Ellamae SiaVipul S. Sherryll BurgerShah, MD vss:lb D: 05/04/2013 13:49:47 ET T: 05/04/2013 14:06:10 ET JOB#: [id number]  cc: Ciela Mahajan S. Sherryll BurgerShah, MD, <Dictator> Primary Care Physician [facility] Ellamae SiaVIPUL S [facility] MD ELECTRONICALLY SIGNED 05/05/2013 16:23
